# Patient Record
Sex: Female | Born: 1990 | Race: Black or African American | Hispanic: No | Marital: Married | Smoking: Never smoker
Health system: Southern US, Community
[De-identification: ages and names within clinical notes are randomized; demographics above are authoritative.]

## PROBLEM LIST (undated history)

## (undated) ENCOUNTER — Inpatient Hospital Stay (HOSPITAL_COMMUNITY): Payer: Self-pay

## (undated) DIAGNOSIS — K219 Gastro-esophageal reflux disease without esophagitis: Secondary | ICD-10-CM

## (undated) DIAGNOSIS — I10 Essential (primary) hypertension: Secondary | ICD-10-CM

## (undated) DIAGNOSIS — Z789 Other specified health status: Secondary | ICD-10-CM

## (undated) DIAGNOSIS — E611 Iron deficiency: Secondary | ICD-10-CM

## (undated) DIAGNOSIS — M5430 Sciatica, unspecified side: Secondary | ICD-10-CM

## (undated) DIAGNOSIS — D649 Anemia, unspecified: Secondary | ICD-10-CM

## (undated) DIAGNOSIS — Z9289 Personal history of other medical treatment: Secondary | ICD-10-CM

## (undated) HISTORY — DX: Iron deficiency: E61.1

## (undated) HISTORY — PX: WISDOM TOOTH EXTRACTION: SHX21

## (undated) HISTORY — DX: Sciatica, unspecified side: M54.30

## (undated) HISTORY — DX: Anemia, unspecified: D64.9

## (undated) HISTORY — DX: Personal history of other medical treatment: Z92.89

## (undated) HISTORY — DX: Gastro-esophageal reflux disease without esophagitis: K21.9

## (undated) HISTORY — PX: TUBAL LIGATION: SHX77

---
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
## 2009-11-25 DIAGNOSIS — A749 Chlamydial infection, unspecified: Secondary | ICD-10-CM

## 2009-11-25 HISTORY — DX: Chlamydial infection, unspecified: A74.9

## 2014-05-26 ENCOUNTER — Encounter (HOSPITAL_COMMUNITY): Payer: Self-pay | Admitting: Emergency Medicine

## 2014-05-26 ENCOUNTER — Emergency Department (HOSPITAL_COMMUNITY)
Admission: EM | Admit: 2014-05-26 | Discharge: 2014-05-26 | Disposition: A | Discharge status: Home / Self Care | Payer: BC Managed Care – PPO | Attending: Emergency Medicine | Admitting: Emergency Medicine

## 2014-05-26 DIAGNOSIS — K5289 Other specified noninfective gastroenteritis and colitis: Secondary | ICD-10-CM | POA: Insufficient documentation

## 2014-05-26 DIAGNOSIS — Z79899 Other long term (current) drug therapy: Secondary | ICD-10-CM | POA: Insufficient documentation

## 2014-05-26 DIAGNOSIS — K529 Noninfective gastroenteritis and colitis, unspecified: Secondary | ICD-10-CM

## 2014-05-26 DIAGNOSIS — Z3202 Encounter for pregnancy test, result negative: Secondary | ICD-10-CM | POA: Insufficient documentation

## 2014-05-26 LAB — URINALYSIS, ROUTINE W REFLEX MICROSCOPIC
Bilirubin Urine: NEGATIVE
Glucose, UA: NEGATIVE mg/dL
Hgb urine dipstick: NEGATIVE
Ketones, ur: NEGATIVE mg/dL
Leukocytes, UA: NEGATIVE
Nitrite: NEGATIVE
Protein, ur: 30 mg/dL — AB
Specific Gravity, Urine: 1.031 — ABNORMAL HIGH (ref 1.005–1.030)
Urobilinogen, UA: 0.2 mg/dL (ref 0.0–1.0)
pH: 6 (ref 5.0–8.0)

## 2014-05-26 LAB — CBC WITH DIFFERENTIAL/PLATELET
Basophils Absolute: 0 10*3/uL (ref 0.0–0.1)
Basophils Relative: 0 % (ref 0–1)
Eosinophils Absolute: 0 10*3/uL (ref 0.0–0.7)
Eosinophils Relative: 0 % (ref 0–5)
HCT: 37.8 % (ref 36.0–46.0)
Hemoglobin: 12.6 g/dL (ref 12.0–15.0)
Lymphocytes Relative: 11 % — ABNORMAL LOW (ref 12–46)
Lymphs Abs: 1.1 10*3/uL (ref 0.7–4.0)
MCH: 29.7 pg (ref 26.0–34.0)
MCHC: 33.3 g/dL (ref 30.0–36.0)
MCV: 89.2 fL (ref 78.0–100.0)
Monocytes Absolute: 0.6 10*3/uL (ref 0.1–1.0)
Monocytes Relative: 6 % (ref 3–12)
Neutro Abs: 8 10*3/uL — ABNORMAL HIGH (ref 1.7–7.7)
Neutrophils Relative %: 83 % — ABNORMAL HIGH (ref 43–77)
Platelets: 269 10*3/uL (ref 150–400)
RBC: 4.24 MIL/uL (ref 3.87–5.11)
RDW: 12.8 % (ref 11.5–15.5)
WBC: 9.6 10*3/uL (ref 4.0–10.5)

## 2014-05-26 LAB — POC URINE PREG, ED: Preg Test, Ur: NEGATIVE

## 2014-05-26 LAB — COMPREHENSIVE METABOLIC PANEL
ALT: 9 U/L (ref 0–35)
AST: 15 U/L (ref 0–37)
Albumin: 3.5 g/dL (ref 3.5–5.2)
Alkaline Phosphatase: 62 U/L (ref 39–117)
Anion gap: 14 (ref 5–15)
BUN: 11 mg/dL (ref 6–23)
CO2: 24 mEq/L (ref 19–32)
Calcium: 9.1 mg/dL (ref 8.4–10.5)
Chloride: 101 mEq/L (ref 96–112)
Creatinine, Ser: 0.89 mg/dL (ref 0.50–1.10)
GFR calc Af Amer: >90 mL/min (ref >90)
GFR calc non Af Amer: 89 mL/min — ABNORMAL LOW (ref >90)
Glucose, Bld: 96 mg/dL (ref 70–99)
Potassium: 4.2 mEq/L (ref 3.7–5.3)
Sodium: 139 mEq/L (ref 137–147)
Total Bilirubin: 0.3 mg/dL (ref 0.3–1.2)
Total Protein: 7.7 g/dL (ref 6.0–8.3)

## 2014-05-26 LAB — LIPASE, BLOOD: Lipase: 23 U/L (ref 11–59)

## 2014-05-26 LAB — URINE MICROSCOPIC-ADD ON

## 2014-05-26 MED ORDER — ONDANSETRON HCL 4 MG/2ML IJ SOLN
4.0000 mg | Freq: Once | INTRAMUSCULAR | Status: AC
  Administered 2014-05-26: 4 mg via INTRAVENOUS
  Filled 2014-05-26: qty 2

## 2014-05-26 MED ORDER — SODIUM CHLORIDE 0.9 % IV BOLUS (SEPSIS)
1000.0000 mL | Freq: Once | INTRAVENOUS | Status: AC
  Administered 2014-05-26: 1000 mL via INTRAVENOUS

## 2014-05-26 MED ORDER — DICYCLOMINE HCL 20 MG PO TABS: 20.0000 mg | ORAL_TABLET | Freq: Two times a day (BID) | ORAL | Status: DC

## 2014-05-26 MED ORDER — ONDANSETRON HCL 4 MG/2ML IJ SOLN: 4.0000 mg | Freq: Once | INTRAMUSCULAR | Status: DC

## 2014-05-26 MED ORDER — PROMETHAZINE HCL 25 MG PO TABS: 25.0000 mg | ORAL_TABLET | Freq: Four times a day (QID) | ORAL | Status: DC | PRN

## 2014-05-26 MED ORDER — MORPHINE SULFATE 4 MG/ML IJ SOLN
4.0000 mg | Freq: Once | INTRAMUSCULAR | Status: AC
  Administered 2014-05-26: 4 mg via INTRAVENOUS
  Filled 2014-05-26: qty 1

## 2014-05-26 NOTE — ED Provider Notes (Signed)
CSN: [id number]     Arrival date & time 05/26/14  0543 History   First MD Initiated Contact with Patient 05/26/14 0600     Chief Complaint  Patient presents with  . Emesis  . Diarrhea     (Consider location/radiation/quality/duration/timing/severity/associated sxs/prior Treatment) HPI Patient to the ER for evaluation of epigastric pain, nausea, vomiting, and diarrhea starting 1 hour after eating at a local Tavern last night. She had a chicken and philly cheese steak with fries. No one else in her group ate that meal and no one else was sick after there restaurant. She reports first having vomiting and then a few hours after starting to have diarrhea. This morning she reports no longer having diarrhea but she continues to feel nauseous and has been unable to keep down any fluids. She reports that she is healthy at baseline, takes no daily medications, and has not tried anything for her symptoms at home. Denies having abdominal pain at this time. Temp is 98.6, pulse 91, resp 16, BP 145/70, O2 is 100%  History reviewed. No pertinent past medical history. History reviewed. No pertinent past surgical history. No family history on file. History  Substance Use Topics  . Smoking status: Never Smoker   . Smokeless tobacco: Never Used  . Alcohol Use: No   OB History   Grav Para Term Preterm Abortions TAB SAB Ect Mult Living                 Review of Systems   Review of Systems  Gen: no weight loss, fevers, chills, night sweats  Eyes: no discharge or drainage, no occular pain or visual changes  Nose: no epistaxis or rhinorrhea  Mouth: no dental pain, no sore throat  Neck: no neck pain  Lungs:No wheezing, coughing or hemoptysis CV: no chest pain, palpitations, dependent edema or orthopnea  Abd: + abdominal pain, nausea, vomiting, diarrhea GU: no dysuria or gross hematuria  MSK:  No muscle weakness or pain Neuro: no headache, no focal neurologic deficits  Skin: no rash or wounds Psyche:  no complaints    Allergies  Review of patient's allergies indicates no known allergies.  Home Medications   Prior to Admission medications   Medication Sig Start Date End Date Taking? Authorizing Provider  norethindrone-ethinyl estradiol (JUNEL FE,GILDESS FE,LOESTRIN FE) 1-20 MG-MCG tablet Take 1 tablet by mouth daily.   Yes Historical Provider, MD  dicyclomine (BENTYL) 20 MG tablet Take 1 tablet (20 mg total) by mouth 2 (two) times daily. 05/26/14   Lateshia Schmoker Irine SealG Jusiah Aguayo, PA-C  promethazine (PHENERGAN) 25 MG tablet Take 1 tablet (25 mg total) by mouth every 6 (six) hours as needed for nausea or vomiting. 05/26/14   Maryon Kemnitz Irine SealG Jaleena Viviani, PA-C   BP 122/70  Pulse 80  Temp(Src) 98.6 F (37 C) (Oral)  Resp 23  Ht 5\' 4"  (1.626 m)  Wt 205 lb (92.987 kg)  BMI 35.17 kg/m2  SpO2 100%  LMP 04/15/2014 Physical Exam  Nursing note and vitals reviewed. Constitutional: She appears well-developed and well-nourished. No distress.  HENT:  Head: Normocephalic and atraumatic.  Eyes: Pupils are equal, round, and reactive to light.  Neck: Normal range of motion. Neck supple.  Cardiovascular: Normal rate and regular rhythm.   Pulmonary/Chest: Effort normal.  Abdominal: Soft. Bowel sounds are normal. She exhibits no distension, no fluid wave and no ascites. There is no tenderness. There is no rigidity, no guarding, no CVA tenderness and negative Murphy's sign.  Abdomen is soft and NT  Neurological: She is alert.  Skin: Skin is warm and dry.    ED Course  Procedures (including critical care time) Labs Review Labs Reviewed  CBC WITH DIFFERENTIAL - Abnormal; Notable for the following:    Neutrophils Relative % 83 (*)    Neutro Abs 8.0 (*)    Lymphocytes Relative 11 (*)    All other components within normal limits  COMPREHENSIVE METABOLIC PANEL - Abnormal; Notable for the following:    GFR calc non Af Amer 89 (*)    All other components within normal limits  URINALYSIS, ROUTINE W REFLEX MICROSCOPIC -  Abnormal; Notable for the following:    APPearance CLOUDY (*)    Specific Gravity, Urine 1.031 (*)    Protein, ur 30 (*)    All other components within normal limits  URINE MICROSCOPIC-ADD ON - Abnormal; Notable for the following:    Squamous Epithelial / LPF FEW (*)    All other components within normal limits  LIPASE, BLOOD  POC URINE PREG, ED    Imaging Review No results found.   EKG Interpretation None      MDM   Final diagnoses:  Gastroenteritis    Medications  sodium chloride 0.9 % bolus 1,000 mL (1,000 mLs Intravenous New Bag/Given 05/26/14 0629)  ondansetron (ZOFRAN) injection 4 mg (not administered)  ondansetron (ZOFRAN) injection 4 mg (4 mg Intravenous Given 05/26/14 0629)  morphine 4 MG/ML injection 4 mg (4 mg Intravenous Given 05/26/14 0629)   6:21 am Patients symptoms are consistent with gastroenteritis. Will initiate symptomatic control and check labs.  7:27 am Patient feeling better after the Zofran and fluids. She was given a cup of water which she has been able to drink without feeling nauseous or vomiting. She reports that she no longer feels like her stomach is turning.  Referral to PCP or GI.  dicyclomine (BENTYL) 20 MG tablet Take 1 tablet (20 mg total) by mouth 2 (two) times daily. 20 tablet Dorthula Matasiffany G Emmons Toth, PA-C   promethazine (PHENERGAN) 25 MG tablet Take 1 tablet (25 mg total) by mouth every 6 (six) hours as needed for nausea or vomiting. 30 tablet Dorthula Matasiffany G Gracelynn Bircher, PA-C  23 y.o.Nairi E Stage's evaluation in the Emergency Department is complete. It has been determined that no acute conditions requiring further emergency intervention are present at this time. The patient/guardian have been advised of the diagnosis and plan. We have discussed signs and symptoms that warrant return to the ED, such as changes or worsening in symptoms.  Vital signs are stable at discharge. Filed Vitals:   05/26/14 0700  BP: 122/70  Pulse: 80  Temp:   Resp: 23     Patient/guardian has voiced understanding and agreed to follow-up with the PCP or specialist.   Dorthula Matasiffany G Fonda Rochon, PA-C 05/26/14 [phone]

## 2014-05-26 NOTE — ED Notes (Signed)
Pt states that last night around 9p she began vomiting and having diarrhea. vomitx4 diarrheax1. States that it began after eating at [employer].

## 2014-05-26 NOTE — Discharge Instructions (Signed)

## 2014-05-26 NOTE — ED Notes (Signed)
Gave a cup of water to Patient.  Patient stated she felt better. Doing well drinking the water.

## 2014-05-30 ENCOUNTER — Encounter: Payer: Self-pay | Admitting: Internal Medicine

## 2014-05-30 NOTE — ED Provider Notes (Signed)
Medical screening examination/treatment/procedure(s) were performed by non-physician practitioner and as supervising physician I was immediately available for consultation/collaboration.   EKG Interpretation None        Shon Batonourtney F Horton, MD 05/30/14 1601

## 2014-08-03 ENCOUNTER — Ambulatory Visit: Payer: BC Managed Care – PPO | Admitting: Internal Medicine

## 2015-01-24 ENCOUNTER — Emergency Department: Payer: Self-pay | Admitting: Internal Medicine

## 2015-02-10 ENCOUNTER — Encounter (HOSPITAL_BASED_OUTPATIENT_CLINIC_OR_DEPARTMENT_OTHER): Payer: Self-pay

## 2015-02-10 ENCOUNTER — Emergency Department (HOSPITAL_BASED_OUTPATIENT_CLINIC_OR_DEPARTMENT_OTHER)
Admission: EM | Admit: 2015-02-10 | Discharge: 2015-02-10 | Disposition: A | Discharge status: Home / Self Care | Payer: Self-pay | Attending: Emergency Medicine | Admitting: Emergency Medicine

## 2015-02-10 DIAGNOSIS — Z79899 Other long term (current) drug therapy: Secondary | ICD-10-CM | POA: Insufficient documentation

## 2015-02-10 DIAGNOSIS — Z793 Long term (current) use of hormonal contraceptives: Secondary | ICD-10-CM | POA: Insufficient documentation

## 2015-02-10 DIAGNOSIS — N3001 Acute cystitis with hematuria: Secondary | ICD-10-CM | POA: Insufficient documentation

## 2015-02-10 DIAGNOSIS — Z3202 Encounter for pregnancy test, result negative: Secondary | ICD-10-CM | POA: Insufficient documentation

## 2015-02-10 LAB — URINALYSIS, ROUTINE W REFLEX MICROSCOPIC
Bilirubin Urine: NEGATIVE
Glucose, UA: NEGATIVE mg/dL
Ketones, ur: NEGATIVE mg/dL
Nitrite: NEGATIVE
Protein, ur: 30 mg/dL — AB
Specific Gravity, Urine: 1.025 (ref 1.005–1.030)
Urobilinogen, UA: 1 mg/dL (ref 0.0–1.0)
pH: 7 (ref 5.0–8.0)

## 2015-02-10 LAB — URINE MICROSCOPIC-ADD ON

## 2015-02-10 LAB — PREGNANCY, URINE: Preg Test, Ur: NEGATIVE

## 2015-02-10 MED ORDER — SULFAMETHOXAZOLE-TRIMETHOPRIM 800-160 MG PO TABS: 1.0000 | ORAL_TABLET | Freq: Two times a day (BID) | ORAL | Status: DC

## 2015-02-10 NOTE — Discharge Instructions (Signed)

## 2015-02-10 NOTE — ED Notes (Signed)
Pt reports pressure when she urinates. Denies pain. Reports frequency.

## 2015-02-10 NOTE — ED Provider Notes (Signed)
CSN: [id number]     Arrival date & time 02/10/15  1050 History   First MD Initiated Contact with Patient 02/10/15 1109     Chief Complaint  Patient presents with  . Urinary Frequency     (Consider location/radiation/quality/duration/timing/severity/associated sxs/prior Treatment) Patient is a 24 y.o. female presenting with frequency. The history is provided by the patient.  Urinary Frequency This is a new problem. Pertinent negatives include no chest pain, no abdominal pain, no headaches and no shortness of breath.   patient's had some urinary frequency for the last week or 2 and began to have some dysuria around a week ago. No fevers. States she's not had urinary tract infection before but thinks this may be what that is. No vaginal discharge. no fevers. Slight abdominal pain. She denies possibility of pregnancy. States she has had slight blood with wiping after urination.   History reviewed. No pertinent past medical history. History reviewed. No pertinent past surgical history. History reviewed. No pertinent family history. History  Substance Use Topics  . Smoking status: Never Smoker   . Smokeless tobacco: Never Used  . Alcohol Use: No   OB History    No data available     Review of Systems  Constitutional: Negative for fever and activity change.  Respiratory: Negative for chest tightness and shortness of breath.   Cardiovascular: Negative for chest pain and leg swelling.  Gastrointestinal: Negative for nausea and abdominal pain.  Genitourinary: Positive for frequency. Negative for flank pain.  Musculoskeletal: Negative for back pain.  Neurological: Negative for weakness and headaches.  Psychiatric/Behavioral: Negative for behavioral problems.      Allergies  Review of patient's allergies indicates no known allergies.  Home Medications   Prior to Admission medications   Medication Sig Start Date End Date Taking? Authorizing Provider  dicyclomine (BENTYL) 20 MG  tablet Take 1 tablet (20 mg total) by mouth 2 (two) times daily. 05/26/14   Marlon Pel, PA-C  norethindrone-ethinyl estradiol (JUNEL FE,GILDESS FE,LOESTRIN FE) 1-20 MG-MCG tablet Take 1 tablet by mouth daily.    Historical Provider, MD  promethazine (PHENERGAN) 25 MG tablet Take 1 tablet (25 mg total) by mouth every 6 (six) hours as needed for nausea or vomiting. 05/26/14   Tiffany Neva Seat, PA-C  sulfamethoxazole-trimethoprim (BACTRIM DS,SEPTRA DS) 800-160 MG per tablet Take 1 tablet by mouth 2 (two) times daily. 02/10/15   Benjiman Core, MD   BP 130/74 mmHg  Pulse 88  Temp(Src) 98.7 F (37.1 C) (Oral)  Resp 18  Ht  (1.626 m)  Wt 210 lb (95.255 kg)  BMI 36.03 kg/m2  SpO2 100%  LMP 01/09/2015 Physical Exam  Constitutional: She appears well-developed.  HENT:  Head: Normocephalic.  Cardiovascular: Normal rate and regular rhythm.   Pulmonary/Chest: Effort normal.  Abdominal: There is no tenderness.  Genitourinary:  No CVA tenderness.   Neurological: She is alert.    ED Course  Procedures (including critical care time) Labs Review Labs Reviewed  URINALYSIS, ROUTINE W REFLEX MICROSCOPIC - Abnormal; Notable for the following:    APPearance CLOUDY (*)    Hgb urine dipstick MODERATE (*)    Protein, ur 30 (*)    Leukocytes, UA LARGE (*)    All other components within normal limits  URINE MICROSCOPIC-ADD ON - Abnormal; Notable for the following:    Bacteria, UA FEW (*)    All other components within normal limits  PREGNANCY, URINE    Imaging Review No results found.   EKG Interpretation None  MDM   Final diagnoses:  Acute cystitis with hematuria    Patient with likely UTI. Will treat. Benign exam.    Benjiman CoreNathan Akisha Sturgill, MD 02/10/15 1214

## 2015-02-10 NOTE — ED Notes (Signed)
MD at bedside. 

## 2015-02-10 NOTE — ED Notes (Signed)
Pt reports urinary frequency x1 week with lower abdominal pressure.

## 2015-06-29 ENCOUNTER — Emergency Department (HOSPITAL_COMMUNITY)
Admission: EM | Admit: 2015-06-29 | Discharge: 2015-06-29 | Disposition: A | Discharge status: Home / Self Care | Payer: Self-pay | Attending: Emergency Medicine | Admitting: Emergency Medicine

## 2015-06-29 ENCOUNTER — Encounter (HOSPITAL_COMMUNITY): Payer: Self-pay | Admitting: Vascular Surgery

## 2015-06-29 DIAGNOSIS — M25471 Effusion, right ankle: Secondary | ICD-10-CM | POA: Insufficient documentation

## 2015-06-29 DIAGNOSIS — R609 Edema, unspecified: Secondary | ICD-10-CM

## 2015-06-29 DIAGNOSIS — M25475 Effusion, left foot: Secondary | ICD-10-CM | POA: Insufficient documentation

## 2015-06-29 DIAGNOSIS — Z79899 Other long term (current) drug therapy: Secondary | ICD-10-CM | POA: Insufficient documentation

## 2015-06-29 DIAGNOSIS — M25472 Effusion, left ankle: Secondary | ICD-10-CM | POA: Insufficient documentation

## 2015-06-29 DIAGNOSIS — M25474 Effusion, right foot: Secondary | ICD-10-CM | POA: Insufficient documentation

## 2015-06-29 NOTE — ED Provider Notes (Signed)
CSN: [id number]     Arrival date & time 06/29/15  2055 History  This chart was scribed for non-physician practitioner, Sharilyn Sites, PA-C, working with Glynn Octave, MD, by Ronney Lion, ED Scribe. This patient was seen in room TR03C/TR03C and the patient's care was started at 9:15 PM.    Chief Complaint  Patient presents with  . Joint Swelling   The history is provided by the patient. No language interpreter was used.    HPI Comments: Paula [name] is a 24 y.o. female who presents to the Emergency Department complaining of bilateral feet and ankle swelling that began several days ago. She states the swelling doesn't hurt, but her feet and ankles feel stiff when she stands from a sitting position.  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 She states she has had similar episodes of swelling in the past, but they usually resolve after a few days; her symptoms have been more persistent this time around. She states she has only been propping her legs up when she goes to sleep. She notes her grandmother had a history of CHF. She denies difficulty breathing or chest pain.  Patient has no hx of cardiac or renal issues.  No hx of DVT or PE.  No fever, chills, sweats.  VSS.  History reviewed. No pertinent past medical history. History reviewed. No pertinent past surgical history. No family history on file. History  Substance Use Topics  . Smoking status: Never Smoker   . Smokeless tobacco: Never Used  . Alcohol Use: No   OB History    No data available     Review of Systems  Respiratory: Negative for shortness of breath.   Cardiovascular: Positive for leg swelling. Negative for chest pain.  All other systems reviewed and are negative.  Allergies  Review of patient's allergies indicates no known allergies.  Home Medications   Prior to Admission medications   Medication Sig Start Date End Date Taking? Authorizing Provider  dicyclomine (BENTYL) 20 MG tablet Take 1  tablet (20 mg total) by mouth 2 (two) times daily. 05/26/14   Marlon Pel, PA-C  norethindrone-ethinyl estradiol (JUNEL FE,GILDESS FE,LOESTRIN FE) 1-20 MG-MCG tablet Take 1 tablet by mouth daily.    Historical Provider, MD  promethazine (PHENERGAN) 25 MG tablet Take 1 tablet (25 mg total) by mouth every 6 (six) hours as needed for nausea or vomiting. 05/26/14   Tiffany Neva Seat, PA-C  sulfamethoxazole-trimethoprim (BACTRIM DS,SEPTRA DS) 800-160 MG per tablet Take 1 tablet by mouth 2 (two) times daily. 02/10/15   Benjiman Core, MD   BP 151/70 mmHg  Pulse 78  Temp(Src) 98.2 F (36.8 C) (Oral)  Resp 16  Ht  (1.626 m)  Wt 216 lb (97.977 kg)  BMI 37.06 kg/m2  SpO2 99%  LMP 05/31/2015   Physical Exam  Constitutional: She is oriented to person, place, and time. She appears well-developed and well-nourished. No distress.  HENT:  Head: Normocephalic and atraumatic.  Mouth/Throat: Oropharynx is clear and moist.  Eyes: Conjunctivae and EOM are normal. Pupils are equal, round, and reactive to light.  Neck: Normal range of motion. Neck supple.  Cardiovascular: Normal rate, regular rhythm and normal heart sounds.   Pulmonary/Chest: Effort normal and breath sounds normal. No respiratory distress. She has no wheezes.  Musculoskeletal: Normal range of motion. She exhibits no edema.  Edema noted to bilateral ankles; no pitting; no bony deformities or signs of trauma No calf asymmetry, tenderness, or  palpable cords; no overlying skin changes or warmth to touch Both legs are NVI, legs well perfused   Neurological: She is alert and oriented to person, place, and time.  Skin: Skin is warm and dry. She is not diaphoretic.  Psychiatric: She has a normal mood and affect.  Nursing note and vitals reviewed.   ED Course  Procedures (including critical care time)  DIAGNOSTIC STUDIES: Oxygen Saturation is 99% on RA, normal by my interpretation.    COORDINATION OF CARE: 9:19 PM - Doubt blood clot or  heart failure. Suspect dependent edema. Discussed treatment plan with pt at bedside which includes periodically elevating her feet up at home to reduce swelling, and use of compression socks. Strict return precautions given, and pt instructed to return if she develops chest pain or SOB. Pt verbalized understanding and agreed to plan.     MDM   Final diagnoses:  Dependent edema   24 year old female here with bilateral ankle swelling for the past 3 days. She works at a desk daily. She states history of the same, but usually resolves the next day. She denies any chest pain, shortness of breath, calf pain, or ankle pain. No noted injuries or trauma. Edema is generalized throughout bilateral ankles, no pitting edema noted. Patient does not appear fluid overloaded.  No cardiac or renal history.  No clinical signs concerning for DVT. Both legs are neurovascularly intact and appear well-perfused. I suspect this is dependent edema.  Encouraged patient to elevate her legs throughout the day and in the evenings, also recommended compression stockings.  FU with PCP.  Discussed plan with patient, he/she acknowledged understanding and agreed with plan of care.  Return precautions given for new or worsening symptoms.  I personally performed the services described in this documentation, which was scribed in my presence. The recorded information has been reviewed and is accurate.  Garlon Hatchet, PA-C 06/29/15 2159  Glynn Octave, MD 06/29/15 [phone]

## 2015-06-29 NOTE — Discharge Instructions (Signed)
Recommend to elevate feet above level of heart. May wear compression stockings to help with swelling as well.

## 2015-06-29 NOTE — ED Notes (Signed)
Pt reports to the ED for eval of bilateral feet and ankle swelling x several days. 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 Denies any injury to her feet or legs. Swelling noted bilaterally. CMS intact. Denies any SOB or CP. No pain in her feet, legs, or ankle. She reports she has had this happen before and it will usually resolve after a few days. Denies any hx of cardiac or renal problems. Pt A&Ox4, resp e/u, and skin warm and dry.

## 2015-10-12 ENCOUNTER — Inpatient Hospital Stay (HOSPITAL_COMMUNITY)
Admission: AD | Admit: 2015-10-12 | Discharge: 2015-10-12 | Disposition: A | Discharge status: Home / Self Care | Payer: Medicaid Other | Source: Ambulatory Visit | Attending: Family Medicine | Admitting: Family Medicine

## 2015-10-12 ENCOUNTER — Inpatient Hospital Stay (HOSPITAL_COMMUNITY): Payer: Medicaid Other

## 2015-10-12 ENCOUNTER — Encounter (HOSPITAL_COMMUNITY): Payer: Self-pay | Admitting: *Deleted

## 2015-10-12 DIAGNOSIS — Z3491 Encounter for supervision of normal pregnancy, unspecified, first trimester: Secondary | ICD-10-CM

## 2015-10-12 DIAGNOSIS — R102 Pelvic and perineal pain: Secondary | ICD-10-CM | POA: Insufficient documentation

## 2015-10-12 DIAGNOSIS — Z3A13 13 weeks gestation of pregnancy: Secondary | ICD-10-CM | POA: Diagnosis not present

## 2015-10-12 DIAGNOSIS — O26891 Other specified pregnancy related conditions, first trimester: Secondary | ICD-10-CM | POA: Insufficient documentation

## 2015-10-12 LAB — HCG, QUANTITATIVE, PREGNANCY: hCG, Beta Chain, Quant, S: 2079 m[IU]/mL — ABNORMAL HIGH (ref <5)

## 2015-10-12 MED ORDER — PRENATAL VITAMINS 28-0.8 MG PO TABS: 1.0000 | ORAL_TABLET | Freq: Every day | ORAL | Status: DC

## 2015-10-12 NOTE — MAU Note (Signed)
Pt had more questions. Talked with L.Leftwich,CNM U/S ordered.

## 2015-10-12 NOTE — Discharge Instructions (Signed)
First Trimester of Pregnancy The first trimester of pregnancy is from week 1 until the end of week 12 (months 1 through 3). A week after a sperm fertilizes an egg, the egg will implant on the wall of the uterus. This embryo will begin to develop into a baby. Genes from you and your partner are forming the baby. The female genes determine whether the baby is a boy or a girl. At 6-8 weeks, the eyes and face are formed, and the heartbeat can be seen on ultrasound. At the end of 12 weeks, all the baby's organs are formed.  Now that you are pregnant, you will want to do everything you can to have a healthy baby. Two of the most important things are to get good prenatal care and to follow your health care provider's instructions. Prenatal care is all the medical care you receive before the baby's birth. This care will help prevent, find, and treat any problems during the pregnancy and childbirth. BODY CHANGES Your body goes through many changes during pregnancy. The changes vary from woman to woman.   You may gain or lose a couple of pounds at first.  You may feel sick to your stomach (nauseous) and throw up (vomit). If the vomiting is uncontrollable, call your health care provider.  You may tire easily.  You may develop headaches that can be relieved by medicines approved by your health care provider.  You may urinate more often. Painful urination may mean you have a bladder infection.  You may develop heartburn as a result of your pregnancy.  You may develop constipation because certain hormones are causing the muscles that push waste through your intestines to slow down.  You may develop hemorrhoids or swollen, bulging veins (varicose veins).  Your breasts may begin to grow larger and become tender. Your nipples may stick out more, and the tissue that surrounds them (areola) may become darker.  Your gums may bleed and may be sensitive to brushing and flossing.  Dark spots or blotches (chloasma,  mask of pregnancy) may develop on your face. This will likely fade after the baby is born.  Your menstrual periods will stop.  You may have a loss of appetite.  You may develop cravings for certain kinds of food.  You may have changes in your emotions from day to day, such as being excited to be pregnant or being concerned that something may go wrong with the pregnancy and baby.  You may have more vivid and strange dreams.  You may have changes in your hair. These can include thickening of your hair, rapid growth, and changes in texture. Some women also have hair loss during or after pregnancy, or hair that feels dry or thin. Your hair will most likely return to normal after your baby is born. WHAT TO EXPECT AT YOUR PRENATAL VISITS During a routine prenatal visit:  You will be weighed to make sure you and the baby are growing normally.  Your blood pressure will be taken.  Your abdomen will be measured to track your baby's growth.  The fetal heartbeat will be listened to starting around week 10 or 12 of your pregnancy.  Test results from any previous visits will be discussed. Your health care provider may ask you:  How you are feeling.  If you are feeling the baby move.  If you have had any abnormal symptoms, such as leaking fluid, bleeding, severe headaches, or abdominal cramping.  If you are using any tobacco products,   including cigarettes, chewing tobacco, and electronic cigarettes.  If you have any questions. Other tests that may be performed during your first trimester include:  Blood tests to find your blood type and to check for the presence of any previous infections. They will also be used to check for low iron levels (anemia) and Rh antibodies. Later in the pregnancy, blood tests for diabetes will be done along with other tests if problems develop.  Urine tests to check for infections, diabetes, or protein in the urine.  An ultrasound to confirm the proper growth  and development of the baby.  An amniocentesis to check for possible genetic problems.  Fetal screens for spina bifida and Down syndrome.  You may need other tests to make sure you and the baby are doing well.  HIV (human immunodeficiency virus) testing. Routine prenatal testing includes screening for HIV, unless you choose not to have this test. HOME CARE INSTRUCTIONS  Medicines  Follow your health care provider's instructions regarding medicine use. Specific medicines may be either safe or unsafe to take during pregnancy.  Take your prenatal vitamins as directed.  If you develop constipation, try taking a stool softener if your health care provider approves. Diet  Eat regular, well-balanced meals. Choose a variety of foods, such as meat or vegetable-based protein, fish, milk and low-fat dairy products, vegetables, fruits, and whole grain breads and cereals. Your health care provider will help you determine the amount of weight gain that is right for you.  Avoid raw meat and uncooked cheese. These carry germs that can cause birth defects in the baby.  Eating four or five small meals rather than three large meals a day may help relieve nausea and vomiting. If you start to feel nauseous, eating a few soda crackers can be helpful. Drinking liquids between meals instead of during meals also seems to help nausea and vomiting.  If you develop constipation, eat more high-fiber foods, such as fresh vegetables or fruit and whole grains. Drink enough fluids to keep your urine clear or pale yellow. Activity and Exercise  Exercise only as directed by your health care provider. Exercising will help you:  Control your weight.  Stay in shape.  Be prepared for labor and delivery.  Experiencing pain or cramping in the lower abdomen or low back is a good sign that you should stop exercising. Check with your health care provider before continuing normal exercises.  Try to avoid standing for long  periods of time. Move your legs often if you must stand in one place for a long time.  Avoid heavy lifting.  Wear low-heeled shoes, and practice good posture.  You may continue to have sex unless your health care provider directs you otherwise. Relief of Pain or Discomfort  Wear a good support bra for breast tenderness.   Take warm sitz baths to soothe any pain or discomfort caused by hemorrhoids. Use hemorrhoid cream if your health care provider approves.   Rest with your legs elevated if you have leg cramps or low back pain.  If you develop varicose veins in your legs, wear support hose. Elevate your feet for 15 minutes, 3-4 times a day. Limit salt in your diet. Prenatal Care  Schedule your prenatal visits by the twelfth week of pregnancy. They are usually scheduled monthly at first, then more often in the last 2 months before delivery.  Write down your questions. Take them to your prenatal visits.  Keep all your prenatal visits as directed by your   health care provider. Safety  Wear your seat belt at all times when driving.  Make a list of emergency phone numbers, including numbers for family, friends, the hospital, and police and fire departments. General Tips  Ask your health care provider for a referral to a local prenatal education class. Begin classes no later than at the beginning of month 6 of your pregnancy.  Ask for help if you have counseling or nutritional needs during pregnancy. Your health care provider can offer advice or refer you to specialists for help with various needs.  Do not use hot tubs, steam rooms, or saunas.  Do not douche or use tampons or scented sanitary pads.  Do not cross your legs for long periods of time.  Avoid cat litter boxes and soil used by cats. These carry germs that can cause birth defects in the baby and possibly loss of the fetus by miscarriage or stillbirth.  Avoid all smoking, herbs, alcohol, and medicines not prescribed by  your health care provider. Chemicals in these affect the formation and growth of the baby.  Do not use any tobacco products, including cigarettes, chewing tobacco, and electronic cigarettes. If you need help quitting, ask your health care provider. You may receive counseling support and other resources to help you quit.  Schedule a dentist appointment. At home, brush your teeth with a soft toothbrush and be gentle when you floss. SEEK MEDICAL CARE IF:   You have dizziness.  You have mild pelvic cramps, pelvic pressure, or nagging pain in the abdominal area.  You have persistent nausea, vomiting, or diarrhea.  You have a bad smelling vaginal discharge.  You have pain with urination.  You notice increased swelling in your face, hands, legs, or ankles. SEEK IMMEDIATE MEDICAL CARE IF:   You have a fever.  You are leaking fluid from your vagina.  You have spotting or bleeding from your vagina.  You have severe abdominal cramping or pain.  You have rapid weight gain or loss.  You vomit blood or material that looks like coffee grounds.  You are exposed to German measles and have never had them.  You are exposed to fifth disease or chickenpox.  You develop a severe headache.  You have shortness of breath.  You have any kind of trauma, such as from a fall or a car accident.   This information is not intended to replace advice given to you by your health care provider. Make sure you discuss any questions you have with your health care provider.   Document Released: 11/05/2001 Document Revised: 12/02/2014 Document Reviewed: 09/21/2013 Elsevier Interactive Patient Education 2016 [employer]  

## 2015-10-12 NOTE — MAU Provider Note (Signed)
Chief Complaint: Follow-up   None     SUBJECTIVE HPI: Paula [name] is a 24 y.o. G1P0 at [redacted]w[redacted]d by LMP who presents to maternity admissions reporting she needs a follow up quant hcg.  She presented to an emergency room in [place] 3 days ago with abdominal cramping in pregnancy, described as intermittent, mild, lower abdominal pain x 1-2 days.  She initially denied pain in MAU but after talking with her friend in the waiting room, reported she did still have mild intermittent cramping. She has not tried anything for pain. Quant hcg in [place] was 660, and she has printed documentation of her visit with lab results. She denies vaginal bleeding, vaginal itching/burning, urinary symptoms, h/a, dizziness, n/v, or fever/chills.     Abdominal Pain This is a new problem. The current episode started in the past 7 days. The onset quality is gradual. The problem occurs intermittently. The most recent episode lasted 5 days. The problem has been waxing and waning. The pain is located in the LLQ and RLQ. The pain is mild. The quality of the pain is cramping. The abdominal pain does not radiate. Pertinent negatives include no constipation, diarrhea, dysuria, fever, frequency, headaches, nausea or vomiting. Nothing aggravates the pain. The pain is relieved by nothing. She has tried nothing for the symptoms.    No past medical history on file. No past surgical history on file. Social History   Social History  . Marital Status: Single    Spouse Name: N/A  . Number of Children: N/A  . Years of Education: N/A   Occupational History  . Not on file.   Social History Main Topics  . Smoking status: Never Smoker   . Smokeless tobacco: Never Used  . Alcohol Use: No  . Drug Use: No  . Sexual Activity: Not on file   Other Topics Concern  . Not on file   Social History Narrative   No current facility-administered medications on file prior to encounter.   Current Outpatient Prescriptions on  File Prior to Encounter  Medication Sig Dispense Refill  . dicyclomine (BENTYL) 20 MG tablet Take 1 tablet (20 mg total) by mouth 2 (two) times daily. 20 tablet 0  . promethazine (PHENERGAN) 25 MG tablet Take 1 tablet (25 mg total) by mouth every 6 (six) hours as needed for nausea or vomiting. 30 tablet 0   No Known Allergies  ROS:  Review of Systems  Constitutional: Negative for fever, chills and fatigue.  HENT: Negative for sinus pressure.   Eyes: Negative for photophobia.  Respiratory: Negative for shortness of breath.   Cardiovascular: Negative for chest pain.  Gastrointestinal: Positive for abdominal pain. Negative for nausea, vomiting, diarrhea and constipation.  Genitourinary: Negative for dysuria, frequency, flank pain, vaginal bleeding, vaginal discharge, difficulty urinating, vaginal pain and pelvic pain.  Musculoskeletal: Negative for neck pain.  Neurological: Negative for dizziness, weakness and headaches.  Psychiatric/Behavioral: Negative.     I have reviewed patient's Past Medical Hx, Surgical Hx, Family Hx, Social Hx, medications and allergies.   Physical Exam   Patient Vitals for the past 24 hrs:  BP Temp Temp src Pulse Resp Height Weight  10/12/15 1040 135/60 mmHg 98.6 F (37 C) Oral 91 18  (1.626 m) 221 lb 6.4 oz (100.426 kg)   Constitutional: Well-developed, well-nourished female in no acute distress.  Cardiovascular: normal rate Respiratory: normal effort GI: Abd soft, non-tender. Pos BS x 4 MS: Extremities nontender, no edema, normal ROM Neurologic: Alert and oriented  x 4.  GU: Neg CVAT.  PELVIC EXAM: Deferred per pt  LAB RESULTS Results for orders placed or performed during the hospital encounter of 10/12/15 (from the past 24 hour(s))  hCG, quantitative, pregnancy     Status: Abnormal   Collection Time: 10/12/15 12:25 PM  Result Value Ref Range   hCG, Beta Chain, Quant, S 2079 (H) <5 mIU/mL       IMAGING [place] Ob Comp Less 14  Wks  10/12/2015  CLINICAL DATA:  Pelvic pain and cramping for 2 weeks. Gestational age by LMP of 13 weeks 0 days. EXAM: OBSTETRIC <14 WK US AND TRANSVAGINAL OB US TECHNIQUE: Both transabdominal and transvaginal ultrasound examinations were performed for complete evaluation of the gestation as well as the maternal uterus, adnexal regions, and pelvic cul-de-sac. Transvaginal technique was performed to assess early pregnancy. COMPARISON:  None. FINDINGS: Intrauterine gestational sac: Visualized/normal in shape. Located in lower portion of the endometrial cavity. Yolk sac:  Visualized Embryo:  None visualized MSD: 6  mm   5 w   2  d Maternal uterus/adnexae: A 1 cm fibroid is seen in the left posterior uterine corpus. Both ovaries are normal in appearance. No adnexal mass or free fluid identified. IMPRESSION: Single 5 week intrauterine gestational sac seen in lower portion of endometrial cavity. Recommend followup of quantitative HCG levels, and consider followup ultrasound to assess viability in 10 days. 1 cm intramural fibroid.  No adnexal mass or free fluid identified. Electronically Signed   By: Myles RosenthalJohn  Stahl M.D.   On: 10/12/2015 13:05   [place] Ob Transvaginal  10/12/2015  CLINICAL DATA:  Pelvic pain and cramping for 2 weeks. Gestational age by LMP of 13 weeks 0 days. EXAM: OBSTETRIC <14 WK US AND TRANSVAGINAL OB US TECHNIQUE: Both transabdominal and transvaginal ultrasound examinations were performed for complete evaluation of the gestation as well as the maternal uterus, adnexal regions, and pelvic cul-de-sac. Transvaginal technique was performed to assess early pregnancy. COMPARISON:  None. FINDINGS: Intrauterine gestational sac: Visualized/normal in shape. Located in lower portion of the endometrial cavity. Yolk sac:  Visualized Embryo:  None visualized MSD: 6  mm   5 w   2  d Maternal uterus/adnexae: A 1 cm fibroid is seen in the left posterior uterine corpus. Both ovaries are normal in appearance. No adnexal  mass or free fluid identified. IMPRESSION: Single 5 week intrauterine gestational sac seen in lower portion of endometrial cavity. Recommend followup of quantitative HCG levels, and consider followup ultrasound to assess viability in 10 days. 1 cm intramural fibroid.  No adnexal mass or free fluid identified. Electronically Signed   By: Myles RosenthalJohn  Stahl M.D.   On: 10/12/2015 13:05    MAU Management/MDM: Ordered labs and imaging and reviewed results.  With visible yolk sac, IUP confirmed by today's ultrasound.  Pt to f/u with early prenatal care.  Pt stable at time of discharge.  ASSESSMENT 1. Normal IUP (intrauterine pregnancy) on prenatal ultrasound, first trimester   2. Pelvic pain affecting pregnancy in first trimester, antepartum     PLAN Discharge home with first trimester pregnancy precautions F/U with early prenatal care, pt given list of providers and pregnancy verification letter Return to MAU as needed for emergencies    Medication List    STOP taking these medications        norethindrone-ethinyl estradiol 1-20 MG-MCG tablet  Commonly known as:  JUNEL FE,GILDESS FE,LOESTRIN FE     sulfamethoxazole-trimethoprim 800-160 MG tablet  Commonly known as:  BACTRIM DS,SEPTRA  DS      TAKE these medications        dicyclomine 20 MG tablet  Commonly known as:  BENTYL  Take 1 tablet (20 mg total) by mouth 2 (two) times daily.     Prenatal Vitamins 28-0.8 MG Tabs  Take 1 tablet by mouth daily.     promethazine 25 MG tablet  Commonly known as:  PHENERGAN  Take 1 tablet (25 mg total) by mouth every 6 (six) hours as needed for nausea or vomiting.       Follow-up Information    Please follow up.   Why:  Start prenatal care as soon as possible, Return to MAU as needed for emergencies      Sharen Counter Certified Nurse-Midwife 10/12/2015  5:10 PM

## 2015-10-12 NOTE — MAU Note (Signed)
Pt stated she was in int she  Emergency [place] in [place] for cramping Told her hormone level was 661. Told to follow up when when she got back to town. Pt report she is not having andy pain or bleeding at this time.

## 2015-10-13 LAB — CULTURE, OB URINE

## 2015-10-19 ENCOUNTER — Inpatient Hospital Stay (HOSPITAL_COMMUNITY): Payer: Medicaid Other

## 2015-10-19 ENCOUNTER — Encounter (HOSPITAL_COMMUNITY): Payer: Self-pay | Admitting: *Deleted

## 2015-10-19 ENCOUNTER — Inpatient Hospital Stay (HOSPITAL_COMMUNITY)
Admission: AD | Admit: 2015-10-19 | Discharge: 2015-10-19 | Disposition: A | Discharge status: Home / Self Care | Payer: Medicaid Other | Source: Ambulatory Visit | Attending: Obstetrics & Gynecology | Admitting: Obstetrics & Gynecology

## 2015-10-19 DIAGNOSIS — O209 Hemorrhage in early pregnancy, unspecified: Secondary | ICD-10-CM | POA: Diagnosis not present

## 2015-10-19 DIAGNOSIS — Z8249 Family history of ischemic heart disease and other diseases of the circulatory system: Secondary | ICD-10-CM | POA: Insufficient documentation

## 2015-10-19 DIAGNOSIS — Z3A01 Less than 8 weeks gestation of pregnancy: Secondary | ICD-10-CM | POA: Insufficient documentation

## 2015-10-19 DIAGNOSIS — O4691 Antepartum hemorrhage, unspecified, first trimester: Secondary | ICD-10-CM

## 2015-10-19 HISTORY — DX: Other specified health status: Z78.9

## 2015-10-19 LAB — URINALYSIS, ROUTINE W REFLEX MICROSCOPIC
Bilirubin Urine: NEGATIVE
Glucose, UA: NEGATIVE mg/dL
Hgb urine dipstick: NEGATIVE
Ketones, ur: NEGATIVE mg/dL
Leukocytes, UA: NEGATIVE
Nitrite: NEGATIVE
Protein, ur: NEGATIVE mg/dL
Specific Gravity, Urine: 1.025 (ref 1.005–1.030)
pH: 6.5 (ref 5.0–8.0)

## 2015-10-19 LAB — CBC
HCT: 33.8 % — ABNORMAL LOW (ref 36.0–46.0)
Hemoglobin: 11.1 g/dL — ABNORMAL LOW (ref 12.0–15.0)
MCH: 29.2 pg (ref 26.0–34.0)
MCHC: 32.8 g/dL (ref 30.0–36.0)
MCV: 88.9 fL (ref 78.0–100.0)
Platelets: 223 10*3/uL (ref 150–400)
RBC: 3.8 MIL/uL — ABNORMAL LOW (ref 3.87–5.11)
RDW: 13.6 % (ref 11.5–15.5)
WBC: 6.2 10*3/uL (ref 4.0–10.5)

## 2015-10-19 LAB — ABO/RH: ABO/RH(D): O POS

## 2015-10-19 LAB — WET PREP, GENITAL
Sperm: NONE SEEN
Trich, Wet Prep: NONE SEEN
Yeast Wet Prep HPF POC: NONE SEEN

## 2015-10-19 NOTE — MAU Provider Note (Signed)
History     CSN: [id number]  Arrival date and time: 10/19/15 0865   First Provider Initiated Contact with Patient 10/19/15 [phone]      Chief Complaint  Patient presents with  . Vaginal Bleeding   HPI Paula [name] 24 y.o. [redacted]w[redacted]d   Comes to MAU today with bleeding noted when wiping for 2 days.  Had cramping last night but no cramping today.  States that the bleeding was pink at first and is getting darker red today.  Has not yet established with a doctor.  Was in MAU on 10-12-15.  Notes, labs, and ultrasound results reviewed.  Had only cramping last week and then the bleeding started 2 days ago.  Ultrasound saw a gestational sac and yolk sac last week.  Client has not decided whether to stay in [place] or move to [place] with the father of the baby.  Plans to go to [place] next week.  Discussed the importance of establishing prenatal care and keeping her appointment.  While BP is not considered elevated, it is somewhat high for this time in pregnancy and she has a strong family history of hypertension.  OB History    Gravida Para Term Preterm AB TAB SAB Ectopic Multiple Living   1               Past Medical History  Diagnosis Date  . Medical history non-contributory     Past Surgical History  Procedure Laterality Date  . Wisdom tooth extraction      History reviewed. No pertinent family history.  Social History  Substance Use Topics  . Smoking status: Never Smoker   . Smokeless tobacco: Never Used  . Alcohol Use: No    Allergies: No Known Allergies  Prescriptions prior to admission  Medication Sig Dispense Refill Last Dose  . OVER THE COUNTER MEDICATION Take 1 tablet by mouth daily. Patient takes over the counter Prenatal Vitamins   10/18/2015 at Unknown time  . dicyclomine (BENTYL) 20 MG tablet Take 1 tablet (20 mg total) by mouth 2 (two) times daily. (Patient not taking: Reported on 10/19/2015) 20 tablet 0 Not Taking at Unknown time  . Prenatal Vit-Fe  Fumarate-FA (PRENATAL VITAMINS) 28-0.8 MG TABS Take 1 tablet by mouth daily. (Patient not taking: Reported on 10/19/2015) 30 tablet 5 Not Taking at Unknown time  . promethazine (PHENERGAN) 25 MG tablet Take 1 tablet (25 mg total) by mouth every 6 (six) hours as needed for nausea or vomiting. (Patient not taking: Reported on 10/19/2015) 30 tablet 0 Not Taking at Unknown time    Review of Systems  Constitutional: Negative for fever.  Gastrointestinal: Positive for abdominal pain. Negative for nausea, vomiting, diarrhea and constipation.  Genitourinary:       No vaginal discharge. Vaginal bleeding. No dysuria.    Physical Exam   Blood pressure 135/75, pulse 91, temperature 99.1 F (37.3 C), temperature source Oral, resp. rate 18, last menstrual period 07/13/2015.  Physical Exam  Nursing note and vitals reviewed. Constitutional: She is oriented to person, place, and time. She appears well-developed and well-nourished.  HENT:  Head: Normocephalic.  Eyes: EOM are normal.  Neck: Neck supple.  Respiratory: Effort normal.  GI: Soft. There is no tenderness.  Genitourinary:  Speculum exam: Vulva - no blood seen Vagina - Small amount of clear yellow/pink discharge Cervix - No contact bleeding, no active bleeding seen Bimanual exam: Cervix closed and thick Uterus non tender, difficult to size due to habitus Adnexa non tender GC/Chlam,  wet prep done Chaperone present for exam.  Musculoskeletal: Normal range of motion.  Neurological: She is alert and oriented to person, place, and time.  Skin: Skin is warm and dry.  Psychiatric: She has a normal mood and affect.    MAU Course  Procedures  MDM CLINICAL DATA: Vaginal bleeding  EXAM: TRANSVAGINAL OB ULTRASOUND  TECHNIQUE: Transvaginal ultrasound was performed for complete evaluation of the gestation as well as the maternal uterus, adnexal regions, and pelvic cul-de-sac.  COMPARISON: 10/12/2015  FINDINGS: Intrauterine  gestational sac: Visualized/normal in shape.  Yolk sac: Present  Embryo: Present  Cardiac Activity: Present  Heart Rate: 112 bpm  CRL: 5 mm 6 w 1 d US EDC: 06/12/2016  Maternal uterus/adnexae: Within normal limits  IMPRESSION: Single live intrauterine gestation at 6 weeks 1 day. This shows adequate progression when compared with the recent examination.   Results for orders placed or performed during the hospital encounter of 10/19/15 (from the past 24 hour(s))  Urinalysis, Routine w reflex microscopic (not at [facility])     Status: None   Collection Time: 10/19/15  8:40 AM  Result Value Ref Range   Color, Urine YELLOW YELLOW   APPearance CLEAR CLEAR   Specific Gravity, Urine 1.025 1.005 - 1.030   pH 6.5 5.0 - 8.0   Glucose, UA NEGATIVE NEGATIVE mg/dL   Hgb urine dipstick NEGATIVE NEGATIVE   Bilirubin Urine NEGATIVE NEGATIVE   Ketones, ur NEGATIVE NEGATIVE mg/dL   Protein, ur NEGATIVE NEGATIVE mg/dL   Nitrite NEGATIVE NEGATIVE   Leukocytes, UA NEGATIVE NEGATIVE  Wet prep, genital     Status: Abnormal   Collection Time: 10/19/15  9:30 AM  Result Value Ref Range   Yeast Wet Prep HPF POC NONE SEEN NONE SEEN   Trich, Wet Prep NONE SEEN NONE SEEN   Clue Cells Wet Prep HPF POC PRESENT (A) NONE SEEN   WBC, Wet Prep HPF POC MANY (A) NONE SEEN   Sperm NONE SEEN   ABO/Rh     Status: None   Collection Time: 10/19/15  9:37 AM  Result Value Ref Range   ABO/RH(D) O POS   CBC     Status: Abnormal   Collection Time: 10/19/15  9:37 AM  Result Value Ref Range   WBC 6.2 4.0 - 10.5 K/uL   RBC 3.80 (L) 3.87 - 5.11 MIL/uL   Hemoglobin 11.1 (L) 12.0 - 15.0 g/dL   HCT 16.133.8 (L) 09.636.0 - 04.546.0 %   MCV 88.9 78.0 - 100.0 fL   MCH 29.2 26.0 - 34.0 pg   MCHC 32.8 30.0 - 36.0 g/dL   RDW 40.913.6 81.111.5 - 91.415.5 %   Platelets 223 150 - 400 K/uL     Assessment and Plan  Bleeding in early pregnancy, first trimester  Plan Begin prenatal care as soon as  possible Verification of pregnancy given. Drink at least 8 8-oz glasses of water every day. Take Tylenol 325 mg 2 tablets by mouth every 4 hours if needed for pain. Return with any worsening symptoms. GC/Chlam pending.  Euline Kimbler 10/19/2015, 9:30 AM

## 2015-10-19 NOTE — Discharge Instructions (Signed)
Drink at least 8 8-oz glasses of water every day. Take Tylenol 325 mg 2 tablets by mouth every 4 hours if needed for pain. Return with any worsening symptoms.

## 2015-10-19 NOTE — MAU Note (Addendum)
Pt C/O pink discharge with wiping that started last night, she has noticed red dots in the discharge this a.m.  Denies pain.  Pt was having LLQ pain yesterday, none today.

## 2015-10-20 LAB — HIV ANTIBODY (ROUTINE TESTING W REFLEX): HIV Screen 4th Generation wRfx: NONREACTIVE

## 2015-10-20 LAB — GC/CHLAMYDIA PROBE AMP (~~LOC~~) NOT AT ARMC
Chlamydia: NEGATIVE
Neisseria Gonorrhea: NEGATIVE

## 2015-10-21 LAB — RPR: RPR Ser Ql: NONREACTIVE

## 2015-10-31 LAB — OB RESULTS CONSOLE GC/CHLAMYDIA
Chlamydia: NEGATIVE
Gonorrhea: NEGATIVE

## 2015-10-31 LAB — OB RESULTS CONSOLE RUBELLA ANTIBODY, IGM: Rubella: IMMUNE

## 2015-10-31 LAB — OB RESULTS CONSOLE RPR: RPR: NONREACTIVE

## 2015-10-31 LAB — OB RESULTS CONSOLE HEPATITIS B SURFACE ANTIGEN: Hepatitis B Surface Ag: NEGATIVE

## 2015-10-31 LAB — OB RESULTS CONSOLE HIV ANTIBODY (ROUTINE TESTING): HIV: NONREACTIVE

## 2016-01-18 ENCOUNTER — Inpatient Hospital Stay (HOSPITAL_COMMUNITY)
Admission: AD | Admit: 2016-01-18 | Discharge: 2016-01-18 | Disposition: A | Discharge status: Home / Self Care | Payer: Medicaid Other | Source: Ambulatory Visit | Attending: Obstetrics & Gynecology | Admitting: Obstetrics & Gynecology

## 2016-01-18 ENCOUNTER — Encounter (HOSPITAL_COMMUNITY): Payer: Self-pay | Admitting: *Deleted

## 2016-01-18 DIAGNOSIS — Z3A19 19 weeks gestation of pregnancy: Secondary | ICD-10-CM | POA: Diagnosis not present

## 2016-01-18 DIAGNOSIS — R51 Headache: Secondary | ICD-10-CM | POA: Diagnosis present

## 2016-01-18 DIAGNOSIS — O1202 Gestational edema, second trimester: Secondary | ICD-10-CM | POA: Diagnosis not present

## 2016-01-18 LAB — URINALYSIS, ROUTINE W REFLEX MICROSCOPIC
Bilirubin Urine: NEGATIVE
Glucose, UA: NEGATIVE mg/dL
Hgb urine dipstick: NEGATIVE
Ketones, ur: NEGATIVE mg/dL
Leukocytes, UA: NEGATIVE
Nitrite: NEGATIVE
Protein, ur: NEGATIVE mg/dL
Specific Gravity, Urine: 1.025 (ref 1.005–1.030)
pH: 6 (ref 5.0–8.0)

## 2016-01-18 NOTE — MAU Provider Note (Signed)
History    Paula [name] is a 25y.o. G1P0 at 19.2wks who presents, after phone call, for swelling and HA.  Patient reports swelling started about 2-3 weeks ago and has been ongoing.  Patient states swelling not always relieved with elevation.  Patient denies pain, visual disturbances, SOB, and issues with bp.  Patient does report being told she had "borderline bp back in November" and review of the chart shows bp 135/60 in MAU.  Patient reports current headache of 3/10.  Patient states headaches usually relieved with napping and no OTC medications taken.   There are no active problems to display for this patient.   Chief Complaint  Patient presents with  . Headache   HPI  OB History    Gravida Para Term Preterm AB TAB SAB Ectopic Multiple Living   1               Past Medical History  Diagnosis Date  . Medical history non-contributory     Past Surgical History  Procedure Laterality Date  . Wisdom tooth extraction      History reviewed. No pertinent family history.  Social History  Substance Use Topics  . Smoking status: Never Smoker   . Smokeless tobacco: Never Used  . Alcohol Use: No    Allergies: No Known Allergies  Prescriptions prior to admission  Medication Sig Dispense Refill Last Dose  . dicyclomine (BENTYL) 20 MG tablet Take 1 tablet (20 mg total) by mouth 2 (two) times daily. (Patient not taking: Reported on 10/19/2015) 20 tablet 0 Not Taking at Unknown time  . OVER THE COUNTER MEDICATION Take 1 tablet by mouth daily. Patient takes over the counter Prenatal Vitamins   10/18/2015 at Unknown time  . Prenatal Vit-Fe Fumarate-FA (PRENATAL VITAMINS) 28-0.8 MG TABS Take 1 tablet by mouth daily. (Patient not taking: Reported on 10/19/2015) 30 tablet 5 Not Taking at Unknown time  . promethazine (PHENERGAN) 25 MG tablet Take 1 tablet (25 mg total) by mouth every 6 (six) hours as needed for nausea or vomiting. (Patient not taking: Reported on 10/19/2015) 30 tablet  0 Not Taking at Unknown time    ROS  See HPI Above Physical Exam   Blood pressure 121/64, pulse 85, temperature 98.1 F (36.7 C), temperature source Oral, resp. rate 20, height 5' 3.5" (1.613 m), weight 105.802 kg (233 lb 4 oz), last menstrual period 07/13/2015.  Results for orders placed or performed during the hospital encounter of 01/18/16 (from the past 24 hour(s))  Urinalysis, Routine w reflex microscopic (not at [facility])     Status: None   Collection Time: 01/18/16  8:00 PM  Result Value Ref Range   Color, Urine YELLOW YELLOW   APPearance CLEAR CLEAR   Specific Gravity, Urine 1.025 1.005 - 1.030   pH 6.0 5.0 - 8.0   Glucose, UA NEGATIVE NEGATIVE mg/dL   Hgb urine dipstick NEGATIVE NEGATIVE   Bilirubin Urine NEGATIVE NEGATIVE   Ketones, ur NEGATIVE NEGATIVE mg/dL   Protein, ur NEGATIVE NEGATIVE mg/dL   Nitrite NEGATIVE NEGATIVE   Leukocytes, UA NEGATIVE NEGATIVE    Physical Exam  Constitutional: She is oriented to person, place, and time. She appears well-developed and well-nourished.  HENT:  Head: Normocephalic and atraumatic.  Eyes: Conjunctivae are normal.  Neck: Normal range of motion.  Cardiovascular: Normal rate.   Respiratory: Effort normal.  GI: Soft.  Musculoskeletal: She exhibits edema (.+1 Pitting).       Right ankle: She exhibits swelling. She exhibits normal  range of motion. No tenderness.       Left ankle: She exhibits swelling. She exhibits normal range of motion. No tenderness.  Neurological: She is alert and oriented to person, place, and time.  Skin: Skin is warm and dry.     FHR: 145 by doppler  ED Course  Assessment: IUP at 19.2wks Edema Headache  Plan: -Educated on edema in pregnancy and comfort measures *Increased hydration *Decreased sodium intake *Increased elevation and ambulation *Compression stockings -Educated on headaches in pregnancy *Always attempt non-pharmacologic methods for relief *Tylenol as appropriate -Encouraged to  call if any questions or concerns arise prior to next scheduled office visit.  -Keep appt as scheduled: March 1 -Discharged to home in stable condition  Cherre Robins CNM, MSN 01/18/2016 8:34 PM

## 2016-01-18 NOTE — MAU Note (Signed)
PT SAYS  SHE  HAS SWOLLEN  ANKLES  X2-3 WEEKS  -  AFTER    ELEVATING   THEY ARE STILL SWOLLEN .  CALLED OFFICE  TONIGHT - SAID WOULD  CALL  HER  TOMORROW-   BUT  SHE  CAME   WANTING  TO BE  SAFE.   SAYS ALSO HAS H/A-  STARTED  2-3  WEEKS AGO.    NOW IN TRIAGE - HAS  SLIGHT  H/A- NO MEDS  FOR H/A.     NEXT APPOINTMENT  AT [facility]  IS 3-1.

## 2016-01-18 NOTE — Discharge Instructions (Signed)
Second Trimester of Pregnancy The second trimester is from week 13 through week 28, months 4 through 6. The second trimester is often a time when you feel your best. Your body has also adjusted to being pregnant, and you begin to feel better physically. Usually, morning sickness has lessened or quit completely, you may have more energy, and you may have an increase in appetite. The second trimester is also a time when the fetus is growing rapidly. At the end of the sixth month, the fetus is about 9 inches long and weighs about 1 pounds. You will likely begin to feel the baby move (quickening) between 18 and 20 weeks of the pregnancy. BODY CHANGES Your body goes through many changes during pregnancy. The changes vary from woman to woman.   Your weight will continue to increase. You will notice your lower abdomen bulging out.  You may begin to get stretch marks on your hips, abdomen, and breasts.  You may develop headaches that can be relieved by medicines approved by your health care provider.  You may urinate more often because the fetus is pressing on your bladder.  You may develop or continue to have heartburn as a result of your pregnancy.  You may develop constipation because certain hormones are causing the muscles that push waste through your intestines to slow down.  You may develop hemorrhoids or swollen, bulging veins (varicose veins).  You may have back pain because of the weight gain and pregnancy hormones relaxing your joints between the bones in your pelvis and as a result of a shift in weight and the muscles that support your balance.  Your breasts will continue to grow and be tender.  Your gums may bleed and may be sensitive to brushing and flossing.  Dark spots or blotches (chloasma, mask of pregnancy) may develop on your face. This will likely fade after the baby is born.  A dark line from your belly button to the pubic area (linea nigra) may appear. This will likely fade  after the baby is born.  You may have changes in your hair. These can include thickening of your hair, rapid growth, and changes in texture. Some women also have hair loss during or after pregnancy, or hair that feels dry or thin. Your hair will most likely return to normal after your baby is born. WHAT TO EXPECT AT YOUR PRENATAL VISITS During a routine prenatal visit:  You will be weighed to make sure you and the fetus are growing normally.  Your blood pressure will be taken.  Your abdomen will be measured to track your baby's growth.  The fetal heartbeat will be listened to.  Any test results from the previous visit will be discussed. Your health care provider may ask you:  How you are feeling.  If you are feeling the baby move.  If you have had any abnormal symptoms, such as leaking fluid, bleeding, severe headaches, or abdominal cramping.  If you are using any tobacco products, including cigarettes, chewing tobacco, and electronic cigarettes.  If you have any questions. Other tests that may be performed during your second trimester include:  Blood tests that check for:  Low iron levels (anemia).  Gestational diabetes (between 24 and 28 weeks).  Rh antibodies.  Urine tests to check for infections, diabetes, or protein in the urine.  An ultrasound to confirm the proper growth and development of the baby.  An amniocentesis to check for possible genetic problems.  Fetal screens for spina bifida  and Down syndrome.  HIV (human immunodeficiency virus) testing. Routine prenatal testing includes screening for HIV, unless you choose not to have this test. HOME CARE INSTRUCTIONS   Avoid all smoking, herbs, alcohol, and unprescribed drugs. These chemicals affect the formation and growth of the baby.  Do not use any tobacco products, including cigarettes, chewing tobacco, and electronic cigarettes. If you need help quitting, ask your health care provider. You may receive  counseling support and other resources to help you quit.  Follow your health care provider's instructions regarding medicine use. There are medicines that are either safe or unsafe to take during pregnancy.  Exercise only as directed by your health care provider. Experiencing uterine cramps is a good sign to stop exercising.  Continue to eat regular, healthy meals.  Wear a good support bra for breast tenderness.  Do not use hot tubs, steam rooms, or saunas.  Wear your seat belt at all times when driving.  Avoid raw meat, uncooked cheese, cat litter boxes, and soil used by cats. These carry germs that can cause birth defects in the baby.  Take your prenatal vitamins.  Take 1500-2000 mg of calcium daily starting at the 20th week of pregnancy until you deliver your baby.  Try taking a stool softener (if your health care provider approves) if you develop constipation. Eat more high-fiber foods, such as fresh vegetables or fruit and whole grains. Drink plenty of fluids to keep your urine clear or pale yellow.  Take warm sitz baths to soothe any pain or discomfort caused by hemorrhoids. Use hemorrhoid cream if your health care provider approves.  If you develop varicose veins, wear support hose. Elevate your feet for 15 minutes, 3-4 times a day. Limit salt in your diet.  Avoid heavy lifting, wear low heel shoes, and practice good posture.  Rest with your legs elevated if you have leg cramps or low back pain.  Visit your dentist if you have not gone yet during your pregnancy. Use a soft toothbrush to brush your teeth and be gentle when you floss.  A sexual relationship may be continued unless your health care provider directs you otherwise.  Continue to go to all your prenatal visits as directed by your health care provider. SEEK MEDICAL CARE IF:   You have dizziness.  You have mild pelvic cramps, pelvic pressure, or nagging pain in the abdominal area.  You have persistent nausea,  vomiting, or diarrhea.  You have a bad smelling vaginal discharge.  You have pain with urination. SEEK IMMEDIATE MEDICAL CARE IF:   You have a fever.  You are leaking fluid from your vagina.  You have spotting or bleeding from your vagina.  You have severe abdominal cramping or pain.  You have rapid weight gain or loss.  You have shortness of breath with chest pain.  You notice sudden or extreme swelling of your face, hands, ankles, feet, or legs.  You have not felt your baby move in over an hour.  You have severe headaches that do not go away with medicine.  You have vision changes.   This information is not intended to replace advice given to you by your health care provider. Make sure you discuss any questions you have with your health care provider.   Document Released: 11/05/2001 Document Revised: 12/02/2014 Document Reviewed: 01/12/2013 Elsevier Interactive Patient Education 2016 [employer] Edema Edema is an abnormal buildup of fluids. It is more common in your legs and thighs. Painless swelling of the feet  and ankles is more likely as a person ages. It also is common in looser skin, like around your eyes. HOME CARE   Keep the affected body part above the level of the heart while lying down.  Do not sit still or stand for a long time.  Do not put anything right under your knees when you lie down.  Do not wear tight clothes on your upper legs.  Exercise your legs to help the puffiness (swelling) go down.  Wear elastic bandages or support stockings as told by your doctor.  A low-salt diet may help lessen the puffiness.  Only take medicine as told by your doctor. GET HELP IF:  Treatment is not working.  You have heart, liver, or kidney disease and notice that your skin looks puffy or shiny.  You have puffiness in your legs that does not get better when you raise your legs.  You have sudden weight gain for no reason. GET HELP RIGHT AWAY IF:   You  have shortness of breath or chest pain.  You cannot breathe when you lie down.  You have pain, redness, or warmth in the areas that are puffy.  You have heart, liver, or kidney disease and get edema all of a sudden.  You have a fever and your symptoms get worse all of a sudden. MAKE SURE YOU:   Understand these instructions.  Will watch your condition.  Will get help right away if you are not doing well or get worse.   This information is not intended to replace advice given to you by your health care provider. Make sure you discuss any questions you have with your health care provider.   Document Released: 04/29/2008 Document Revised: 11/16/2013 Document Reviewed: 09/03/2013 Elsevier Interactive Patient Education [employer]

## 2016-02-07 ENCOUNTER — Inpatient Hospital Stay (HOSPITAL_COMMUNITY)
Admission: AD | Admit: 2016-02-07 | Discharge: 2016-02-07 | Disposition: A | Discharge status: Home / Self Care | Payer: Medicaid Other | Source: Ambulatory Visit | Attending: Obstetrics and Gynecology | Admitting: Obstetrics and Gynecology

## 2016-02-07 DIAGNOSIS — O368120 Decreased fetal movements, second trimester, not applicable or unspecified: Secondary | ICD-10-CM | POA: Insufficient documentation

## 2016-02-07 DIAGNOSIS — O368190 Decreased fetal movements, unspecified trimester, not applicable or unspecified: Secondary | ICD-10-CM

## 2016-02-07 DIAGNOSIS — Z3A22 22 weeks gestation of pregnancy: Secondary | ICD-10-CM | POA: Insufficient documentation

## 2016-02-07 NOTE — MAU Note (Signed)
Decreased fetal movement the past 2 days.   Past wk had been feeling every couple hours, so she started getting worried when she hadn't felt it.  Since arriving, she has felt movement twice

## 2016-02-07 NOTE — MAU Provider Note (Signed)
Paula [name] is a 25 y.o. G1P0 at 22.1 weeks unsure of what movements she should feel at this state of pregnancy.  Denies vb or lof w/no ctx   History     There are no active problems to display for this patient.   Chief Complaint  Patient presents with  . Decreased Fetal Movement   HPI  OB History    Gravida Para Term Preterm AB TAB SAB Ectopic Multiple Living   1               Past Medical History  Diagnosis Date  . Medical history non-contributory     Past Surgical History  Procedure Laterality Date  . Wisdom tooth extraction      No family history on file.  Social History  Substance Use Topics  . Smoking status: Never Smoker   . Smokeless tobacco: Never Used  . Alcohol Use: No    Allergies: No Known Allergies  Prescriptions prior to admission  Medication Sig Dispense Refill Last Dose  . Prenatal Vit-Fe Fumarate-FA (PRENATAL MULTIVITAMIN) TABS tablet Take 1 tablet by mouth daily at 12 noon.   01/17/2016 at Unknown time    ROS See HPI above, all other systems are negative  Physical Exam   Blood pressure 131/60, pulse 86, temperature 98.3 F (36.8 C), temperature source Oral, resp. rate 18, weight 236 lb 3.2 oz (107.14 kg), last menstrual period 07/13/2015.  Physical Exam Ext:  WNL ABD: Soft, non tender to palpation, no rebound or guarding WNU:[id number]:deferred   ED Course  Assessment: IUP at  22.1weeks Membranes: intact FHR:154 CTX: none  Plan: -Discussed need to follow up in office in 2 weeks -Bleeding and PTL Precautions -Encouraged to call if any questions or concerns arise prior to next scheduled office visit.  -Discharged to home in stable condition    Brantleigh Mifflin, CNM, MSN 02/07/2016. 1:35 PM

## 2016-02-07 NOTE — MAU Note (Signed)
Urine sent to lab 

## 2016-02-07 NOTE — MAU Note (Signed)
Has felt more movement, pt reassured

## 2016-04-04 ENCOUNTER — Other Ambulatory Visit (HOSPITAL_COMMUNITY): Payer: Self-pay | Admitting: Advanced Practice Midwife

## 2016-04-04 DIAGNOSIS — O283 Abnormal ultrasonic finding on antenatal screening of mother: Secondary | ICD-10-CM

## 2016-04-04 DIAGNOSIS — Z3689 Encounter for other specified antenatal screening: Secondary | ICD-10-CM

## 2016-04-04 DIAGNOSIS — Z3A32 32 weeks gestation of pregnancy: Secondary | ICD-10-CM

## 2016-04-11 ENCOUNTER — Encounter (HOSPITAL_COMMUNITY): Payer: Self-pay | Admitting: Advanced Practice Midwife

## 2016-04-16 ENCOUNTER — Encounter (HOSPITAL_COMMUNITY): Payer: Self-pay

## 2016-04-17 ENCOUNTER — Ambulatory Visit (HOSPITAL_COMMUNITY)
Admission: RE | Admit: 2016-04-17 | Discharge: 2016-04-17 | Disposition: A | Discharge status: Home / Self Care | Payer: Medicaid Other | Source: Ambulatory Visit | Attending: Advanced Practice Midwife | Admitting: Advanced Practice Midwife

## 2016-04-17 ENCOUNTER — Encounter (HOSPITAL_COMMUNITY): Payer: Self-pay

## 2016-04-17 ENCOUNTER — Other Ambulatory Visit (HOSPITAL_COMMUNITY): Payer: Self-pay | Admitting: Advanced Practice Midwife

## 2016-04-17 DIAGNOSIS — O289 Unspecified abnormal findings on antenatal screening of mother: Secondary | ICD-10-CM | POA: Diagnosis not present

## 2016-04-17 DIAGNOSIS — Z3689 Encounter for other specified antenatal screening: Secondary | ICD-10-CM

## 2016-04-17 DIAGNOSIS — O283 Abnormal ultrasonic finding on antenatal screening of mother: Secondary | ICD-10-CM

## 2016-04-17 DIAGNOSIS — Z3A32 32 weeks gestation of pregnancy: Secondary | ICD-10-CM

## 2016-04-17 DIAGNOSIS — O99213 Obesity complicating pregnancy, third trimester: Secondary | ICD-10-CM | POA: Diagnosis not present

## 2016-04-17 DIAGNOSIS — Z36 Encounter for antenatal screening of mother: Secondary | ICD-10-CM | POA: Diagnosis not present

## 2016-04-17 NOTE — Progress Notes (Signed)
Genetic Counseling  Visit Summary Note  Appointment Date: 04/17/2016 Referred By: Mattie Marlinevane-Johnson,Stephanie  Date of Birth: 02/20/1991  Pregnancy history: G1P0 Estimated Date of Delivery: 06/12/16 Estimated Gestational Age: 4643w0d  [name] was seen for genetic counseling because of abnormal ultrasound findings. She was accompanied to the visit by her mother.   In summary:  Reviewed ultrasound findings and the association with fetal aneuploidy  Patient previously declined screening   Offered additional screening  NIPS - declined, but may consider later  Follow up ultrasound - scheduled  Reviewed normal screening for hemoglobinopathies  Reviewed family history concerns - none reported  [name] [facility] for ultrasound and consultation.  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  The ultrasound report will be sent under separate cover.  We discussed that the second trimester genetic sonogram is targeted at identifying features associated with aneuploidy.  It has evolved as a screening tool used to provide an individualized risk assessment for Down syndrome and other trisomies.  The ability of sonography to aid in the detection of aneuploidies relies on identification of both major structural anomalies and "soft markers."  The patient was counseled that the latter term refers to findings that are often normal variants and do not cause any significant medical problems.  Nonetheless, these markers have a known association with aneuploidy.    We discussed that SUA occurs in approximately 1 in every 200 pregnancies and is defined as the presence of one umbilical artery and one umbilical vein, instead of the typical three vessel cord containing two arteries and one vein.  We discussed that the finding of an isolated SUA (no other markers or anomalies visualized) is not thought to increase the risk for  fetal aneuploidy. However, the literature is conflicting regarding an association between this finding and congenital heart defects.  In addition, we discussed that SUA is known to be associated with an increased risk for fetal growth restriction.   We discussed that the fetal head size measures approximately 2 standard deviations below the mean. Microcephaly (small head) describes the head circumference measuring smaller than expected for age (less than the 5%tile). Prenatally, this describes the finding of the fetal head circumference measuring greater than 3 standard deviations below the expected measurement for the gestational age ([place]). Some sources further classify a measurement of 2-3 standard deviations (SD) below the mean GA as mild microcephaly. We discussed the correlation of head size to brain size. Often microcephaly may be seen secondary to a small brain, her baby's brain appears structurally normal. When small fetal head size is identified, long-term prognosis depends on both the severity of the microcephaly and the underlying cause.  [name] was counseled that isolated, mild microcephaly is most commonly a familial trait, meaning that one of the parents also has a small head.  However, we discussed the possibility of a underlying condition being responsible for the ultrasound findings.     Microcephaly can be seen as an isolated finding or more commonly may be associated with additional anomalies. Isolated forms of microcephaly have been observed to occur in families in both autosomal recessive and autosomal dominant patterns of inheritance. Microcephaly may occur secondary to differences in brain development (brain malformations or disruptions) or may be one feature in an underlying chromosome, genetic, or sporadic syndrome. Microcephaly may occur secondary to maternal exposures including intrauterine infections, alcohol, radiation, drugs, or hypoxia. Multifactorial causes (a combination of  environmental and genetic factors) may also be responsible for microcephaly in some cases. However, sometimes the underlying cause cannot be identified. All of these possibilities were discussed with the patient.   We reviewed chromosomes and the common features and variable prognosis of Down syndrome.  We also reviewed other aneuploidies including trisomies 13 and 74; however, we discussed that these conditions are less likely given that the remainder of the fetal anatomy was within normal limits by ultrasound.  We reviewed available screening and diagnostic options including noninvasive prenatal screening (NIPS)/cell free DNA (cfDNA) screening, and follow up ultrasound.  She was counseled regarding the benefits and limitations of each option. [name], declined NIPS for now, but she may consider it at a later.  She has had a normal fetal echocardiogram and will return for follow up ultrasound in 3-4 weeks.    [name] was provided with written information regarding sickle cell anemia (SCA) including the carrier frequency and incidence in the African-American population, the availability of carrier screening and prenatal diagnosis if indicated.  In addition, we discussed that hemoglobinopathies are routinely screened for as part of the [place] newborn screening panel.  She was counseled that hemoglobin electrophoresis was previously performed and reported to be normal.   Both family histories were reviewed and found to be noncontributory for birth defects, mental retardation, and known genetic conditions. Without further information regarding the provided family history, an accurate genetic risk cannot be calculated. Further genetic counseling is warranted if more information is obtained.  [name] denied exposure to environmental toxins or chemical agents. She denied the use of alcohol, tobacco or street drugs. She denied significant viral illnesses during the course of her pregnancy. Her medical and  surgical histories were noncontributory.   I counseled [name] regarding the above risks and available options.  The approximate face-to-face time with the genetic counselor was 40 minutes.  Mady Gemma, MS,  Certified Genetic Counselor

## 2016-04-18 ENCOUNTER — Other Ambulatory Visit (HOSPITAL_COMMUNITY): Payer: Self-pay

## 2016-05-02 ENCOUNTER — Other Ambulatory Visit (HOSPITAL_COMMUNITY): Payer: Self-pay | Admitting: Advanced Practice Midwife

## 2016-05-02 DIAGNOSIS — O368130 Decreased fetal movements, third trimester, not applicable or unspecified: Secondary | ICD-10-CM

## 2016-05-08 ENCOUNTER — Other Ambulatory Visit (HOSPITAL_COMMUNITY): Payer: Self-pay | Admitting: Advanced Practice Midwife

## 2016-05-08 ENCOUNTER — Ambulatory Visit (HOSPITAL_COMMUNITY)
Admission: RE | Admit: 2016-05-08 | Discharge: 2016-05-08 | Disposition: A | Discharge status: Home / Self Care | Payer: Medicaid Other | Source: Ambulatory Visit | Attending: Obstetrics and Gynecology | Admitting: Obstetrics and Gynecology

## 2016-05-08 ENCOUNTER — Encounter (HOSPITAL_COMMUNITY): Payer: Self-pay

## 2016-05-08 DIAGNOSIS — O99213 Obesity complicating pregnancy, third trimester: Secondary | ICD-10-CM | POA: Diagnosis present

## 2016-05-08 DIAGNOSIS — Z3A35 35 weeks gestation of pregnancy: Secondary | ICD-10-CM | POA: Insufficient documentation

## 2016-05-08 DIAGNOSIS — O368130 Decreased fetal movements, third trimester, not applicable or unspecified: Secondary | ICD-10-CM

## 2016-05-09 ENCOUNTER — Encounter (HOSPITAL_COMMUNITY): Payer: Self-pay

## 2016-05-09 ENCOUNTER — Other Ambulatory Visit (HOSPITAL_COMMUNITY): Payer: Self-pay

## 2016-05-20 ENCOUNTER — Inpatient Hospital Stay (HOSPITAL_COMMUNITY): Payer: Medicaid Other

## 2016-05-20 ENCOUNTER — Inpatient Hospital Stay (HOSPITAL_COMMUNITY)
Admission: AD | Admit: 2016-05-20 | Discharge: 2016-05-20 | Disposition: A | Discharge status: Home / Self Care | Payer: Medicaid Other | Source: Ambulatory Visit | Attending: Obstetrics and Gynecology | Admitting: Obstetrics and Gynecology

## 2016-05-20 ENCOUNTER — Encounter (HOSPITAL_COMMUNITY): Payer: Self-pay | Admitting: *Deleted

## 2016-05-20 DIAGNOSIS — O4703 False labor before 37 completed weeks of gestation, third trimester: Secondary | ICD-10-CM | POA: Diagnosis not present

## 2016-05-20 DIAGNOSIS — O133 Gestational [pregnancy-induced] hypertension without significant proteinuria, third trimester: Secondary | ICD-10-CM | POA: Diagnosis not present

## 2016-05-20 DIAGNOSIS — Z3A36 36 weeks gestation of pregnancy: Secondary | ICD-10-CM | POA: Diagnosis not present

## 2016-05-20 DIAGNOSIS — R109 Unspecified abdominal pain: Secondary | ICD-10-CM | POA: Diagnosis present

## 2016-05-20 DIAGNOSIS — IMO0002 Reserved for concepts with insufficient information to code with codable children: Secondary | ICD-10-CM

## 2016-05-20 DIAGNOSIS — O479 False labor, unspecified: Secondary | ICD-10-CM

## 2016-05-20 DIAGNOSIS — M7989 Other specified soft tissue disorders: Secondary | ICD-10-CM | POA: Diagnosis present

## 2016-05-20 LAB — CBC
HCT: 29.7 % — ABNORMAL LOW (ref 36.0–46.0)
Hemoglobin: 9.9 g/dL — ABNORMAL LOW (ref 12.0–15.0)
MCH: 30.3 pg (ref 26.0–34.0)
MCHC: 33.3 g/dL (ref 30.0–36.0)
MCV: 90.8 fL (ref 78.0–100.0)
Platelets: 213 10*3/uL (ref 150–400)
RBC: 3.27 MIL/uL — ABNORMAL LOW (ref 3.87–5.11)
RDW: 14.4 % (ref 11.5–15.5)
WBC: 7.8 10*3/uL (ref 4.0–10.5)

## 2016-05-20 LAB — LACTATE DEHYDROGENASE: LDH: 119 U/L (ref 98–192)

## 2016-05-20 LAB — PROTEIN / CREATININE RATIO, URINE
Creatinine, Urine: 176 mg/dL
Protein Creatinine Ratio: 0.26 mg/mg{Cre} — ABNORMAL HIGH (ref 0.00–0.15)
Total Protein, Urine: 45 mg/dL

## 2016-05-20 LAB — URINALYSIS, ROUTINE W REFLEX MICROSCOPIC
Bilirubin Urine: NEGATIVE
Glucose, UA: NEGATIVE mg/dL
Hgb urine dipstick: NEGATIVE
Ketones, ur: NEGATIVE mg/dL
Leukocytes, UA: NEGATIVE
Nitrite: NEGATIVE
Protein, ur: NEGATIVE mg/dL
Specific Gravity, Urine: >1.03 — ABNORMAL HIGH (ref 1.005–1.030)
pH: 6 (ref 5.0–8.0)

## 2016-05-20 LAB — COMPREHENSIVE METABOLIC PANEL
ALT: 17 U/L (ref 14–54)
AST: 18 U/L (ref 15–41)
Albumin: 2.8 g/dL — ABNORMAL LOW (ref 3.5–5.0)
Alkaline Phosphatase: 115 U/L (ref 38–126)
Anion gap: 5 (ref 5–15)
BUN: 9 mg/dL (ref 6–20)
CO2: 27 mmol/L (ref 22–32)
Calcium: 9.2 mg/dL (ref 8.9–10.3)
Chloride: 106 mmol/L (ref 101–111)
Creatinine, Ser: 0.75 mg/dL (ref 0.44–1.00)
GFR calc Af Amer: >60 mL/min (ref >60)
GFR calc non Af Amer: >60 mL/min (ref >60)
Glucose, Bld: 75 mg/dL (ref 65–99)
Potassium: 4.1 mmol/L (ref 3.5–5.1)
Sodium: 138 mmol/L (ref 135–145)
Total Bilirubin: 0.5 mg/dL (ref 0.3–1.2)
Total Protein: 6.8 g/dL (ref 6.5–8.1)

## 2016-05-20 LAB — URIC ACID: Uric Acid, Serum: 6 mg/dL (ref 2.3–6.6)

## 2016-05-20 MED ORDER — ACETAMINOPHEN 325 MG PO TABS
650.0000 mg | ORAL_TABLET | Freq: Once | ORAL | Status: AC
  Administered 2016-05-20: 650 mg via ORAL
  Filled 2016-05-20: qty 2

## 2016-05-20 NOTE — MAU Note (Signed)
Pt C/O edema in feet & ankles for the past week, HA started today, lower back pain, pelvic pressure - since Saturday.  Denies bleeding or LOF.

## 2016-05-20 NOTE — Discharge Instructions (Signed)
Braxton Hicks Contractions °Contractions of the uterus can occur throughout pregnancy. Contractions are not always a sign that you are in labor.  °WHAT ARE BRAXTON HICKS CONTRACTIONS?  °Contractions that occur before labor are called Braxton Hicks contractions, or false labor. Toward the end of pregnancy (32-34 weeks), these contractions can develop more often and may become more forceful. This is not true labor because these contractions do not result in opening (dilatation) and thinning of the cervix. They are sometimes difficult to tell apart from true labor because these contractions can be forceful and people have different pain tolerances. You should not feel embarrassed if you go to the hospital with false labor. Sometimes, the only way to tell if you are in true labor is for your health care provider to look for changes in the cervix. °If there are no prenatal problems or other health problems associated with the pregnancy, it is completely safe to be sent home with false labor and await the onset of true labor. °HOW CAN YOU TELL THE DIFFERENCE BETWEEN TRUE AND FALSE LABOR? °False Labor °· The contractions of false labor are usually shorter and not as hard as those of true labor.   °· The contractions are usually irregular.   °· The contractions are often felt in the front of the lower abdomen and in the groin.   °· The contractions may go away when you walk around or change positions while lying down.   °· The contractions get weaker and are shorter lasting as time goes on.   °· The contractions do not usually become progressively stronger, regular, and closer together as with true labor.   °True Labor °1. Contractions in true labor last 30-70 seconds, become very regular, usually become more intense, and increase in frequency.   °2. The contractions do not go away with walking.   °3. The discomfort is usually felt in the top of the uterus and spreads to the lower abdomen and low back.   °4. True labor can  be determined by your health care provider with an exam. This will show that the cervix is dilating and getting thinner.   °WHAT TO REMEMBER °· Keep up with your usual exercises and follow other instructions given by your health care provider.   °· Take medicines as directed by your health care provider.   °· Keep your regular prenatal appointments.   °· Eat and drink lightly if you think you are going into labor.   °· If Braxton Hicks contractions are making you uncomfortable:   °· Change your position from lying down or resting to walking, or from walking to resting.   °· Sit and rest in a tub of warm water.   °· Drink 2-3 glasses of water. Dehydration may cause these contractions.   °· Do slow and deep breathing several times an hour.   °WHEN SHOULD I SEEK IMMEDIATE MEDICAL CARE? °Seek immediate medical care if: °· Your contractions become stronger, more regular, and closer together.   °· You have fluid leaking or gushing from your vagina.   °· You have a fever.   °· You pass blood-tinged mucus.   °· You have vaginal bleeding.   °· You have continuous abdominal pain.   °· You have low back pain that you never had before.   °· You feel your baby's head pushing down and causing pelvic pressure.   °· Your baby is not moving as much as it used to.   °  °This information is not intended to replace advice given to you by your health care provider. Make sure you discuss any questions you have with your health care   provider. °  °Document Released: 11/11/2005 Document Revised: 11/16/2013 Document Reviewed: 08/23/2013 °Elsevier Interactive Patient Education ©2016 [employer] ° °Fetal Movement Counts °Patient Name: __________________________________________________ Patient Due Date: ____________________ °Performing a fetal movement count is highly recommended in high-risk pregnancies, but it is good for every pregnant woman to do. Your health care provider may ask you to start counting fetal movements at 28 weeks of the  pregnancy. Fetal movements often increase: °· After eating a full meal. °· After physical activity. °· After eating or drinking something sweet or cold. °· At rest. °Pay attention to when you feel the baby is most active. This will help you notice a pattern of your baby's sleep and wake cycles and what factors contribute to an increase in fetal movement. It is important to perform a fetal movement count at the same time each day when your baby is normally most active.  °HOW TO COUNT FETAL MOVEMENTS °5. Find a quiet and comfortable area to sit or lie down on your left side. Lying on your left side provides the best blood and oxygen circulation to your baby. °6. Write down the day and time on a sheet of paper or in a journal. °7. Start counting kicks, flutters, swishes, rolls, or jabs in a 2-hour period. You should feel at least 10 movements within 2 hours. °8. If you do not feel 10 movements in 2 hours, wait 2-3 hours and count again. Look for a change in the pattern or not enough counts in 2 hours. °SEEK MEDICAL CARE IF: °· You feel less than 10 counts in 2 hours, tried twice. °· There is no movement in over an hour. °· The pattern is changing or taking longer each day to reach 10 counts in 2 hours. °· You feel the baby is not moving as he or she usually does. °Date: ____________ Movements: ____________ Start time: ____________ Finish time: ____________  °Date: ____________ Movements: ____________ Start time: ____________ Finish time: ____________ °Date: ____________ Movements: ____________ Start time: ____________ Finish time: ____________ °Date: ____________ Movements: ____________ Start time: ____________ Finish time: ____________ °Date: ____________ Movements: ____________ Start time: ____________ Finish time: ____________ °Date: ____________ Movements: ____________ Start time: ____________ Finish time: ____________ °Date: ____________ Movements: ____________ Start time: ____________ Finish time:  ____________ °Date: ____________ Movements: ____________ Start time: ____________ Finish time: ____________  °Date: ____________ Movements: ____________ Start time: ____________ Finish time: ____________ °Date: ____________ Movements: ____________ Start time: ____________ Finish time: ____________ °Date: ____________ Movements: ____________ Start time: ____________ Finish time: ____________ °Date: ____________ Movements: ____________ Start time: ____________ Finish time: ____________ °Date: ____________ Movements: ____________ Start time: ____________ Finish time: ____________ °Date: ____________ Movements: ____________ Start time: ____________ Finish time: ____________ °Date: ____________ Movements: ____________ Start time: ____________ Finish time: ____________  °Date: ____________ Movements: ____________ Start time: ____________ Finish time: ____________ °Date: ____________ Movements: ____________ Start time: ____________ Finish time: ____________ °Date: ____________ Movements: ____________ Start time: ____________ Finish time: ____________ °Date: ____________ Movements: ____________ Start time: ____________ Finish time: ____________ °Date: ____________ Movements: ____________ Start time: ____________ Finish time: ____________ °Date: ____________ Movements: ____________ Start time: ____________ Finish time: ____________ °Date: ____________ Movements: ____________ Start time: ____________ Finish time: ____________  °Date: ____________ Movements: ____________ Start time: ____________ Finish time: ____________ °Date: ____________ Movements: ____________ Start time: ____________ Finish time: ____________ °Date: ____________ Movements: ____________ Start time: ____________ Finish time: ____________ °Date: ____________ Movements: ____________ Start time: ____________ Finish time: ____________ °Date: ____________ Movements: ____________ Start time: ____________ Finish time: ____________ °Date: ____________ Movements:  ____________ Start time: ____________ Finish   time: ____________ °Date: ____________ Movements: ____________ Start time: ____________ Finish time: ____________  °Date: ____________ Movements: ____________ Start time: ____________ Finish time: ____________ °Date: ____________ Movements: ____________ Start time: ____________ Finish time: ____________ °Date: ____________ Movements: ____________ Start time: ____________ Finish time: ____________ °Date: ____________ Movements: ____________ Start time: ____________ Finish time: ____________ °Date: ____________ Movements: ____________ Start time: ____________ Finish time: ____________ °Date: ____________ Movements: ____________ Start time: ____________ Finish time: ____________ °Date: ____________ Movements: ____________ Start time: ____________ Finish time: ____________  °Date: ____________ Movements: ____________ Start time: ____________ Finish time: ____________ °Date: ____________ Movements: ____________ Start time: ____________ Finish time: ____________ °Date: ____________ Movements: ____________ Start time: ____________ Finish time: ____________ °Date: ____________ Movements: ____________ Start time: ____________ Finish time: ____________ °Date: ____________ Movements: ____________ Start time: ____________ Finish time: ____________ °Date: ____________ Movements: ____________ Start time: ____________ Finish time: ____________ °Date: ____________ Movements: ____________ Start time: ____________ Finish time: ____________  °Date: ____________ Movements: ____________ Start time: ____________ Finish time: ____________ °Date: ____________ Movements: ____________ Start time: ____________ Finish time: ____________ °Date: ____________ Movements: ____________ Start time: ____________ Finish time: ____________ °Date: ____________ Movements: ____________ Start time: ____________ Finish time: ____________ °Date: ____________ Movements: ____________ Start time: ____________ Finish  time: ____________ °Date: ____________ Movements: ____________ Start time: ____________ Finish time: ____________ °Date: ____________ Movements: ____________ Start time: ____________ Finish time: ____________  °Date: ____________ Movements: ____________ Start time: ____________ Finish time: ____________ °Date: ____________ Movements: ____________ Start time: ____________ Finish time: ____________ °Date: ____________ Movements: ____________ Start time: ____________ Finish time: ____________ °Date: ____________ Movements: ____________ Start time: ____________ Finish time: ____________ °Date: ____________ Movements: ____________ Start time: ____________ Finish time: ____________ °Date: ____________ Movements: ____________ Start time: ____________ Finish time: ____________ °  °This information is not intended to replace advice given to you by your health care provider. Make sure you discuss any questions you have with your health care provider. °  °Document Released: 12/11/2006 Document Revised: 12/02/2014 Document Reviewed: 09/07/2012 °Elsevier Interactive Patient Education ©2016 [employer] Inc. ° °

## 2016-05-20 NOTE — MAU Provider Note (Signed)
History     CSN: [id number]  Arrival date and time: 05/20/16 1703   First Provider Initiated Contact with Patient 05/20/16 1821      Chief Complaint  Patient presents with  . Leg Swelling  . Headache  . Abdominal Pain   HPI Paula [name] is a [age] y.o. G1P0 at 4464w5d who presents with lower extremity swelling, headache, & abdominal pain.  Reports headache since this morning. Rates pain 6/10. Has not treated. Has noticed lower extremity for the last several weeks; wears compression hose occasionally. Denies vision changes, epigastric pain, CP, or SOB.   Some lower abdominal pain & tightening. Has occurred 3 times today. Denies LOF or vaginal bleeding. Positive fetal movement.  Denies hx of hypertension.   OB History    Gravida Para Term Preterm AB TAB SAB Ectopic Multiple Living   1               Past Medical History  Diagnosis Date  . Medical history non-contributory     Past Surgical History  Procedure Laterality Date  . Wisdom tooth extraction      History reviewed. No pertinent family history.  Social History  Substance Use Topics  . Smoking status: Never Smoker   . Smokeless tobacco: Never Used  . Alcohol Use: No    Allergies: No Known Allergies  Prescriptions prior to admission  Medication Sig Dispense Refill Last Dose  . IRON PO Take 1 tablet by mouth daily.    Past Week at Unknown time  . Prenatal Vit-Fe Fumarate-FA (PRENATAL MULTIVITAMIN) TABS tablet Take 1 tablet by mouth daily at 12 noon.   05/20/2016 at Unknown time    Review of Systems  Constitutional: Negative.   Eyes: Negative for blurred vision and photophobia.  Respiratory: Negative.   Cardiovascular: Positive for leg swelling. Negative for chest pain.  Gastrointestinal: Positive for abdominal pain. Negative for nausea, vomiting, diarrhea and constipation.  Genitourinary: Negative.   Neurological: Positive for headaches.   Physical Exam   Blood pressure 137/74, pulse 91,  temperature 97.8 F (36.6 C), temperature source Oral, resp. rate 18, last menstrual period 07/13/2015.  Patient Vitals for the past 24 hrs:  BP Temp Temp src Pulse Resp  05/20/16 1832 131/77 mmHg - - 92 -  05/20/16 1817 128/67 mmHg - - 92 -  05/20/16 1802 134/67 mmHg - - 96 -  05/20/16 1754 144/70 mmHg - - 94 -  05/20/16 1729 137/74 mmHg 97.8 F (36.6 C) Oral 91 18    Physical Exam  Nursing note and vitals reviewed. Constitutional: She is oriented to person, place, and time. She appears well-developed and well-nourished. No distress.  HENT:  Head: Normocephalic and atraumatic.  Eyes: Conjunctivae are normal. Right eye exhibits no discharge. Left eye exhibits no discharge. No scleral icterus.  Neck: Normal range of motion.  Cardiovascular: Normal rate, regular rhythm and normal heart sounds.   No murmur heard. Respiratory: Effort normal and breath sounds normal. No respiratory distress. She has no wheezes.  GI: Soft.  Musculoskeletal: She exhibits edema (BLE, 2+).  Neurological: She is alert and oriented to person, place, and time. She has normal reflexes.  No clonus  Skin: Skin is warm and dry. She is not diaphoretic.  Psychiatric: She has a normal mood and affect. Her behavior is normal. Judgment and thought content normal.    MAU Course  Procedures Results for orders placed or performed during the hospital encounter of 05/20/16 (from the past 24 hour(s))  Urinalysis, Routine w reflex microscopic (not [facility])     Status: Abnormal   Collection Time: 05/20/16  5:35 PM  Result Value Ref Range   Color, Urine YELLOW YELLOW   APPearance CLEAR CLEAR   Specific Gravity, Urine >1.030 (H) 1.005 - 1.030   pH 6.0 5.0 - 8.0   Glucose, UA NEGATIVE NEGATIVE mg/dL   Hgb urine dipstick NEGATIVE NEGATIVE   Bilirubin Urine NEGATIVE NEGATIVE   Ketones, ur NEGATIVE NEGATIVE mg/dL   Protein, ur NEGATIVE NEGATIVE mg/dL   Nitrite NEGATIVE NEGATIVE   Leukocytes, UA NEGATIVE NEGATIVE   Protein / creatinine ratio, urine     Status: Abnormal   Collection Time: 05/20/16  5:35 PM  Result Value Ref Range   Creatinine, Urine 176.00 mg/dL   Total Protein, Urine 45 mg/dL   Protein Creatinine Ratio 0.26 (H) 0.00 - 0.15 mg/mg[Cre]  CBC     Status: Abnormal   Collection Time: 05/20/16  6:44 PM  Result Value Ref Range   WBC 7.8 4.0 - 10.5 K/uL   RBC 3.27 (L) 3.87 - 5.11 MIL/uL   Hemoglobin 9.9 (L) 12.0 - 15.0 g/dL   HCT 16.129.7 (L) 09.636.0 - 04.546.0 %   MCV 90.8 78.0 - 100.0 fL   MCH 30.3 26.0 - 34.0 pg   MCHC 33.3 30.0 - 36.0 g/dL   RDW 40.914.4 81.111.5 - 91.415.5 %   Platelets 213 150 - 400 K/uL  Comprehensive metabolic panel     Status: Abnormal   Collection Time: 05/20/16  6:44 PM  Result Value Ref Range   Sodium 138 135 - 145 mmol/L   Potassium 4.1 3.5 - 5.1 mmol/L   Chloride 106 101 - 111 mmol/L   CO2 27 22 - 32 mmol/L   Glucose, Bld 75 65 - 99 mg/dL   BUN 9 6 - 20 mg/dL   Creatinine, Ser 7.820.75 0.44 - 1.00 mg/dL   Calcium 9.2 8.9 - 95.610.3 mg/dL   Total Protein 6.8 6.5 - 8.1 g/dL   Albumin 2.8 (L) 3.5 - 5.0 g/dL   AST 18 15 - 41 U/L   ALT 17 14 - 54 U/L   Alkaline Phosphatase 115 38 - 126 U/L   Total Bilirubin 0.5 0.3 - 1.2 mg/dL   GFR calc non Af Amer >60 >60 mL/min   GFR calc Af Amer >60 >60 mL/min   Anion gap 5 5 - 15  Lactate dehydrogenase     Status: None   Collection Time: 05/20/16  6:44 PM  Result Value Ref Range   LDH 119 98 - 192 U/L  Uric acid     Status: None   Collection Time: 05/20/16  6:44 PM  Result Value Ref Range   Uric Acid, Serum 6.0 2.3 - 6.6 mg/dL    MDM Reactive tracing No severe range BPs Tylenol 650 mg PO Fetal deceleration -- lasted just under 2 minutes -- 2 small variables after -- pt sent for BPP Normal HDP labs BPP 8/8, normal AFI S/w Dr. Richardson Doppole -- Ok to discharge home, keep f/u on Wednesday Assessment and Plan  A: 1. Braxton Hick's contraction   2. Fetal heart deceleration   3. [redacted] weeks gestation of pregnancy   4. Transient hypertension  of pregnancy in third trimester     P: Discharge home Discussed reasons to return to MAU Keep f/u with OB Increase water intake & wear compression hose daily   Judeth Hornrin Margia Wiesen 05/20/2016, 6:18 PM

## 2016-05-28 ENCOUNTER — Encounter (HOSPITAL_COMMUNITY): Payer: Self-pay | Admitting: *Deleted

## 2016-05-28 ENCOUNTER — Encounter (HOSPITAL_COMMUNITY)
Admission: AD | Disposition: A | Discharge status: Home / Self Care | Payer: Self-pay | Source: Ambulatory Visit | Attending: Obstetrics and Gynecology

## 2016-05-28 ENCOUNTER — Inpatient Hospital Stay (HOSPITAL_COMMUNITY)
Admission: AD | Admit: 2016-05-28 | Discharge: 2016-05-31 | DRG: 765 | Disposition: A | Discharge status: Home / Self Care | Payer: Medicaid Other | Source: Ambulatory Visit | Attending: Obstetrics and Gynecology | Admitting: Obstetrics and Gynecology

## 2016-05-28 ENCOUNTER — Inpatient Hospital Stay (HOSPITAL_COMMUNITY): Payer: Medicaid Other | Admitting: Anesthesiology

## 2016-05-28 DIAGNOSIS — O99214 Obesity complicating childbirth: Secondary | ICD-10-CM | POA: Diagnosis present

## 2016-05-28 DIAGNOSIS — Z98891 History of uterine scar from previous surgery: Secondary | ICD-10-CM | POA: Diagnosis present

## 2016-05-28 DIAGNOSIS — Z3A37 37 weeks gestation of pregnancy: Secondary | ICD-10-CM

## 2016-05-28 DIAGNOSIS — O4593 Premature separation of placenta, unspecified, third trimester: Secondary | ICD-10-CM | POA: Diagnosis present

## 2016-05-28 DIAGNOSIS — O9962 Diseases of the digestive system complicating childbirth: Secondary | ICD-10-CM | POA: Diagnosis not present

## 2016-05-28 DIAGNOSIS — K219 Gastro-esophageal reflux disease without esophagitis: Secondary | ICD-10-CM | POA: Diagnosis present

## 2016-05-28 DIAGNOSIS — O9081 Anemia of the puerperium: Secondary | ICD-10-CM | POA: Diagnosis not present

## 2016-05-28 DIAGNOSIS — O358XX0 Maternal care for other (suspected) fetal abnormality and damage, not applicable or unspecified: Secondary | ICD-10-CM | POA: Diagnosis not present

## 2016-05-28 DIAGNOSIS — Z6841 Body Mass Index (BMI) 40.0 and over, adult: Secondary | ICD-10-CM

## 2016-05-28 DIAGNOSIS — D62 Acute posthemorrhagic anemia: Secondary | ICD-10-CM | POA: Diagnosis not present

## 2016-05-28 DIAGNOSIS — IMO0001 Reserved for inherently not codable concepts without codable children: Secondary | ICD-10-CM

## 2016-05-28 LAB — TYPE AND SCREEN
ABO/RH(D): O POS
Antibody Screen: NEGATIVE

## 2016-05-28 LAB — URINALYSIS, ROUTINE W REFLEX MICROSCOPIC
Bilirubin Urine: NEGATIVE
Glucose, UA: NEGATIVE mg/dL
Ketones, ur: NEGATIVE mg/dL
Leukocytes, UA: NEGATIVE
Nitrite: NEGATIVE
Protein, ur: 30 mg/dL — AB
Specific Gravity, Urine: 1.015 (ref 1.005–1.030)
pH: 6 (ref 5.0–8.0)

## 2016-05-28 LAB — CBC
HCT: 30.7 % — ABNORMAL LOW (ref 36.0–46.0)
Hemoglobin: 10.2 g/dL — ABNORMAL LOW (ref 12.0–15.0)
MCH: 29.8 pg (ref 26.0–34.0)
MCHC: 33.2 g/dL (ref 30.0–36.0)
MCV: 89.8 fL (ref 78.0–100.0)
Platelets: 221 10*3/uL (ref 150–400)
RBC: 3.42 MIL/uL — ABNORMAL LOW (ref 3.87–5.11)
RDW: 14.5 % (ref 11.5–15.5)
WBC: 8.7 10*3/uL (ref 4.0–10.5)

## 2016-05-28 LAB — COMPREHENSIVE METABOLIC PANEL
ALT: 19 U/L (ref 14–54)
AST: 19 U/L (ref 15–41)
Albumin: 2.6 g/dL — ABNORMAL LOW (ref 3.5–5.0)
Alkaline Phosphatase: 135 U/L — ABNORMAL HIGH (ref 38–126)
Anion gap: 7 (ref 5–15)
BUN: 6 mg/dL (ref 6–20)
CO2: 22 mmol/L (ref 22–32)
Calcium: 8.8 mg/dL — ABNORMAL LOW (ref 8.9–10.3)
Chloride: 106 mmol/L (ref 101–111)
Creatinine, Ser: 0.57 mg/dL (ref 0.44–1.00)
GFR calc Af Amer: >60 mL/min (ref >60)
GFR calc non Af Amer: >60 mL/min (ref >60)
Glucose, Bld: 71 mg/dL (ref 65–99)
Potassium: 3.8 mmol/L (ref 3.5–5.1)
Sodium: 135 mmol/L (ref 135–145)
Total Bilirubin: 0.6 mg/dL (ref 0.3–1.2)
Total Protein: 6.3 g/dL — ABNORMAL LOW (ref 6.5–8.1)

## 2016-05-28 LAB — URINE MICROSCOPIC-ADD ON

## 2016-05-28 SURGERY — Surgical Case
Anesthesia: Epidural

## 2016-05-28 MED ORDER — TERBUTALINE SULFATE 1 MG/ML IJ SOLN
0.2500 mg | Freq: Once | INTRAMUSCULAR | Status: AC
  Administered 2016-05-28: 0.25 mg via SUBCUTANEOUS

## 2016-05-28 MED ORDER — ONDANSETRON HCL 4 MG/2ML IJ SOLN: 4.0000 mg | Freq: Four times a day (QID) | INTRAMUSCULAR | Status: DC | PRN

## 2016-05-28 MED ORDER — LACTATED RINGERS IV SOLN: 500.0000 mL | Freq: Once | INTRAVENOUS | Status: DC

## 2016-05-28 MED ORDER — OXYTOCIN 10 UNIT/ML IJ SOLN
40.0000 [IU] | INTRAVENOUS | Status: DC | PRN
  Administered 2016-05-28: 40 [IU] via INTRAVENOUS

## 2016-05-28 MED ORDER — SOD CITRATE-CITRIC ACID 500-334 MG/5ML PO SOLN
30.0000 mL | ORAL | Status: DC | PRN
  Administered 2016-05-28: 30 mL via ORAL
  Filled 2016-05-28: qty 15

## 2016-05-28 MED ORDER — OXYCODONE-ACETAMINOPHEN 5-325 MG PO TABS: 2.0000 | ORAL_TABLET | ORAL | Status: DC | PRN

## 2016-05-28 MED ORDER — OXYTOCIN BOLUS FROM INFUSION: 500.0000 mL | INTRAVENOUS | Status: DC

## 2016-05-28 MED ORDER — LACTATED RINGERS IV SOLN
INTRAVENOUS | Status: DC
  Administered 2016-05-28 (×2): via INTRAVENOUS

## 2016-05-28 MED ORDER — PHENYLEPHRINE 40 MCG/ML (10ML) SYRINGE FOR IV PUSH (FOR BLOOD PRESSURE SUPPORT): 80.0000 ug | PREFILLED_SYRINGE | INTRAVENOUS | Status: DC | PRN

## 2016-05-28 MED ORDER — BUPIVACAINE HCL (PF) 0.25 % IJ SOLN
INTRAMUSCULAR | Status: AC
  Filled 2016-05-28: qty 20

## 2016-05-28 MED ORDER — OXYTOCIN 10 UNIT/ML IJ SOLN
INTRAMUSCULAR | Status: AC
  Filled 2016-05-28: qty 4

## 2016-05-28 MED ORDER — EPHEDRINE 5 MG/ML INJ: 10.0000 mg | INTRAVENOUS | Status: DC | PRN

## 2016-05-28 MED ORDER — MORPHINE SULFATE (PF) 0.5 MG/ML IJ SOLN
INTRAMUSCULAR | Status: DC | PRN
  Administered 2016-05-28: 4 mg via EPIDURAL

## 2016-05-28 MED ORDER — LIDOCAINE HCL (PF) 1 % IJ SOLN: 30.0000 mL | INTRAMUSCULAR | Status: DC | PRN

## 2016-05-28 MED ORDER — SCOPOLAMINE 1 MG/3DAYS TD PT72
MEDICATED_PATCH | TRANSDERMAL | Status: AC
  Filled 2016-05-28: qty 1

## 2016-05-28 MED ORDER — LACTATED RINGERS IV SOLN
INTRAVENOUS | Status: DC | PRN
  Administered 2016-05-28: 22:00:00 via INTRAVENOUS

## 2016-05-28 MED ORDER — LACTATED RINGERS IV SOLN
INTRAVENOUS | Status: DC
  Administered 2016-05-28: 17:00:00 via INTRAUTERINE

## 2016-05-28 MED ORDER — LIDOCAINE-EPINEPHRINE (PF) 2 %-1:200000 IJ SOLN
INTRAMUSCULAR | Status: DC | PRN
  Administered 2016-05-28 (×2): 5 mL via EPIDURAL

## 2016-05-28 MED ORDER — LACTATED RINGERS IV SOLN
500.0000 mL | INTRAVENOUS | Status: DC | PRN
  Administered 2016-05-28 (×2): 500 mL via INTRAVENOUS

## 2016-05-28 MED ORDER — BUPIVACAINE HCL (PF) 0.25 % IJ SOLN
INTRAMUSCULAR | Status: DC | PRN
  Administered 2016-05-28: 20 mL

## 2016-05-28 MED ORDER — MORPHINE SULFATE (PF) 0.5 MG/ML IJ SOLN
INTRAMUSCULAR | Status: AC
  Filled 2016-05-28: qty 10

## 2016-05-28 MED ORDER — OXYTOCIN 40 UNITS IN LACTATED RINGERS INFUSION - SIMPLE MED: 2.5000 [IU]/h | INTRAVENOUS | Status: DC

## 2016-05-28 MED ORDER — ONDANSETRON HCL 4 MG/2ML IJ SOLN
INTRAMUSCULAR | Status: DC | PRN
  Administered 2016-05-28: 4 mg via INTRAVENOUS

## 2016-05-28 MED ORDER — ACETAMINOPHEN 325 MG PO TABS: 650.0000 mg | ORAL_TABLET | ORAL | Status: DC | PRN

## 2016-05-28 MED ORDER — PHENYLEPHRINE 40 MCG/ML (10ML) SYRINGE FOR IV PUSH (FOR BLOOD PRESSURE SUPPORT)
80.0000 ug | PREFILLED_SYRINGE | INTRAVENOUS | Status: DC | PRN
  Filled 2016-05-28: qty 10

## 2016-05-28 MED ORDER — SODIUM BICARBONATE 8.4 % IV SOLN
INTRAVENOUS | Status: AC
  Filled 2016-05-28: qty 50

## 2016-05-28 MED ORDER — FENTANYL 2.5 MCG/ML BUPIVACAINE 1/10 % EPIDURAL INFUSION (WH - ANES)
14.0000 mL/h | INTRAMUSCULAR | Status: DC | PRN
  Administered 2016-05-28: 14 mL/h via EPIDURAL
  Filled 2016-05-28: qty 125

## 2016-05-28 MED ORDER — LACTATED RINGERS IV SOLN: INTRAVENOUS | Status: DC

## 2016-05-28 MED ORDER — LIDOCAINE HCL (PF) 1 % IJ SOLN
INTRAMUSCULAR | Status: DC | PRN
  Administered 2016-05-28 (×2): 4 mL via EPIDURAL

## 2016-05-28 MED ORDER — SCOPOLAMINE 1 MG/3DAYS TD PT72
MEDICATED_PATCH | TRANSDERMAL | Status: DC | PRN
  Administered 2016-05-28: 1 via TRANSDERMAL

## 2016-05-28 MED ORDER — OXYCODONE-ACETAMINOPHEN 5-325 MG PO TABS: 1.0000 | ORAL_TABLET | ORAL | Status: DC | PRN

## 2016-05-28 MED ORDER — TERBUTALINE SULFATE 1 MG/ML IJ SOLN
INTRAMUSCULAR | Status: AC
  Filled 2016-05-28: qty 1

## 2016-05-28 MED ORDER — FENTANYL CITRATE (PF) 100 MCG/2ML IJ SOLN: 50.0000 ug | INTRAMUSCULAR | Status: DC | PRN

## 2016-05-28 MED ORDER — DEXTROSE 5 % IV SOLN
3.0000 g | Freq: Once | INTRAVENOUS | Status: AC
  Administered 2016-05-28: 3 g via INTRAVENOUS
  Filled 2016-05-28: qty 3000

## 2016-05-28 MED ORDER — DIPHENHYDRAMINE HCL 50 MG/ML IJ SOLN: 12.5000 mg | INTRAMUSCULAR | Status: DC | PRN

## 2016-05-28 MED ORDER — LIDOCAINE-EPINEPHRINE (PF) 2 %-1:200000 IJ SOLN
INTRAMUSCULAR | Status: AC
  Filled 2016-05-28: qty 20

## 2016-05-28 MED ORDER — LACTATED RINGERS IV SOLN
INTRAVENOUS | Status: DC | PRN
  Administered 2016-05-28 (×3): via INTRAVENOUS

## 2016-05-28 MED ORDER — ONDANSETRON HCL 4 MG/2ML IJ SOLN
INTRAMUSCULAR | Status: AC
  Filled 2016-05-28: qty 2

## 2016-05-28 SURGICAL SUPPLY — 34 items
BENZOIN TINCTURE PRP APPL 2/3 (GAUZE/BANDAGES/DRESSINGS) ×2 IMPLANT
BOOTIES KNEE HIGH SLOAN (MISCELLANEOUS) ×4 IMPLANT
CLAMP CORD UMBIL (MISCELLANEOUS) IMPLANT
CLOTH BEACON ORANGE TIMEOUT ST (SAFETY) ×2 IMPLANT
DRAIN JACKSON PRT FLT 10 (DRAIN) IMPLANT
DRSG OPSITE POSTOP 4X10 (GAUZE/BANDAGES/DRESSINGS) ×2 IMPLANT
DURAPREP 26ML APPLICATOR (WOUND CARE) ×2 IMPLANT
ELECT REM PT RETURN 9FT ADLT (ELECTROSURGICAL) ×2
ELECTRODE REM PT RTRN 9FT ADLT (ELECTROSURGICAL) ×1 IMPLANT
EVACUATOR SILICONE 100CC (DRAIN) IMPLANT
EXTRACTOR VACUUM M CUP 4 TUBE (SUCTIONS) IMPLANT
GLOVE BIOGEL PI IND STRL 7.0 (GLOVE) ×2 IMPLANT
GLOVE BIOGEL PI INDICATOR 7.0 (GLOVE) ×2
GLOVE ECLIPSE 6.5 STRL STRAW (GLOVE) ×2 IMPLANT
GOWN STRL REUS W/TWL LRG LVL3 (GOWN DISPOSABLE) ×4 IMPLANT
KIT ABG SYR 3ML LUER SLIP (SYRINGE) ×2 IMPLANT
NEEDLE HYPO 22GX1.5 SAFETY (NEEDLE) ×2 IMPLANT
NEEDLE HYPO 25X5/8 SAFETYGLIDE (NEEDLE) ×2 IMPLANT
NS IRRIG 1000ML POUR BTL (IV SOLUTION) ×4 IMPLANT
PACK C SECTION WH (CUSTOM PROCEDURE TRAY) ×2 IMPLANT
PAD OB MATERNITY 4.3X12.25 (PERSONAL CARE ITEMS) ×2 IMPLANT
PENCIL SMOKE EVAC W/HOLSTER (ELECTROSURGICAL) ×2 IMPLANT
RTRCTR C-SECT PINK 25CM LRG (MISCELLANEOUS) ×2 IMPLANT
STRIP CLOSURE SKIN 1/2X4 (GAUZE/BANDAGES/DRESSINGS) ×2 IMPLANT
SUT MNCRL AB 3-0 PS2 27 (SUTURE) ×2 IMPLANT
SUT SILK 2 0 FSL 18 (SUTURE) IMPLANT
SUT VIC AB 0 CTX 36 (SUTURE) ×2
SUT VIC AB 0 CTX36XBRD ANBCTRL (SUTURE) ×2 IMPLANT
SUT VIC AB 1 CT1 36 (SUTURE) ×4 IMPLANT
SUT VIC AB 2-0 CT1 27 (SUTURE)
SUT VIC AB 2-0 CT1 TAPERPNT 27 (SUTURE) IMPLANT
SYR 20CC LL (SYRINGE) ×2 IMPLANT
TOWEL OR 17X24 6PK STRL BLUE (TOWEL DISPOSABLE) ×2 IMPLANT
TRAY FOLEY CATH SILVER 14FR (SET/KITS/TRAYS/PACK) ×2 IMPLANT

## 2016-05-28 NOTE — Anesthesia Postprocedure Evaluation (Signed)
Anesthesia Post Note  Patient: Paula [name]  Procedure(s) Performed: Procedure(s) (LRB): CESAREAN SECTION (N/A)  Patient location during evaluation: PACU Anesthesia Type: Epidural Level of consciousness: awake and alert and oriented Pain management: pain level controlled Vital Signs Assessment: post-procedure vital signs reviewed and stable Respiratory status: spontaneous breathing, nonlabored ventilation and respiratory function stable Cardiovascular status: blood pressure returned to baseline and stable Postop Assessment: no signs of nausea or vomiting, patient able to bend at knees, epidural receding, no backache and no headache Anesthetic complications: no     Last Vitals:  Filed Vitals:   05/28/16 2300 05/28/16 2315  BP: 128/109 144/58  Pulse: 117 113  Temp: 37.3 C   Resp:  21    Last Pain:  Filed Vitals:   05/28/16 2334  PainSc: 0-No pain   Pain Goal: Patients Stated Pain Goal: 6 (05/28/16 1304)               Sherie Dobrowolski A.

## 2016-05-28 NOTE — Progress Notes (Signed)
Patient ID: Paula [name], female   DOB: 04/20/1991, 25 y.o.   MRN: [id number]   Called to room by RN.  Dr. Estanislado Pandyivard in Cesarean Section and would like IUPC placed.  Pt consented for procedure.  IUPC placed without complication.  Cervical exam still 4 cm.   [name],[name], MD

## 2016-05-28 NOTE — Progress Notes (Signed)
Dr. Estanislado Pandyivard notified of pt in MAU.  Notified that pt is a G1P0 at 3754w6d.  Notified that pt came in with complaints of bleeding and contractions that started at 1000 this morning.  Notified that bleeding has collected on her underwear and there were a few clots in the toilet.  Notified that pt had a streak on her underwear that was just worn from the house to the hospital.  Provider states to check the pt's cervix and report back.

## 2016-05-28 NOTE — Progress Notes (Signed)
Assuming care of Paula [name], 25 yo G1P0 @ 37.6 wks admitted in latent labor. Family at bedside.  Subjective: Comfortable w/ epidural. Feeling intermittent vaginal pressure. Continues to leak blood tinged fluid.   Objective: BP 126/74 mmHg  Pulse 76  Temp(Src) 98.9 F (37.2 C) (Oral)  Resp 18  Ht 5\' 4"  (1.626 m)  Wt 115.214 kg (254 lb)  BMI 43.58 kg/m2  SpO2 99%  LMP 07/13/2015 Today's Vitals   05/28/16 1730 05/28/16 1731 05/28/16 1801 05/28/16 1859  BP:  124/71 112/75 126/74  Pulse:  80 92 76  Temp:      TempSrc:      Resp:    18  Height:      Weight:      SpO2: 99%     PainSc:  0-No pain     BPs 125-150/71-92 between 12:14-16:40. CMP and CBC normal. Pt asymptomatic. Results for orders placed or performed during the hospital encounter of 05/28/16 (from the past 24 hour(s))  Urinalysis, Routine w reflex microscopic (not at [facility])     Status: Abnormal   Collection Time: 05/28/16 12:05 PM  Result Value Ref Range   Color, Urine YELLOW YELLOW   APPearance CLEAR CLEAR   Specific Gravity, Urine 1.015 1.005 - 1.030   pH 6.0 5.0 - 8.0   Glucose, UA NEGATIVE NEGATIVE mg/dL   Hgb urine dipstick LARGE (A) NEGATIVE   Bilirubin Urine NEGATIVE NEGATIVE   Ketones, ur NEGATIVE NEGATIVE mg/dL   Protein, ur 30 (A) NEGATIVE mg/dL   Nitrite NEGATIVE NEGATIVE   Leukocytes, UA NEGATIVE NEGATIVE  Urine microscopic-add on     Status: Abnormal   Collection Time: 05/28/16 12:05 PM  Result Value Ref Range   Squamous Epithelial / LPF 0-5 (A) NONE SEEN   WBC, UA 0-5 0 - 5 WBC/hpf   RBC / HPF 6-30 0 - 5 RBC/hpf   Bacteria, UA FEW (A) NONE SEEN  Comprehensive metabolic panel     Status: Abnormal   Collection Time: 05/28/16  1:45 PM  Result Value Ref Range   Sodium 135 135 - 145 mmol/L   Potassium 3.8 3.5 - 5.1 mmol/L   Chloride 106 101 - 111 mmol/L   CO2 22 22 - 32 mmol/L   Glucose, Bld 71 65 - 99 mg/dL   BUN 6 6 - 20 mg/dL   Creatinine, Ser 9.600.57 0.44 - 1.00 mg/dL   Calcium 8.8 (L)  8.9 - 10.3 mg/dL   Total Protein 6.3 (L) 6.5 - 8.1 g/dL   Albumin 2.6 (L) 3.5 - 5.0 g/dL   AST 19 15 - 41 U/L   ALT 19 14 - 54 U/L   Alkaline Phosphatase 135 (H) 38 - 126 U/L   Total Bilirubin 0.6 0.3 - 1.2 mg/dL   GFR calc non Af Amer >60 >60 mL/min   GFR calc Af Amer >60 >60 mL/min   Anion gap 7 5 - 15  CBC     Status: Abnormal   Collection Time: 05/28/16  1:45 PM  Result Value Ref Range   WBC 8.7 4.0 - 10.5 K/uL   RBC 3.42 (L) 3.87 - 5.11 MIL/uL   Hemoglobin 10.2 (L) 12.0 - 15.0 g/dL   HCT 45.430.7 (L) 09.836.0 - 11.946.0 %   MCV 89.8 78.0 - 100.0 fL   MCH 29.8 26.0 - 34.0 pg   MCHC 33.2 30.0 - 36.0 g/dL   RDW 14.714.5 82.911.5 - 56.215.5 %   Platelets 221 150 - 400 K/uL  Type and screen [facility]     Status: None   Collection Time: 05/28/16  1:45 PM  Result Value Ref Range   ABO/RH(D) O POS    Antibody Screen NEG    Sample Expiration 05/31/2016     FHT: BL 138 w/ minimal variability, no accels, early decels and occ mild variables (not w/ every ctx) UC:   irregular, every 2-3 minutes, MVUs 285 SVE:   Dilation: 5 Effacement (%): 90 Station: -1 Exam by:: Dr. Estanislado Pandyivard@ 1856 +bloody show Was given 1 dose of Terbutaline 0.25 mg at 1901 due to tachysystole - MVUs were above 300   Assessment:  IUP @ 37.6 wks Latent labor SROM x 5+ hrs; no s/s of infection Cat 2 FHRT (intermittent mild variables) GBS neg Morbid obesity 2VC Fetus w/ microcephaly  Plan: Continue intrauterine resuscitative measures prn CTO closely Continue amnioinfusion Consult prn   Sherre ScarletWILLIAMS, Delecia Vastine CNM 05/28/2016, 7:48 PM

## 2016-05-28 NOTE — Transfer of Care (Signed)
Immediate Anesthesia Transfer of Care Note  Patient: Paula [name]  Procedure(s) Performed: Procedure(s): CESAREAN SECTION (N/A)  Patient Location: PACU  Anesthesia Type:Epidural  Level of Consciousness: awake  Airway & Oxygen Therapy: Patient Spontanous Breathing  Post-op Assessment: Report given to RN and Post -op Vital signs reviewed and stable  Post vital signs: stable  Last Vitals:  Filed Vitals:   05/28/16 2115 05/28/16 2136  BP:    Pulse: 104   Temp:  37.8 C  Resp:      Last Pain:  Filed Vitals:   05/28/16 2137  PainSc: 0-No pain      Patients Stated Pain Goal: 6 (05/28/16 1304)  Complications: No apparent anesthesia complications

## 2016-05-28 NOTE — Progress Notes (Signed)
Dr. Estanislado Pandyivard in surgery. Notified by phone of decelerations and nursing interventions. Dr. Estanislado Pandyivard requesting nurse contact Dr. Penne LashLeggett to place an IUPC and initiate amnioinfusion. Will come to bedside as soon as surgery complete.

## 2016-05-28 NOTE — Progress Notes (Signed)
Paula [name] is a 25 y.o. G1P0 at 6037w6dadmitted for active labor, unexplained bleeding  Subjective:  Comfortable with  Epidural  IUPC replaced easily and scalp lead placed as well Contractions every 1 minutes, lasting 60 seconds: MVU at 285    Objective: BP 126/74 mmHg  Pulse 76  Temp(Src) 98.9 F (37.2 C) (Oral)  Resp 18  Ht 5\' 4"  (1.626 m)  Wt 254 lb (115.214 kg)  BMI 43.58 kg/m2  SpO2 99%  LMP 07/13/2015      FHT:  Now with early decelerations   SVE:   Dilation: 5 Effacement (%): 90 Station: -1 Exam by:: Dr. Estanislado Pandyivard  Labs: Lab Results  Component Value Date   WBC 8.7 05/28/2016   HGB 10.2* 05/28/2016   HCT 30.7* 05/28/2016   MCV 89.8 05/28/2016   PLT 221 05/28/2016    Assessment / Plan: spontaneous labor progressing well but now with tachysytole : suspect marginal abruptio Terbutaline s/q given Reviewed findings with patient and mom. Will continue to monitor closely.   Cesarean section reviewed with pt with R&B including but not limited to:  bleeding, infection, injury to other organs. Low transverse approach planned which will allow vaginal delivery with future pregnancies. Should a vertical incision or inverted T be needed, patient is aware that repeat cesarean sections would be recommended in the future. Expected hospital stay and recovery also discussed.   Paula [name] A 05/28/2016, 7:41 PM

## 2016-05-28 NOTE — Anesthesia Preprocedure Evaluation (Addendum)
Anesthesia Evaluation  Patient identified by MRN, date of birth, ID band Patient awake    Reviewed: Allergy & Precautions, Patient's Chart, lab work & pertinent test results  Airway Mallampati: III  TM Distance: >3 FB Neck ROM: Full    Dental  (+) Teeth Intact   Pulmonary neg pulmonary ROS,    Pulmonary exam normal breath sounds clear to auscultation       Cardiovascular negative cardio ROS Normal cardiovascular exam Rhythm:Regular Rate:Normal     Neuro/Psych negative neurological ROS  negative psych ROS   GI/Hepatic Neg liver ROS, GERD  ,  Endo/Other  Morbid obesity  Renal/GU negative Renal ROS  negative genitourinary   Musculoskeletal negative musculoskeletal ROS (+)   Abdominal (+) + obese,   Peds  Hematology  (+) anemia ,   Anesthesia Other Findings   Reproductive/Obstetrics (+) Pregnancy                             Anesthesia Physical Anesthesia Plan  ASA: III and emergent  Anesthesia Plan: Epidural   Post-op Pain Management:    Induction:   Airway Management Planned: Natural Airway  Additional Equipment:   Intra-op Plan:   Post-operative Plan:   Informed Consent: I have reviewed the patients History and Physical, chart, labs and discussed the procedure including the risks, benefits and alternatives for the proposed anesthesia with the patient or authorized representative who has indicated his/her understanding and acceptance.     Plan Discussed with: Anesthesiologist, CRNA and Surgeon  Anesthesia Plan Comments: (Patient for C/Section for fetal intolerance to labor. Will use epidural for C/Section. M. Avree Szczygiel,MD)       Anesthesia Quick Evaluation

## 2016-05-28 NOTE — Anesthesia Procedure Notes (Signed)
Epidural Patient location during procedure: OB Start time: 05/28/2016 4:29 PM  Staffing Anesthesiologist: Mal AmabileFOSTER, Delfina Schreurs Performed by: anesthesiologist   Preanesthetic Checklist Completed: patient identified, site marked, surgical consent, pre-op evaluation, timeout performed, IV checked, risks and benefits discussed and monitors and equipment checked  Epidural Patient position: sitting Prep: site prepped and draped and DuraPrep Patient monitoring: continuous pulse ox and blood pressure Approach: midline Location: L3-L4 Injection technique: LOR air  Needle:  Needle type: Tuohy  Needle gauge: 17 G Needle length: 9 cm and 9 Needle insertion depth: 7 cm Catheter type: closed end flexible Catheter size: 19 Gauge Catheter at skin depth: 12 cm Test dose: negative and Other  Assessment Events: blood not aspirated, injection not painful, no injection resistance, negative IV test and no paresthesia  Additional Notes Patient identified. Risks and benefits discussed including failed block, incomplete  Pain control, post dural puncture headache, nerve damage, paralysis, blood pressure Changes, nausea, vomiting, reactions to medications-both toxic and allergic and post Partum back pain. All questions were answered. Patient expressed understanding and wished to proceed. Sterile technique was used throughout procedure. Epidural site was Dressed with sterile barrier dressing. No paresthesias, signs of intravascular injection Or signs of intrathecal spread were encountered.  Patient was more comfortable after the epidural was dosed. Please see RN's note for documentation of vital signs and FHR which are stable.

## 2016-05-28 NOTE — Progress Notes (Addendum)
  Subjective:   Objective: BP 131/51 mmHg  Pulse 97  Temp(Src) 98.9 F (37.2 C) (Oral)  Resp 18  Ht 5\' 4"  (1.626 m)  Wt 115.214 kg (254 lb)  BMI 43.58 kg/m2  SpO2 99%  LMP 07/13/2015 Today's Vitals   05/28/16 1859 05/28/16 1900 05/28/16 1930 05/28/16 2000  BP: 126/74  124/51 131/51  Pulse: 76  95 97  Temp:      TempSrc:      Resp: 18     Height:      Weight:      SpO2:  100% 100% 99%  PainSc:        FHT: BL 150 w/ moderate variability, +scalp stim, repetitive lates, +earlys, +variables  UC:   irregular, every 1-2 minutes SVE:   Dilation: 5 Effacement (%): 90 Station: -1 Exam by:: K.Malanie Koloski, CNM@ 20:31   Assessment:  Tachysystole Cat 2 FHRT Latent labor   Plan: IVF bolus 02 Reposition Terb 0.25 mg x 1 now (given at 21:03) Increase amnioinfusion to 150 ml/hr - originally at 100 ml/hr Dr. Estanislado Pandyivard consulted for strip review   Paula [name], Paula [name] CNM 05/28/2016, 8:56 PM  ADDENDUM: C-section called by Dr. Estanislado Pandyivard due to fetal intolerance to labor - remote from delivery  [facility] Preop orders placed, to include Ancef 3 g  Team notified Dr. Estanislado Pandyivard enroute Prep for OR  Paula ScarletKimberly Uziel [name], CNM 05/28/16, 9:32 PM

## 2016-05-28 NOTE — H&P (Signed)
  Paula [name] is a 25 y.o. female, G1P0 presenting at 37+6 weeks  for contractions and vaginal bleeding noted at at 10:30 this am. Denies LOF. Reports good fetal movement Currently contractions every 3 minutes,  4/10 intensity  Pregnancy followed at [facility] since 7+6  weeks and remarkable for:  1. Fetal microcephaly diagnosed at 28 weeks with normal brain anatomy.Patient declined genetic testing.Seen by MFM 2. Low/normal amniotic fluid starting at 28 weeks 3. 2 vessel cord with normal fetal echocardiogram 4. Maternal obesity with BMI 41 5. Abnormal pap with ASCUS / + HPV: normal colpo  OB History    Gravida Para Term Preterm AB TAB SAB Ectopic Multiple Living   1              Past Medical History  Diagnosis Date  . Medical history non-contributory    Past Surgical History  Procedure Laterality Date  . Wisdom tooth extraction      Family History:  Non-contributory  Social History:    reports that she has never smoked. She has never used smokeless tobacco. She reports that she does not drink alcohol or use illicit drugs.   Prenatal labs: ABO, Rh: --/--/O POS (07/04 1345) Antibody: NEG (07/04 1345)negative Rubella: immune RPR: Non Reactive (11/24 0933)  HBsAg:   non reactive HIV: Non Reactive (11/24 0933)  GBS:   negative Tdap: received   Prenatal Transfer Tool  Maternal Diabetes: No Genetic Screening: Declined Maternal Ultrasounds/Referrals: Abnormal:  Findings:   Other:microcephaly, 2 vessel cord Fetal Ultrasounds or other Referrals:  Referred to Materal Fetal Medicine  Maternal Substance Abuse:  No Significant Maternal Medications:  None Significant Maternal Lab Results: None   Dilation: 3.5 Effacement (%): 90 Station: -2 Exam by:: Dr. Estanislado Pandyivard Blood pressure 125/79, pulse 92, temperature 98.6 F (37 C), temperature source Oral, resp. rate 19, height 5\' 4"  (1.626 m), weight 254 lb (115.214 kg), last menstrual period 07/13/2015.  General Appearance:  Alert, appropriate appearance for age. No acute distress HEENT Exam: Grossly normal Chest/Respiratory Exam: Normal chest wall and respirations. Clear to auscultation  Cardiovascular Exam: Regular rate and rhythm. S1, S2, no murmur Gastrointestinal Exam: soft, non-tender, Uterus gravid with size compatible with GA, Vertex presentation by Leopold's maneuvers Psychiatric Exam: Alert and oriented, appropriate affect  ++++++++++++++++++++++++++++++++++++++++++++++++++++++++++++++++  Vaginal exam: 4/90/-2 SROM probable with fetal hair felt  Fetal tracings: Category 1  ++++++++++++++++++++++++++++++++++++++++++++++++++++++++++++++++   Assessment/Plan:  Term pregnancy in early labor with reassuring fetal status SVD expected   Silverio LaySandra Idy Rawling MD 05/28/2016, 4:03 PM

## 2016-05-28 NOTE — Progress Notes (Signed)
Dr. Estanislado Pandyivard notified that the pt is 3, 90, -2 and vertex.

## 2016-05-28 NOTE — Progress Notes (Signed)
S: called in room for decelerations, 2nd episode. IUPC placed by Dr Penne LashLeggett around 17:00 and amnioinfusion started which normalized tracing.      This episode resolved with change of position       Good return from amnioinfusion  O: VE: 4/90/-2   A: fetal tracing improved  P: patient updated on fetal tracings. Understands that persistent or prolonged anomalies may warrant discussion on cesarean section

## 2016-05-28 NOTE — Op Note (Signed)
Preoperative diagnosis: Intrauterine pregnancy at 37 weeks and 6 days. Fetal intolerance to labor  Post operative diagnosis: Same, partial placenta abruptio  Anesthesia: Epidural  Anesthesiologist: Dr. Malen GauzeFoster  Procedure: Primary low transverse cesarean section  Surgeon: Dr. Dois DavenportSandra Delane Wessinger  Assistant: Merlinda FrederickKimberley Williams CNM  Estimated blood loss: 600 cc  Procedure:  After being informed of the planned procedure and possible complications including bleeding, infection, injury to other organs, informed consent is obtained. The patient is taken to OR #9 and pre-existing epidural anesthesia was optimized  without complication. She is placed in the dorsal decubitus position with the pelvis tilted to the left. She is then prepped and draped in [name] sterile fashion. [name] Foley catheter is already in her bladder.  After assessing adequate level of anesthesia, we infiltrate the suprapubic area with 20 cc of Marcaine 0.25 and perform [name] Pfannenstiel incision which is brought down sharply to the fascia. The fascia is entered in [name] low transverse fashion. Linea alba is dissected. Peritoneum is entered in [name] midline fashion. An Alexis retractor is easily positioned.   The myometrium is then entered in [name] low transverse fashion, 2 cm above the vesico-uterine junction ; first with knife and then extended bluntly.Blood clots are evacuated. Amniotic fluid is clear. We assist the birth of [name] female  infant in vertex presentation. Mouth and nose are suctioned. The baby is delivered. The cord is clamped and sectioned. The baby is given to the neonatologist present in the room.  10 cc of blood is drawn from the umbilical vein and an arterial blood gas is obtained. The placenta is allowed to deliver spontaneously. It is complete and the cord has 3 vessels. We note [name] 20% area of placental abruption. Uterine revision is negative.  We proceed with closure of the myometrium in 2 layers: First with [name] running locked suture of 0  Vicryl, then with [name] Lembert suture of 0 Vicryl imbricating the first one. Hemostasis is completed with cauterization on peritoneal edges.  Both paracolic gutters are cleaned. Both tubes and ovaries are assessed and normal. The pelvis is profusely irrigated with warm saline to confirm [name] satisfactory hemostasis.  Retractors and sponges are removed. Under fascia hemostasis is completed with cauterization. The fascia is then closed with 2 running sutures of 0 Vicryl meeting midline. The wound is irrigated with warm saline and hemostasis is completed with cauterization. The skin is closed with [name] subcuticular suture of 3-0 Monocryl and Steri-Strips.  Instrument and sponge count is complete x2. Estimated blood loss is 600 cc.  The procedure is well tolerated by the patient who is taken to recovery room in [name] well and stable condition.  female baby named [name] was born at 22:08 and received an Apgar of 6  at 1 minute and 9 at 5 minutes. Cord pH is 7.13   Specimen: Placenta sent to pathology   [name],Paula Toren A MD 7/4/201710:43 PM

## 2016-05-28 NOTE — Anesthesia Pain Management Evaluation Note (Signed)
  CRNA Pain Management Visit Note  Patient: Paula [name], 25 y.o., female  "Hello I am a member of the anesthesia team at [facility]. We have an anesthesia team available at all times to provide care throughout the hospital, including epidural management and anesthesia for C-section. I don't know your plan for the delivery whether it a natural birth, water birth, IV sedation, nitrous supplementation, doula or epidural, but we want to meet your pain goals."   1.Was your pain managed to your expectations on prior hospitalizations?   No prior hospitalizations  2.What is your expectation for pain management during this hospitalization?     Labor support without medications  3.How can we help you reach that goal? Unsure what she wants.  Record the patient's initial score and the patient's pain goal.   Pain: 4  Pain Goal: 6 [facility] wants you to be able to say your pain was always managed very well.  Cephus ShellingBURGER,Sarabelle Genson 05/28/2016

## 2016-05-28 NOTE — MAU Note (Signed)
Pt states she is having vaginal bleeding that started around 1000 this morning and she is having contractions.

## 2016-05-29 ENCOUNTER — Encounter (HOSPITAL_COMMUNITY): Payer: Self-pay | Admitting: *Deleted

## 2016-05-29 DIAGNOSIS — Z98891 History of uterine scar from previous surgery: Secondary | ICD-10-CM | POA: Diagnosis present

## 2016-05-29 LAB — CBC
HCT: 26.2 % — ABNORMAL LOW (ref 36.0–46.0)
Hemoglobin: 8.8 g/dL — ABNORMAL LOW (ref 12.0–15.0)
MCH: 30.6 pg (ref 26.0–34.0)
MCHC: 33.6 g/dL (ref 30.0–36.0)
MCV: 91 fL (ref 78.0–100.0)
Platelets: 181 10*3/uL (ref 150–400)
RBC: 2.88 MIL/uL — ABNORMAL LOW (ref 3.87–5.11)
RDW: 14.7 % (ref 11.5–15.5)
WBC: 14.3 10*3/uL — ABNORMAL HIGH (ref 4.0–10.5)

## 2016-05-29 MED ORDER — NALOXONE HCL 2 MG/2ML IJ SOSY
1.0000 ug/kg/h | PREFILLED_SYRINGE | INTRAMUSCULAR | Status: DC | PRN
  Filled 2016-05-29: qty 2

## 2016-05-29 MED ORDER — TETANUS-DIPHTH-ACELL PERTUSSIS 5-2.5-18.5 LF-MCG/0.5 IM SUSP: 0.5000 mL | Freq: Once | INTRAMUSCULAR | Status: DC

## 2016-05-29 MED ORDER — NALBUPHINE HCL 10 MG/ML IJ SOLN: 5.0000 mg | Freq: Once | INTRAMUSCULAR | Status: DC | PRN

## 2016-05-29 MED ORDER — SIMETHICONE 80 MG PO CHEW: 80.0000 mg | CHEWABLE_TABLET | ORAL | Status: DC | PRN

## 2016-05-29 MED ORDER — MENTHOL 3 MG MT LOZG: 1.0000 | LOZENGE | OROMUCOSAL | Status: DC | PRN

## 2016-05-29 MED ORDER — KETOROLAC TROMETHAMINE 30 MG/ML IJ SOLN: 30.0000 mg | Freq: Four times a day (QID) | INTRAMUSCULAR | Status: AC | PRN

## 2016-05-29 MED ORDER — IBUPROFEN 600 MG PO TABS
600.0000 mg | ORAL_TABLET | Freq: Four times a day (QID) | ORAL | Status: DC | PRN
  Administered 2016-05-29 – 2016-05-30 (×2): 600 mg via ORAL

## 2016-05-29 MED ORDER — KETOROLAC TROMETHAMINE 30 MG/ML IJ SOLN
INTRAMUSCULAR | Status: AC
  Administered 2016-05-29: 30 mg
  Filled 2016-05-29: qty 1

## 2016-05-29 MED ORDER — NALBUPHINE HCL 10 MG/ML IJ SOLN: 5.0000 mg | INTRAMUSCULAR | Status: DC | PRN

## 2016-05-29 MED ORDER — SIMETHICONE 80 MG PO CHEW
80.0000 mg | CHEWABLE_TABLET | Freq: Three times a day (TID) | ORAL | Status: DC
  Administered 2016-05-29 – 2016-05-31 (×6): 80 mg via ORAL
  Filled 2016-05-29 (×6): qty 1

## 2016-05-29 MED ORDER — DIBUCAINE 1 % RE OINT: 1.0000 "application " | TOPICAL_OINTMENT | RECTAL | Status: DC | PRN

## 2016-05-29 MED ORDER — ZOLPIDEM TARTRATE 5 MG PO TABS: 5.0000 mg | ORAL_TABLET | Freq: Every evening | ORAL | Status: DC | PRN

## 2016-05-29 MED ORDER — FERROUS SULFATE 325 (65 FE) MG PO TABS
325.0000 mg | ORAL_TABLET | Freq: Two times a day (BID) | ORAL | Status: DC
  Administered 2016-05-29 – 2016-05-31 (×5): 325 mg via ORAL
  Filled 2016-05-29 (×5): qty 1

## 2016-05-29 MED ORDER — WITCH HAZEL-GLYCERIN EX PADS: 1.0000 "application " | MEDICATED_PAD | CUTANEOUS | Status: DC | PRN

## 2016-05-29 MED ORDER — ACETAMINOPHEN 500 MG PO TABS
1000.0000 mg | ORAL_TABLET | Freq: Four times a day (QID) | ORAL | Status: AC
  Administered 2016-05-29 (×3): 1000 mg via ORAL
  Filled 2016-05-29 (×3): qty 2

## 2016-05-29 MED ORDER — SODIUM CHLORIDE 0.9% FLUSH: 3.0000 mL | INTRAVENOUS | Status: DC | PRN

## 2016-05-29 MED ORDER — FENTANYL CITRATE (PF) 100 MCG/2ML IJ SOLN: 25.0000 ug | INTRAMUSCULAR | Status: DC | PRN

## 2016-05-29 MED ORDER — DIPHENHYDRAMINE HCL 50 MG/ML IJ SOLN: 12.5000 mg | INTRAMUSCULAR | Status: DC | PRN

## 2016-05-29 MED ORDER — DIPHENHYDRAMINE HCL 25 MG PO CAPS: 25.0000 mg | ORAL_CAPSULE | Freq: Four times a day (QID) | ORAL | Status: DC | PRN

## 2016-05-29 MED ORDER — ONDANSETRON HCL 4 MG/2ML IJ SOLN
4.0000 mg | Freq: Three times a day (TID) | INTRAMUSCULAR | Status: DC | PRN
  Administered 2016-05-29: 4 mg via INTRAVENOUS
  Filled 2016-05-29: qty 2

## 2016-05-29 MED ORDER — PRENATAL MULTIVITAMIN CH
1.0000 | ORAL_TABLET | Freq: Every day | ORAL | Status: DC
  Administered 2016-05-29 – 2016-05-31 (×3): 1 via ORAL
  Filled 2016-05-29 (×3): qty 1

## 2016-05-29 MED ORDER — DIPHENHYDRAMINE HCL 25 MG PO CAPS
25.0000 mg | ORAL_CAPSULE | ORAL | Status: DC | PRN
  Administered 2016-05-29 (×2): 25 mg via ORAL
  Filled 2016-05-29 (×4): qty 1

## 2016-05-29 MED ORDER — MEPERIDINE HCL 25 MG/ML IJ SOLN: 6.2500 mg | INTRAMUSCULAR | Status: DC | PRN

## 2016-05-29 MED ORDER — OXYTOCIN 40 UNITS IN LACTATED RINGERS INFUSION - SIMPLE MED: 2.5000 [IU]/h | INTRAVENOUS | Status: AC

## 2016-05-29 MED ORDER — ACETAMINOPHEN 325 MG PO TABS: 650.0000 mg | ORAL_TABLET | ORAL | Status: DC | PRN

## 2016-05-29 MED ORDER — OXYCODONE-ACETAMINOPHEN 5-325 MG PO TABS
2.0000 | ORAL_TABLET | ORAL | Status: DC | PRN
  Administered 2016-05-30 – 2016-05-31 (×3): 2 via ORAL
  Filled 2016-05-29 (×4): qty 2

## 2016-05-29 MED ORDER — NALOXONE HCL 0.4 MG/ML IJ SOLN: 0.4000 mg | INTRAMUSCULAR | Status: DC | PRN

## 2016-05-29 MED ORDER — LACTATED RINGERS IV SOLN
INTRAVENOUS | Status: DC
  Administered 2016-05-29 (×2): via INTRAVENOUS

## 2016-05-29 MED ORDER — OXYCODONE-ACETAMINOPHEN 5-325 MG PO TABS
1.0000 | ORAL_TABLET | ORAL | Status: DC | PRN
  Administered 2016-05-30 – 2016-05-31 (×2): 1 via ORAL
  Filled 2016-05-29 (×2): qty 1

## 2016-05-29 MED ORDER — SIMETHICONE 80 MG PO CHEW
80.0000 mg | CHEWABLE_TABLET | ORAL | Status: DC
  Administered 2016-05-29 – 2016-05-30 (×2): 80 mg via ORAL
  Filled 2016-05-29 (×2): qty 1

## 2016-05-29 MED ORDER — METHYLERGONOVINE MALEATE 0.2 MG PO TABS: 0.2000 mg | ORAL_TABLET | ORAL | Status: DC | PRN

## 2016-05-29 MED ORDER — IBUPROFEN 600 MG PO TABS
600.0000 mg | ORAL_TABLET | Freq: Four times a day (QID) | ORAL | Status: DC
  Administered 2016-05-29 – 2016-05-31 (×7): 600 mg via ORAL
  Filled 2016-05-29 (×9): qty 1

## 2016-05-29 MED ORDER — SENNOSIDES-DOCUSATE SODIUM 8.6-50 MG PO TABS
2.0000 | ORAL_TABLET | ORAL | Status: DC
  Administered 2016-05-29 – 2016-05-30 (×2): 2 via ORAL
  Filled 2016-05-29 (×2): qty 2

## 2016-05-29 MED ORDER — MEASLES, MUMPS & RUBELLA VAC ~~LOC~~ INJ
0.5000 mL | INJECTION | Freq: Once | SUBCUTANEOUS | Status: DC
  Filled 2016-05-29: qty 0.5

## 2016-05-29 MED ORDER — METHYLERGONOVINE MALEATE 0.2 MG/ML IJ SOLN: 0.2000 mg | INTRAMUSCULAR | Status: DC | PRN

## 2016-05-29 MED ORDER — COCONUT OIL OIL: 1.0000 "application " | TOPICAL_OIL | Status: DC | PRN

## 2016-05-29 NOTE — Lactation Note (Signed)
This note was copied from a baby's chart. Lactation Consultation Note  Initial visit made.  Breastfeeding consultation services and Providing Breastmilk For Your NICU Baby booklet given and reviewed with mom.  Baby was transferred to NICU this AM due to low blood sugars.  This is mom's first baby.  Instructed on pumping and hand expression.  A few small drops of colostrum obtained with hand expression.  Initiated pumping and reviewed pumping schedule with mom.  Questions answered.  [facility]  Patient Name: Paula [name] EhlersCharese Magouirk ZOXWR'UToday's Date: 05/29/2016 Reason for consult: Initial assessment;NICU baby;Infant < 6lbs   Maternal Data    Feeding    LATCH Score/Interventions                      Lactation Tools Discussed/Used WIC Program: Yes Pump Review: Setup, frequency, and cleaning;Milk Storage Initiated by:: LC Date initiated:: 05/29/16   Consult Status Consult Status: Follow-up Date: 05/30/16 Follow-up type: In-patient    Huston FoleyMOULDEN, Ayman Brull S 05/29/2016, 1:25 PM

## 2016-05-29 NOTE — Progress Notes (Signed)
Subjective: Postpartum Day 1: Cesarean Delivery Patient reports some light headedness with standing.  Objective: Vital signs in last 24 hours: Temp:  [98.4 F (36.9 C)-101.8 F (38.8 C)] 98.4 F (36.9 C) (07/05 0608) Pulse Rate:  [76-154] 97 (07/05 0608) Resp:  [13-33] 18 (07/05 0608) BP: (103-157)/(51-109) 134/65 mmHg (07/05 0608) SpO2:  [96 %-100 %] 100 % (07/05 0608) Weight:  [254 lb (115.214 kg)] 254 lb (115.214 kg) (07/04 1304) CBG fasting 93  Physical Exam:  General: alert and no distress Lochia: appropriate Uterine Fundus: firm Incision: dressing c/d/i DVT Evaluation: No evidence of DVT seen on physical exam.   Recent Labs  05/28/16 1345 05/29/16 0503  HGB 10.2* 8.8*  HCT 30.7* 26.2*    Assessment/Plan: Status post Cesarean section. Doing well postoperatively.  Continue current care. FeSO4 D/c CBGs HTN controlled on procardia XL 30mg  daily  [name] 05/29/2016, 12:28 PM

## 2016-05-29 NOTE — Addendum Note (Signed)
Addendum  created 05/29/16 0955 by Junious SilkMelinda Koralyn Prestage, CRNA   Modules edited: Charges VN, Clinical Notes   Clinical Notes:  File: [id number]

## 2016-05-29 NOTE — Anesthesia Postprocedure Evaluation (Signed)
Anesthesia Post Note  Patient: Paula [name]  Procedure(s) Performed: Procedure(s) (LRB): CESAREAN SECTION (N/A)  Patient location during evaluation: Mother Baby Anesthesia Type: Epidural Level of consciousness: awake and alert Pain management: pain level controlled Vital Signs Assessment: post-procedure vital signs reviewed and stable Respiratory status: spontaneous breathing, nonlabored ventilation and respiratory function stable Cardiovascular status: stable Postop Assessment: no headache, no backache and epidural receding Anesthetic complications: no     Last Vitals:  Filed Vitals:   05/29/16 0130 05/29/16 0608  BP: 147/51 134/65  Pulse: 100 97  Temp: 37.1 C 36.9 C  Resp: 18 18    Last Pain:  Filed Vitals:   05/29/16 0909  PainSc: 0-No pain   Pain Goal: Patients Stated Pain Goal: 3 (05/29/16 0909)               Junious SilkGILBERT,[name]

## 2016-05-29 NOTE — Progress Notes (Signed)
Subjective: Postpartum Day 1: Cesarean Delivery Patient is sleeping comfortably in the bed.    Objective: Vital signs in last 24 hours: Temp:  [98.4 F (36.9 C)-101.8 F (38.8 C)] 98.4 F (36.9 C) (07/05 0608) Pulse Rate:  [76-154] 97 (07/05 0608) Resp:  [13-33] 18 (07/05 0608) BP: (103-157)/(51-109) 134/65 mmHg (07/05 0608) SpO2:  [96 %-100 %] 100 % (07/05 16100608) Weight:  [254 lb (115.214 kg)] 254 lb (115.214 kg) (07/04 1304)  Physical Exam:  General: sleeping Lochia: appropriate per report Uterine Fundus: firm per report Incision: dressing intact DVT Evaluation: No evidence of DVT seen on physical exam.   Recent Labs  05/28/16 1345 05/29/16 0503  HGB 10.2* 8.8*  HCT 30.7* 26.2*    Assessment/Plan: Status post Cesarean section. Stable   Continue current care. BPs were elevated with nl labs - will cont to observe D/C foley per protocol  Jayvier Burgher Y 05/29/2016, 12:36 PM

## 2016-05-30 LAB — RPR: RPR Ser Ql: NONREACTIVE

## 2016-05-30 NOTE — Progress Notes (Signed)
Visited with patient's mother and father and grandmother and other support people during [employer].  MOB shared what her journey has been like since Charleigh's birth and her sadness at leaving her tomorrow when she is discharged.  FOB stated that he just wants his baby home, but both parents are trying to accept that this is the best place for them.  Grandmother asked questions about how to support her children and how to connect with other families.  Family was very receptive to suggestions offered by NICU alums and staff.   MOB appreciated the support offered by chaplain, family support Orthoptistnetwork coordinator, other NICU parents, and NICU alums.    Please page as further needs arise.   05/30/16 1200  Clinical Encounter Type  Visited With Patient and family together  Visit Type Initial;Spiritual support

## 2016-05-30 NOTE — Progress Notes (Signed)
[place] PinkCharese Elizabeth Sussman [id number]  Subjective: Postpartum Day 2: Primary LTC/S due to fetal intolerance to labor, abruption Patient up ad lib, reports no syncope or dizziness. Feeding:  Pumping Contraceptive plan:  Undecided  Baby in NICU due to hypoglycemia--on supplemental tube feedings.  W/u of microcephaly continuing.  Objective: Temp:  [98 F (36.7 C)] 98 F (36.7 C) (07/06 2052) Pulse Rate:  [82-86] 82 (07/06 2052) Resp:  [18] 18 (07/06 2052) BP: (119-139)/(67-74) 139/74 mmHg (07/06 2052) SpO2:  [100 %] 100 % (07/06 0620)  CBC Latest Ref Rng 05/29/2016 05/28/2016 05/20/2016  WBC 4.0 - 10.5 K/uL 14.3(H) 8.7 7.8  Hemoglobin 12.0 - 15.0 g/dL 1.4(N8.8(L) 10.2(L) 9.9(L)  Hematocrit 36.0 - 46.0 % 26.2(L) 30.7(L) 29.7(L)  Platelets 150 - 400 K/uL 181 221 213     Physical Exam:  General: alert Lochia: appropriate Uterine Fundus: firm Abdomen:  + bowel sounds Incision: Honeycomb dressing dislodged on one edge, replaced, CDI. DVT Evaluation: No evidence of DVT seen on physical exam. Negative Homan's sign.   Assessment/Plan: Status post cesarean delivery, day 2--fetal intolerance to labor, abruption Anemia due to blood loss--hemodynamically stable Baby in NICU due to hypoglycemia Stable Continue current care. Plan for discharge tomorrow    Nigel BridgemanLATHAM, Jebidiah Baggerly MSN, CNM 05/30/2016, 10p

## 2016-05-30 NOTE — Progress Notes (Signed)
LCSW is acknowledging NICU admission and attempted to meet with MOB at the bedside. MOB is currently at the NICU luncheon and grandmother at the bedside feeding baby. LCSW introduced self to grandmother and explained services and role while in hospital.  Will attempt to meet with MOB at a later time to introduce self and services. Currently no concerns voiced, LCSW will follow for support.  Giuliano Preece LCSW, MSW Clinical Social Work: System Wide Float Coverage for [place] NICU Clinical social worker [phone]        

## 2016-05-30 NOTE — Clinical Documentation Improvement (Signed)
OB/GYN  Would you please clarify medical condition related to clinical findings listed below?   Acute Blood Loss Anemia, including the suspected or known cause or associated condition(s)  Acute on chronic blood loss anemia, including the suspected or known cause or associated condition(s)  Chronic blood loss anemia, including the suspected or known cause or associated condition(s)  Precipitous drop in Hematocrit, including the suspected or known cause or associated condition(s)  Other  Clinically Undetermined  Document any associated diagnoses/conditions.   Supporting Information: Had a 20% area of placental abruption.  Clots found.  Blood loss 600 cc.  Hemoglobin drop from 10.2 to 8.8.  Pt feeling lightheaded with standing.  Please exercise your independent, professional judgment when responding. A specific answer is not anticipated or expected.   Thank Modesta MessingYou,  Bianco Cange L [facility] Management [place] [phone]

## 2016-05-30 NOTE — Progress Notes (Signed)
Attempted to see patient 3 times today. Patient never in room. Also went to NICU, patient not there either. Will try to see later.

## 2016-05-30 NOTE — Lactation Note (Addendum)
This note was copied from a baby's chart. Lactation Consultation Note  Patient Name: Paula [name] Reason for consult: Follow-up assessment;NICU baby NICU baby 6341 hours old. Mom reports that she is trying to pump every 3 hours, but it is hard. Discussed thinking about the baby while pumping, and having pictures of the baby on her phone to look at while pumping. Enc pumping every 2-3 hours for 15 minutes followed by hand expression. Mom given NICU booklet with review and discussed taking even a drop of colostrum to NICU. Discussed transporting EBM back to hospital and mom aware of pumping rooms in NICU. Enc mom to offer lots of STS and nuzzling/latching at the breast. Enc mom to [facility] with [facility]atching as needed, and enc calling to set up an appointment time well ahead.   Mom states that her nipples are flat and she will need assistance with latching. Mom states that someone had mentioned possibly using NS. Discussed the need for an LC to see a latch and be fitted for NS. Mom given a hand pump with instructions, and discussed using prior to latching baby. Also discussed how pumping with DEBP can evert nipples as well.   Maternal Data    Feeding Feeding Type: Bottle Fed - Formula Nipple Type: Regular  LATCH Score/Interventions                      Lactation Tools Discussed/Used     Consult Status Consult Status: Follow-up Date: 05/31/16 Follow-up type: In-patient    Geralynn OchsWILLIARD, Kyana Aicher [name], 4:02 PM

## 2016-05-31 MED ORDER — OXYCODONE-ACETAMINOPHEN 5-325 MG PO TABS: 1.0000 | ORAL_TABLET | ORAL | Status: DC | PRN

## 2016-05-31 MED ORDER — IBUPROFEN 600 MG PO TABS: 600.0000 mg | ORAL_TABLET | Freq: Four times a day (QID) | ORAL | Status: DC | PRN

## 2016-05-31 NOTE — Lactation Note (Signed)
This note was copied from a baby's chart. Lactation Consultation Note  Patient Name: Paula [name] Reason for consult: Follow-up assessment;NICU baby;Infant < 6lbs    Follow up with mom of 59 hour old NICU infant. Infant is feeding per mom. Mom reports she has not pumped since yesterday. She say she is aware how to hand express and is not doing so at this time. Enc mom to pump every 2-3 hours for 15 minutes on Initiate setting to stimulate a milk supply. Mom has a manual pump to go home with and [facility] and asked her to call me if pump rental needed before d/c.   Mom reports she has put infant to breast once, she reports she feels her nipples are too big for the baby and was told she may need a nipple shield. Asked her to have baby's RN call for feeding assessment later today as mom was unsure of a good time to schedule it. Mom is concerned with BF infant with NG tube in place, told her it is ok to BF while tube is in place, mom voiced understanding.   Mom was pumping when I left the room. She reports she is feeling a little fuller today. Follow up later today and prn.   Maternal Data    Feeding Feeding Type: Formula Length of feed: 30 min  LATCH Score/Interventions                      Lactation Tools Discussed/Used WIC Program: Yes Pump Review: Setup, frequency, and cleaning   Consult Status Consult Status: Follow-up Date: 06/01/16 Follow-up type: In-patient    Silas FloodSharon S Trany Chernick [name], 9:08 AM

## 2016-05-31 NOTE — Discharge Summary (Signed)
  Cesarean Section Delivery Discharge Summary  Paula [name]  DOB:    05/31/1991 MRN:    [id number] CSN:    [id number]  Date of admission:                  05/28/2016  Date of discharge:                   05/31/2016   Procedures this admission:  Primary Cesarean section  Date of Delivery:  05/28/2016  Newborn Data:  Live born female  Birth Weight: 5 lb 1.1 oz (2300 g) APGAR: 6, 9  Home with mother.  History of Present Illness:  Paula [name] is a 25 y.o. female, G1P1001, who presents at 5599w6d weeks gestation. [facility]   Her pregnancy has been complicated by:  Patient Active Problem List   Diagnosis Date Noted  . Cesarean delivery delivered--primary, NRFHR, abruption 05/29/2016  . Active labor at term 05/28/2016   Hospital Course--Unscheduled Cesarean:  Admitted in labor, with possible SROM.  Due to fetal intolerance of labor she was consented for cesarean, with Dr. Estanislado Pandyivard performing a LTCS under epidural anesthesia with weight and Apgars as listed above. Infant was in good condition and remained at the patient's bedside.  The patient was taken to recovery in good condition.  Patient planned to breast feed.  On post-op day 1, patient was doing well, tolerating a regular diet.  Throughout her stay, her physical exam was WNL, her incision was CDI, and her vital signs remained stable.  By post-op day 3 , she was up ad lib, tolerating a regular diet, with good pain control with po med.  She was deemed to have received the full benefit of her hospital stay, and was discharged home in stable condition.  Contraceptive choice was unsure.   Feeding:  breast  Contraception:  no method  Hemoglobin Results:  CBC Latest Ref Rng 05/29/2016 05/28/2016 05/20/2016  WBC 4.0 - 10.5 K/uL 14.3(H) 8.7 7.8  Hemoglobin 12.0 - 15.0 g/dL 7.8(G8.8(L) 10.2(L) 9.9(L)  Hematocrit 36.0  - 46.0 % 26.2(L) 30.7(L) 29.7(L)  Platelets 150 - 400 K/uL 181 221 213   Prenatal labs: ABO, Rh: --/--/O POS (07/04 1345) Antibody: NEG (07/04 1345)negative Rubella: immune RPR: Non Reactive (11/24 0933)  HBsAg:   non reactive HIV: Non Reactive (11/24 0933)  GBS:   negative Tdap: received   Discharge Physical Exam:   General: alert, cooperative and no distress Lochia: appropriate Uterine Fundus: firm Abdomen:  + bowel sounds Incision: no significant drainage DVT Evaluation: No evidence of DVT seen on physical exam.  Intrapartum Procedures: cesarean: low cervical, transverse Postpartum Procedures: none Complications-Operative and Postpartum: none  Discharge Diagnoses: Term Pregnancy-delivered  Discharge Information:  Activity:           Pelvic rest Diet:                routine Medications: Ibuprofen, Colace, Iron and Percocet Condition:      stable Instructions:  Discharge to: home     [facility]UL[facility],Yaris Ferrell [facility] CNM 05/31/2016 7:57 AM

## 2016-06-01 ENCOUNTER — Ambulatory Visit: Payer: Self-pay

## 2016-06-01 NOTE — Lactation Note (Signed)
This note was copied from a baby's chart. Lactation Consultation Note  Telephone call from NICU RN on behalf of MOB inquiring [facility].  After a brief discussion through the RN MOB decided to wait until Monday.  Patient Name: Paula Barbarann EhlersCharese [name] UJWJX'BToday's Date: 06/01/2016     Maternal Data    Feeding    LATCH Score/Interventions                      Lactation Tools Discussed/Used     Consult Status      [name], [name] 06/01/2016, 4:06 PM

## 2016-06-03 ENCOUNTER — Inpatient Hospital Stay (HOSPITAL_COMMUNITY)
Admission: AD | Admit: 2016-06-03 | Discharge: 2016-06-07 | DRG: 193 | Disposition: A | Discharge status: Home / Self Care | Payer: Medicaid Other | Source: Ambulatory Visit | Attending: Obstetrics and Gynecology | Admitting: Obstetrics and Gynecology

## 2016-06-03 ENCOUNTER — Observation Stay (HOSPITAL_BASED_OUTPATIENT_CLINIC_OR_DEPARTMENT_OTHER): Payer: Medicaid Other

## 2016-06-03 ENCOUNTER — Encounter (HOSPITAL_COMMUNITY): Payer: Self-pay | Admitting: *Deleted

## 2016-06-03 ENCOUNTER — Inpatient Hospital Stay (HOSPITAL_COMMUNITY): Payer: Medicaid Other

## 2016-06-03 DIAGNOSIS — J9621 Acute and chronic respiratory failure with hypoxia: Secondary | ICD-10-CM | POA: Diagnosis not present

## 2016-06-03 DIAGNOSIS — J90 Pleural effusion, not elsewhere classified: Secondary | ICD-10-CM | POA: Diagnosis present

## 2016-06-03 DIAGNOSIS — O909 Complication of the puerperium, unspecified: Secondary | ICD-10-CM | POA: Diagnosis present

## 2016-06-03 DIAGNOSIS — R0602 Shortness of breath: Secondary | ICD-10-CM | POA: Diagnosis not present

## 2016-06-03 DIAGNOSIS — Z79899 Other long term (current) drug therapy: Secondary | ICD-10-CM

## 2016-06-03 DIAGNOSIS — I509 Heart failure, unspecified: Secondary | ICD-10-CM

## 2016-06-03 DIAGNOSIS — J9600 Acute respiratory failure, unspecified whether with hypoxia or hypercapnia: Secondary | ICD-10-CM | POA: Insufficient documentation

## 2016-06-03 DIAGNOSIS — Z6841 Body Mass Index (BMI) 40.0 and over, adult: Secondary | ICD-10-CM

## 2016-06-03 DIAGNOSIS — O9089 Other complications of the puerperium, not elsewhere classified: Secondary | ICD-10-CM | POA: Diagnosis not present

## 2016-06-03 DIAGNOSIS — E876 Hypokalemia: Secondary | ICD-10-CM | POA: Diagnosis present

## 2016-06-03 DIAGNOSIS — J189 Pneumonia, unspecified organism: Principal | ICD-10-CM | POA: Diagnosis present

## 2016-06-03 DIAGNOSIS — J9601 Acute respiratory failure with hypoxia: Secondary | ICD-10-CM | POA: Diagnosis present

## 2016-06-03 DIAGNOSIS — I10 Essential (primary) hypertension: Secondary | ICD-10-CM | POA: Diagnosis present

## 2016-06-03 DIAGNOSIS — O34211 Maternal care for low transverse scar from previous cesarean delivery: Secondary | ICD-10-CM | POA: Diagnosis present

## 2016-06-03 LAB — URINALYSIS, ROUTINE W REFLEX MICROSCOPIC
Bilirubin Urine: NEGATIVE
Glucose, UA: NEGATIVE mg/dL
Ketones, ur: NEGATIVE mg/dL
Leukocytes, UA: NEGATIVE
Nitrite: NEGATIVE
Protein, ur: 100 mg/dL — AB
Specific Gravity, Urine: 1.025 (ref 1.005–1.030)
pH: 6 (ref 5.0–8.0)

## 2016-06-03 LAB — COMPREHENSIVE METABOLIC PANEL
ALT: 43 U/L (ref 14–54)
ALT: 55 U/L — ABNORMAL HIGH (ref 14–54)
AST: 34 U/L (ref 15–41)
AST: 43 U/L — ABNORMAL HIGH (ref 15–41)
Albumin: 2.5 g/dL — ABNORMAL LOW (ref 3.5–5.0)
Albumin: 2.6 g/dL — ABNORMAL LOW (ref 3.5–5.0)
Alkaline Phosphatase: 95 U/L (ref 38–126)
Alkaline Phosphatase: 99 U/L (ref 38–126)
Anion gap: 7 (ref 5–15)
Anion gap: 7 (ref 5–15)
BUN: 13 mg/dL (ref 6–20)
BUN: 10 mg/dL (ref 6–20)
CO2: 24 mmol/L (ref 22–32)
CO2: 27 mmol/L (ref 22–32)
Calcium: 8.4 mg/dL — ABNORMAL LOW (ref 8.9–10.3)
Calcium: 8.7 mg/dL — ABNORMAL LOW (ref 8.9–10.3)
Chloride: 107 mmol/L (ref 101–111)
Chloride: 104 mmol/L (ref 101–111)
Creatinine, Ser: 0.81 mg/dL (ref 0.44–1.00)
Creatinine, Ser: 0.81 mg/dL (ref 0.44–1.00)
GFR calc Af Amer: >60 mL/min (ref >60)
GFR calc Af Amer: >60 mL/min (ref >60)
GFR calc non Af Amer: >60 mL/min (ref >60)
GFR calc non Af Amer: >60 mL/min (ref >60)
Glucose, Bld: 88 mg/dL (ref 65–99)
Glucose, Bld: 90 mg/dL (ref 65–99)
Potassium: 3.3 mmol/L — ABNORMAL LOW (ref 3.5–5.1)
Potassium: 3.6 mmol/L (ref 3.5–5.1)
Sodium: 138 mmol/L (ref 135–145)
Sodium: 138 mmol/L (ref 135–145)
Total Bilirubin: 0.3 mg/dL (ref 0.3–1.2)
Total Bilirubin: 0.3 mg/dL (ref 0.3–1.2)
Total Protein: 6.3 g/dL — ABNORMAL LOW (ref 6.5–8.1)
Total Protein: 6.1 g/dL — ABNORMAL LOW (ref 6.5–8.1)

## 2016-06-03 LAB — URINE MICROSCOPIC-ADD ON

## 2016-06-03 LAB — CBC
HCT: 24.7 % — ABNORMAL LOW (ref 36.0–46.0)
HCT: 27.6 % — ABNORMAL LOW (ref 36.0–46.0)
Hemoglobin: 8.2 g/dL — ABNORMAL LOW (ref 12.0–15.0)
Hemoglobin: 9 g/dL — ABNORMAL LOW (ref 12.0–15.0)
MCH: 30 pg (ref 26.0–34.0)
MCH: 29.5 pg (ref 26.0–34.0)
MCHC: 33.2 g/dL (ref 30.0–36.0)
MCHC: 32.6 g/dL (ref 30.0–36.0)
MCV: 90.5 fL (ref 78.0–100.0)
MCV: 90.5 fL (ref 78.0–100.0)
Platelets: 234 10*3/uL (ref 150–400)
Platelets: 269 10*3/uL (ref 150–400)
RBC: 2.73 MIL/uL — ABNORMAL LOW (ref 3.87–5.11)
RBC: 3.05 MIL/uL — ABNORMAL LOW (ref 3.87–5.11)
RDW: 14.7 % (ref 11.5–15.5)
RDW: 14.6 % (ref 11.5–15.5)
WBC: 8.2 10*3/uL (ref 4.0–10.5)
WBC: 8.5 10*3/uL (ref 4.0–10.5)

## 2016-06-03 LAB — TROPONIN I: Troponin I: 0.08 ng/mL (ref <0.03)

## 2016-06-03 LAB — PROCALCITONIN: Procalcitonin: <0.1 ng/mL

## 2016-06-03 LAB — LACTATE DEHYDROGENASE
LDH: 227 U/L — ABNORMAL HIGH (ref 98–192)
LDH: 274 U/L — ABNORMAL HIGH (ref 98–192)

## 2016-06-03 LAB — PROTEIN / CREATININE RATIO, URINE
Creatinine, Urine: 314 mg/dL
Creatinine, Urine: 310 mg/dL
Protein Creatinine Ratio: 0.35 mg/mg{Cre} — ABNORMAL HIGH (ref 0.00–0.15)
Protein Creatinine Ratio: 0.27 mg/mg{Cre} — ABNORMAL HIGH (ref 0.00–0.15)
Total Protein, Urine: 111 mg/dL
Total Protein, Urine: 83 mg/dL

## 2016-06-03 LAB — BRAIN NATRIURETIC PEPTIDE: B Natriuretic Peptide: 201.4 pg/mL — ABNORMAL HIGH (ref 0.0–100.0)

## 2016-06-03 LAB — URIC ACID: Uric Acid, Serum: 6.5 mg/dL (ref 2.3–6.6)

## 2016-06-03 MED ORDER — POTASSIUM CHLORIDE CRYS ER 20 MEQ PO TBCR
20.0000 meq | EXTENDED_RELEASE_TABLET | Freq: Two times a day (BID) | ORAL | Status: DC
  Administered 2016-06-03: 20 meq via ORAL
  Filled 2016-06-03 (×3): qty 1

## 2016-06-03 MED ORDER — POTASSIUM CHLORIDE CRYS ER 20 MEQ PO TBCR
20.0000 meq | EXTENDED_RELEASE_TABLET | Freq: Every day | ORAL | Status: DC
  Administered 2016-06-04 – 2016-06-07 (×4): 20 meq via ORAL
  Filled 2016-06-03 (×6): qty 1

## 2016-06-03 MED ORDER — IOPAMIDOL (ISOVUE-370) INJECTION 76%
100.0000 mL | Freq: Once | INTRAVENOUS | Status: AC | PRN
  Administered 2016-06-03: 100 mL via INTRAVENOUS

## 2016-06-03 MED ORDER — OXYCODONE-ACETAMINOPHEN 5-325 MG PO TABS
1.0000 | ORAL_TABLET | Freq: Four times a day (QID) | ORAL | Status: DC | PRN
  Administered 2016-06-03 – 2016-06-04 (×4): 1 via ORAL
  Filled 2016-06-03 (×5): qty 1

## 2016-06-03 MED ORDER — DEXTROSE 5 % IV SOLN
2.0000 g | INTRAVENOUS | Status: DC
  Administered 2016-06-03 – 2016-06-06 (×4): 2 g via INTRAVENOUS
  Filled 2016-06-03 (×5): qty 2

## 2016-06-03 MED ORDER — ACETAMINOPHEN 325 MG PO TABS
650.0000 mg | ORAL_TABLET | Freq: Four times a day (QID) | ORAL | Status: DC | PRN
  Administered 2016-06-03 – 2016-06-05 (×3): 650 mg via ORAL
  Filled 2016-06-03 (×3): qty 2

## 2016-06-03 MED ORDER — IBUPROFEN 600 MG PO TABS: 600.0000 mg | ORAL_TABLET | Freq: Four times a day (QID) | ORAL | Status: DC | PRN

## 2016-06-03 MED ORDER — FUROSEMIDE 10 MG/ML IJ SOLN
30.0000 mg | Freq: Once | INTRAMUSCULAR | Status: AC
  Administered 2016-06-03: 30 mg via INTRAVENOUS
  Filled 2016-06-03: qty 4

## 2016-06-03 MED ORDER — FUROSEMIDE 10 MG/ML IJ SOLN
10.0000 mg | Freq: Once | INTRAMUSCULAR | Status: AC
Start: 2016-06-03 — End: 2016-06-03
  Administered 2016-06-03: 10 mg via INTRAVENOUS
  Filled 2016-06-03: qty 2

## 2016-06-03 MED ORDER — DEXTROSE 5 % IV SOLN
500.0000 mg | INTRAVENOUS | Status: DC
  Administered 2016-06-03 – 2016-06-06 (×4): 500 mg via INTRAVENOUS
  Filled 2016-06-03 (×6): qty 500

## 2016-06-03 MED ORDER — DEXTROSE 5 % IV SOLN
1.0000 g | INTRAVENOUS | Status: DC
  Filled 2016-06-03: qty 10

## 2016-06-03 NOTE — Consult Note (Signed)
Name: Paula [name] MRN: [id number] DOB: [birth year]    ADMISSION DATE:  06/03/2016 CONSULTATION DATE:  06/03/16  REFERRING MD :  Dr. Su Hilt   CHIEF COMPLAINT:  Shortness of Breath   HISTORY OF PRESENT ILLNESS:  25 y/o F, never smoker, with PMH of morbid obesity (BMI 41), prior abnormal PAP, + HPV & recent (7/4) partial placental abruption resulting in cesarean section who presented to [facility] on [facility]10 with shortness of breath and headache.    Upon initial assessment, she was noted to have room air saturations of 80%.  She was placed on 8L O2 with improvement to 97%. Exam notes also reflect 2+ edema.  Given symptoms, CTA of the chest assessed on presentation which was negative for PE but demonstrated small to moderate R>L layering pleural effusions with widespread bilateral abnormal pulmonary opacity most pronounced in the RUL.  The patient was treated with empiric antibiotics - rocephin / azithromycin and 10 mg IV lasix.    Currently, she reports she began swelling after she was discharged home.  She has noted increased dyspnea since discharge.  She had fever prior to delivery but none since.  She also endorses several episodes of vomiting after delivery.  She developed a dry, non-productive cough on day of presentation.  Denies chest pain but endorses feeling as if she can not take a deep breath and feels more comfortable upright.  She denies syncope, pre-syncope, palpitations, abd pain.  Reports incision clean / dry.    PCCM consulted for evaluation of dyspnea and abnormal CT.    PAST MEDICAL HISTORY :   has a past medical history of Medical history non-contributory.  has past surgical history that includes Wisdom tooth extraction and Cesarean section (N/A, 05/28/2016).   Prior to Admission medications   Medication Sig Start Date End Date Taking? Authorizing Provider  ferrous sulfate 325 (65 FE) MG tablet Take 325 mg by mouth daily with breakfast.   Yes Historical  Provider, MD  ibuprofen (ADVIL,MOTRIN) 600 MG tablet Take 1 tablet (600 mg total) by mouth every 6 (six) hours as needed for mild pain. 05/31/16  Yes Hoover Browns, MD  oxyCODONE-acetaminophen (PERCOCET/ROXICET) 5-325 MG tablet Take 1 tablet by mouth every 4 (four) hours as needed (pain scale 4-7). 05/31/16  Yes Hoover Browns, MD  Prenatal Vit-Fe Fumarate-FA (PRENATAL MULTIVITAMIN) TABS tablet Take 1 tablet by mouth daily at 12 noon.   Yes Historical Provider, MD   No Known Allergies  FAMILY HISTORY:  family history is not on file.   SOCIAL HISTORY:  reports that she has never smoked. She has never used smokeless tobacco. She reports that she does not drink alcohol or use illicit drugs.  REVIEW OF SYSTEMS:  POSITIVES IN BOLD Constitutional: Negative for fever, chills, weight loss, malaise/fatigue and diaphoresis.  HENT: Negative for hearing loss, ear pain, nosebleeds, congestion, sore throat, neck pain, tinnitus and ear discharge.   Eyes: Negative for blurred vision, double vision, photophobia, pain, discharge and redness.  Respiratory: Negative for cough, hemoptysis, sputum production, shortness of breath, wheezing and stridor.   Cardiovascular: Negative for chest pain, palpitations, orthopnea, claudication, leg swelling and PND.  Gastrointestinal: Negative for heartburn, nausea, vomiting, abdominal pain, diarrhea, constipation, blood in stool and melena.  Genitourinary: Negative for dysuria, urgency, frequency, hematuria and flank pain.  Musculoskeletal: Negative for myalgias, back pain, joint pain and falls.  Skin: Negative for itching and rash.  Neurological: Negative for dizziness, tingling, tremors, sensory change, speech change, focal weakness, seizures,  loss of consciousness, weakness and headaches.  Endo/Heme/Allergies: Negative for environmental allergies and polydipsia. Does not bruise/bleed easily.  SUBJECTIVE:   VITAL SIGNS: Temp:  [98.3 F (36.8 C)-98.4 F (36.9 C)] 98.4 F (36.9 C)  (07/10 1238) Pulse Rate:  [76-98] 85 (07/10 1347) Resp:  [16-20] 16 (07/10 1345) BP: (130-142)/(68-96) 140/89 mmHg (07/10 1345) SpO2:  [80 %-100 %] 100 % (07/10 1130) Weight:  [254 lb (115.214 kg)] 254 lb (115.214 kg) (07/10 1300)  PHYSICAL EXAMINATION: General:  Obese, young adult female in NAD Neuro:  AAOx4, speech clear, MAE  HEENT:  MM pink/moist, unable to appreciate JVD Cardiovascular:  s1s2 rrr, no m/r/g Lungs:  Mild tachypnea without distress, able to speak full sentences, lungs diminished posteriorly, good air movement anteriorly Abdomen:  Obese/soft, bsx4 active, non-tender  Musculoskeletal:  No acute deformities  Skin:  Warm/dry, 2-3+ pitting BLE edema    Recent Labs Lab 05/28/16 1345 06/03/16 0926  NA 135 138  K 3.8 3.3*  CL 106 107  CO2 22 24  BUN 6 13  CREATININE 0.57 0.81  GLUCOSE 71 88    Recent Labs Lab 05/28/16 1345 05/29/16 0503 06/03/16 0926  HGB 10.2* 8.8* 8.2*  HCT 30.7* 26.2* 24.7*  WBC 8.7 14.3* 8.2  PLT 221 181 234   Ct Angio Chest Pe W/cm &/or Wo Cm  06/03/2016  CLINICAL DATA:  25 year old postpartum female, recently status post C-section, with shortness of breath and cough. Initial encounter. EXAM: CT ANGIOGRAPHY CHEST WITH CONTRAST TECHNIQUE: Multidetector CT imaging of the chest was performed using the standard protocol during bolus administration of intravenous contrast. Multiplanar CT image reconstructions and MIPs were obtained to evaluate the vascular anatomy. CONTRAST:  100 mL Isovue 370. COMPARISON:  None. FINDINGS: Adequate contrast bolus timing in the pulmonary arterial tree. Respiratory and/or cardiac motion artifact throughout the study, mild. No focal filling defect identified in the pulmonary arteries to suggest the acute pulmonary embolism. Moderate layering right and small layering left pleural effusions. No pericardial effusion. Major airways are patent. There is confluent abnormal right upper lobe pulmonary opacity with early  perihilar air bronchograms. There is similarly involvement in the superior aspect of the right middle lobe. There is mild left upper lobe peribronchial mostly ground-glass opacity. There is bilateral lower lobe peribronchial opacity, which is more confluent in both costophrenic angles. No thoracic lymphadenopathy. Negative thoracic inlet. Negative visualized upper abdominal viscera including the liver, spleen, pancreas, and stomach. No osseous abnormality identified. Review of the MIP images confirms the above findings. IMPRESSION: 1. Positive for small to moderate right greater than left layering pleural effusions plus widespread bilateral abnormal pulmonary opacity most pronounced in the right upper lobe. Top differential considerations in this setting include aspiration or pneumonia, but consider also asymmetric noncardiogenic pulmonary edema, and amniotic fluid embolism. 2. Negative for acute thrombotic pulmonary embolism. Electronically Signed   By: Odessa Fleming M.D.   On: 06/03/2016 11:29     SIGNIFICANT EVENTS  7/4-7/7  Admit with partial placental abruption, C-Secection  7/10  Admit with SOB, headache, increased swelling since delivery.  CTA chest negative for PE, R>L opacities, bilateral effusions  STUDIES:  ECHO 7/10 >>   ANTIBIOTICS:  Rocephin 7/10 >>  Azithromycin 7/10 >>   ASSESSMENT / PLAN:  1.  Acute Hypoxic Respiratory Failure with R>L Pulmonary Opacities - ddx includes PNA, pulmonary edema secondary to volume overload (unable to review I/O's but baseline Hgb dropped during admit, ? Dilutional vs ABL), amniotic fluid embolism with secondary lung  injury, and aspiration pneumonitis.    2.  Bilateral Pleural Effusions - R>L  3.  Hypokalemia  4.  At Risk AKI - in setting of contrast dye, diuretics   Plan: Assess PCT to limit abx exposure Wean O2 to support sats > 94% Follow up CXR in am to review effusions / airspace disease If effusions enlarged or unchanged, may need to consider  sampling Additional 30 mg IV lasix (40mg  total 7/10) Assess ECHO to rule out cardiac source of edema  Pulmonary hygiene - IS, mobilize  SCD's for DVT prophylaxis  Monitor in ICU for now.  Hemodynamically stable without respiratory distress.    Canary BrimBrandi Paula Harbison, NP-C [facility] Pgr: [phone] or if no answer [phone] 06/03/2016, 2:11 PM

## 2016-06-03 NOTE — H&P (Addendum)
Paula [name] is an 25 y.o. female. Pt presents c/o SOB and HA.  She was found to have O2 sat of 80% upon presentation and was given 8l of oxygen in order to bring it to 97%.  CT scan was ordered.  Pertinent Gynecological History:  non-contributory  Past Medical History  Diagnosis Date  . Medical history non-contributory     Past Surgical History  Procedure Laterality Date  . Wisdom tooth extraction    . Cesarean section N/A 05/28/2016    Procedure: CESAREAN SECTION;  Surgeon: Silverio Lay, MD;  Location: [facility] BIRTHING SUITES;  Service: Obstetrics;  Laterality: N/A;    History reviewed. No pertinent family history.  Social History:  reports that she has never smoked. She has never used smokeless tobacco. She reports that she does not drink alcohol or use illicit drugs.  Allergies: No Known Allergies  Prescriptions prior to admission  Medication Sig Dispense Refill Last Dose  . ferrous sulfate 325 (65 FE) MG tablet Take 325 mg by mouth daily with breakfast.   06/02/2016 at Unknown time  . ibuprofen (ADVIL,MOTRIN) 600 MG tablet Take 1 tablet (600 mg total) by mouth every 6 (six) hours as needed for mild pain. 30 tablet 0 06/03/2016 at Unknown time  . oxyCODONE-acetaminophen (PERCOCET/ROXICET) 5-325 MG tablet Take 1 tablet by mouth every 4 (four) hours as needed (pain scale 4-7). 30 tablet 0 06/03/2016 at Unknown time  . Prenatal Vit-Fe Fumarate-FA (PRENATAL MULTIVITAMIN) TABS tablet Take 1 tablet by mouth daily at 12 noon.   06/03/2016 at Unknown time    ROS Non-contributory  Blood pressure 130/68, pulse 76, temperature 98.4 F (36.9 C), temperature source Oral, resp. rate 16, height  (1.626 m), weight 254 lb (115.214 kg), SpO2 100 %, unknown if currently breastfeeding. Physical Exam  Lungs decreased breath sounds from mid rt to lower rt back CV RRR Abd soft, NABS, app tender, uterus firm Ext no calf tenderness and 2+ edema, 1-2+ Minimal to no vaginal  bleeding  Results for orders placed or performed during the hospital encounter of 06/03/16 (from the past 24 hour(s))  Urinalysis, Routine w reflex microscopic (not at [facility])     Status: Abnormal   Collection Time: 06/03/16  8:44 AM  Result Value Ref Range   Color, Urine YELLOW YELLOW   APPearance CLEAR CLEAR   Specific Gravity, Urine 1.025 1.005 - 1.030   pH 6.0 5.0 - 8.0   Glucose, UA NEGATIVE NEGATIVE mg/dL   Hgb urine dipstick LARGE (A) NEGATIVE   Bilirubin Urine NEGATIVE NEGATIVE   Ketones, ur NEGATIVE NEGATIVE mg/dL   Protein, ur 147 (A) NEGATIVE mg/dL   Nitrite NEGATIVE NEGATIVE   Leukocytes, UA NEGATIVE NEGATIVE  Urine microscopic-add on     Status: Abnormal   Collection Time: 06/03/16  8:44 AM  Result Value Ref Range   Squamous Epithelial / LPF 0-5 (A) NONE SEEN   WBC, UA 0-5 0 - 5 WBC/hpf   RBC / HPF 0-5 0 - 5 RBC/hpf   Bacteria, UA RARE (A) NONE SEEN  Protein / creatinine ratio, urine     Status: Abnormal   Collection Time: 06/03/16  8:56 AM  Result Value Ref Range   Creatinine, Urine 314.00 mg/dL   Total Protein, Urine 111 mg/dL   Protein Creatinine Ratio 0.35 (H) 0.00 - 0.15 mg/mg[Cre]  CBC     Status: Abnormal   Collection Time: 06/03/16  9:26 AM  Result Value Ref Range   WBC 8.2 4.0 -  10.5 K/uL   RBC 2.73 (L) 3.87 - 5.11 MIL/uL   Hemoglobin 8.2 (L) 12.0 - 15.0 g/dL   HCT 09.8 (L) 11.9 - 14.7 %   MCV 90.5 78.0 - 100.0 fL   MCH 30.0 26.0 - 34.0 pg   MCHC 33.2 30.0 - 36.0 g/dL   RDW 82.9 56.2 - 13.0 %   Platelets 234 150 - 400 K/uL  Comprehensive metabolic panel     Status: Abnormal   Collection Time: 06/03/16  9:26 AM  Result Value Ref Range   Sodium 138 135 - 145 mmol/L   Potassium 3.3 (L) 3.5 - 5.1 mmol/L   Chloride 107 101 - 111 mmol/L   CO2 24 22 - 32 mmol/L   Glucose, Bld 88 65 - 99 mg/dL   BUN 13 6 - 20 mg/dL   Creatinine, Ser 8.65 0.44 - 1.00 mg/dL   Calcium 8.4 (L) 8.9 - 10.3 mg/dL   Total Protein 6.3 (L) 6.5 - 8.1 g/dL   Albumin 2.5 (L) 3.5 -  5.0 g/dL   AST 34 15 - 41 U/L   ALT 43 14 - 54 U/L   Alkaline Phosphatase 95 38 - 126 U/L   Total Bilirubin 0.3 0.3 - 1.2 mg/dL   GFR calc non Af Amer >60 >60 mL/min   GFR calc Af Amer >60 >60 mL/min   Anion gap 7 5 - 15  Lactate dehydrogenase     Status: Abnormal   Collection Time: 06/03/16  9:26 AM  Result Value Ref Range   LDH 227 (H) 98 - 192 U/L  Uric acid     Status: None   Collection Time: 06/03/16  9:26 AM  Result Value Ref Range   Uric Acid, Serum 6.5 2.3 - 6.6 mg/dL  Protein / creatinine ratio, urine     Status: Abnormal   Collection Time: 06/03/16 10:05 AM  Result Value Ref Range   Creatinine, Urine 310.00 mg/dL   Total Protein, Urine 83 mg/dL   Protein Creatinine Ratio 0.27 (H) 0.00 - 0.15 mg/mg[Cre]    Ct Angio Chest Pe W/cm &/or Wo Cm  06/03/2016  CLINICAL DATA:  25 year old postpartum female, recently status post C-section, with shortness of breath and cough. Initial encounter. EXAM: CT ANGIOGRAPHY CHEST WITH CONTRAST TECHNIQUE: Multidetector CT imaging of the chest was performed using the standard protocol during bolus administration of intravenous contrast. Multiplanar CT image reconstructions and MIPs were obtained to evaluate the vascular anatomy. CONTRAST:  100 mL Isovue 370. COMPARISON:  None. FINDINGS: Adequate contrast bolus timing in the pulmonary arterial tree. Respiratory and/or cardiac motion artifact throughout the study, mild. No focal filling defect identified in the pulmonary arteries to suggest the acute pulmonary embolism. Moderate layering right and small layering left pleural effusions. No pericardial effusion. Major airways are patent. There is confluent abnormal right upper lobe pulmonary opacity with early perihilar air bronchograms. There is similarly involvement in the superior aspect of the right middle lobe. There is mild left upper lobe peribronchial mostly ground-glass opacity. There is bilateral lower lobe peribronchial opacity, which is more  confluent in both costophrenic angles. No thoracic lymphadenopathy. Negative thoracic inlet. Negative visualized upper abdominal viscera including the liver, spleen, pancreas, and stomach. No osseous abnormality identified. Review of the MIP images confirms the above findings. IMPRESSION: 1. Positive for small to moderate right greater than left layering pleural effusions plus widespread bilateral abnormal pulmonary opacity most pronounced in the right upper lobe. Top differential considerations in this setting include aspiration  or pneumonia, but consider also asymmetric noncardiogenic pulmonary edema, and amniotic fluid embolism. 2. Negative for acute thrombotic pulmonary embolism. Electronically Signed   By: Odessa FlemingH  Hall M.D.   On: 06/03/2016 11:29    Assessment/Plan: S/p C/S on 7/4 for partial placental abruption presenting with pleural effusions and possible pneumonia.  Pt started on ceftriaxone and zithromax and given dose of lasix.  The CT scan added AFE as possibility in differential so I contacted Pulmonary for consult to see if there was anything additional they would add.  I spoke with Dr. Quitman Livingsobert Bynum and he felt supportive care with the addition of the antibiotics was a good course of treatment and would come see her as well.  I appreciate any additional input he may have.  She does not have preeclampsia.  BPs are controlled and urine PCR was 0.27.  Her hgb is slightly lower compared to discharge at 8.2 (from 8.8) and LDH mildly elevated at 227.  Will cont to observe.  Will replete potassium with po K-dur.  Jameriah Trotti Y 06/03/2016, 1:11 PM

## 2016-06-03 NOTE — MAU Provider Note (Signed)
History     CSN: [id number]  Arrival date and time: 06/03/16 8469   First Provider Initiated Contact with Patient 06/03/16 [id number]      Chief Complaint  Patient presents with  . Headache  . Shortness of Breath  . Leg Swelling   HPI Paula [name] is a 25 y.o. G1P1001 who presents 6 days s/p PCS with complaints of headache, SOB, & BLE swelling. Was discharged from hospital on Friday & reports symptoms have worsened since discharge. Had c/section for fetal intolerance to labor.  Constant headache since Friday. Has been taken percocet which is helping for incisional pain but not for her headache. Rates pain 8/10. Denies vision changes or epigastric pain. Endorses bilateral leg swelling since the end of her pregnancy that she feels has worsened since delivery. States the swelling is painful to touch and walk; denies calf pain.  Shortness of breath since time of discharge that has worsened, associated with intermittent non-productive cough. Denies chest pain. States it is difficult to take deep breaths. Denies fever.  OB History    Gravida Para Term Preterm AB TAB SAB Ectopic Multiple Living   0 1      Past Medical History  Diagnosis Date  . Medical history non-contributory     Past Surgical History  Procedure Laterality Date  . Wisdom tooth extraction    . Cesarean section N/A 05/28/2016    Procedure: CESAREAN SECTION;  Surgeon: Silverio Lay, MD;  Location: [facility];  Service: Obstetrics;  Laterality: N/A;    History reviewed. No pertinent family history.  Social History  Substance Use Topics  . Smoking status: Never Smoker   . Smokeless tobacco: Never Used  . Alcohol Use: No    Allergies: No Known Allergies  Prescriptions prior to admission  Medication Sig Dispense Refill Last Dose  . ferrous sulfate 325 (65 FE) MG tablet Take 325 mg by mouth daily with breakfast.   Past Week at Unknown time  . ibuprofen (ADVIL,MOTRIN) 600 MG tablet Take 1  tablet (600 mg total) by mouth every 6 (six) hours as needed for mild pain. 30 tablet 0   . oxyCODONE-acetaminophen (PERCOCET/ROXICET) 5-325 MG tablet Take 1 tablet by mouth every 4 (four) hours as needed (pain scale 4-7). 30 tablet 0   . Prenatal Vit-Fe Fumarate-FA (PRENATAL MULTIVITAMIN) TABS tablet Take 1 tablet by mouth daily at 12 noon.   05/28/2016 at Unknown time    Review of Systems  Constitutional: Negative for fever and chills.  Eyes: Negative for blurred vision.  Respiratory: Positive for cough and shortness of breath. Negative for sputum production and wheezing.   Cardiovascular: Positive for leg swelling. Negative for chest pain and palpitations.  Gastrointestinal: Positive for abdominal pain (incisional pain being managed with percocet). Negative for nausea and vomiting.  Neurological: Positive for headaches. Negative for dizziness.   Physical Exam   Blood pressure 138/87, pulse 85, temperature 98.3 F (36.8 C), temperature source Oral, resp. rate 18, SpO2 100 %, unknown if currently breastfeeding.  Temp:  [98.3 F (36.8 C)-98.4 F (36.9 C)] 98.4 F (36.9 C) (07/10 1238) Pulse Rate:  [76-98] 85 (07/10 1347) Resp:  [16-20] 16 (07/10 1345) BP: (130-142)/(68-96) 140/89 mmHg (07/10 1345) SpO2:  [80 %-100 %] 100 % (07/10 1130) Weight:  [254 lb (115.214 kg)] 254 lb (115.214 kg) (07/10 1300)   Physical Exam  Nursing note and vitals reviewed. Constitutional: She is oriented to person, place, and time.  She appears well-developed and well-nourished. No distress.  HENT:  Head: Normocephalic and atraumatic.  Eyes: Conjunctivae are normal. Right eye exhibits no discharge. Left eye exhibits no discharge. No scleral icterus.  Neck: Normal range of motion.  Cardiovascular: Normal rate, regular rhythm and normal heart sounds.   No murmur heard. Respiratory: Effort normal. Tachypnea noted. No respiratory distress. She has decreased breath sounds in the right lower field. She has no  wheezes.  Musculoskeletal: She exhibits edema (BLE, 2+).  Neurological: She is alert and oriented to person, place, and time.  Skin: Skin is warm and dry. She is not diaphoretic.  Psychiatric: She has a normal mood and affect. Her behavior is normal. Judgment and thought content normal.    MAU Course  Procedures Results for orders placed or performed during the hospital encounter of 06/03/16 (from the past 24 hour(s))  Urinalysis, Routine w reflex microscopic (not at [facility])     Status: Abnormal   Collection Time: 06/03/16  8:44 AM  Result Value Ref Range   Color, Urine YELLOW YELLOW   APPearance CLEAR CLEAR   Specific Gravity, Urine 1.025 1.005 - 1.030   pH 6.0 5.0 - 8.0   Glucose, UA NEGATIVE NEGATIVE mg/dL   Hgb urine dipstick LARGE (A) NEGATIVE   Bilirubin Urine NEGATIVE NEGATIVE   Ketones, ur NEGATIVE NEGATIVE mg/dL   Protein, ur 161 (A) NEGATIVE mg/dL   Nitrite NEGATIVE NEGATIVE   Leukocytes, UA NEGATIVE NEGATIVE  Urine microscopic-add on     Status: Abnormal   Collection Time: 06/03/16  8:44 AM  Result Value Ref Range   Squamous Epithelial / LPF 0-5 (A) NONE SEEN   WBC, UA 0-5 0 - 5 WBC/hpf   RBC / HPF 0-5 0 - 5 RBC/hpf   Bacteria, UA RARE (A) NONE SEEN  Protein / creatinine ratio, urine     Status: Abnormal   Collection Time: 06/03/16  8:56 AM  Result Value Ref Range   Creatinine, Urine 314.00 mg/dL   Total Protein, Urine 111 mg/dL   Protein Creatinine Ratio 0.35 (H) 0.00 - 0.15 mg/mg[Cre]  CBC     Status: Abnormal   Collection Time: 06/03/16  9:26 AM  Result Value Ref Range   WBC 8.2 4.0 - 10.5 K/uL   RBC 2.73 (L) 3.87 - 5.11 MIL/uL   Hemoglobin 8.2 (L) 12.0 - 15.0 g/dL   HCT 09.6 (L) 04.5 - 40.9 %   MCV 90.5 78.0 - 100.0 fL   MCH 30.0 26.0 - 34.0 pg   MCHC 33.2 30.0 - 36.0 g/dL   RDW 81.1 91.4 - 78.2 %   Platelets 234 150 - 400 K/uL  Comprehensive metabolic panel     Status: Abnormal   Collection Time: 06/03/16  9:26 AM  Result Value Ref Range   Sodium 138  135 - 145 mmol/L   Potassium 3.3 (L) 3.5 - 5.1 mmol/L   Chloride 107 101 - 111 mmol/L   CO2 24 22 - 32 mmol/L   Glucose, Bld 88 65 - 99 mg/dL   BUN 13 6 - 20 mg/dL   Creatinine, Ser 9.56 0.44 - 1.00 mg/dL   Calcium 8.4 (L) 8.9 - 10.3 mg/dL   Total Protein 6.3 (L) 6.5 - 8.1 g/dL   Albumin 2.5 (L) 3.5 - 5.0 g/dL   AST 34 15 - 41 U/L   ALT 43 14 - 54 U/L   Alkaline Phosphatase 95 38 - 126 U/L   Total Bilirubin 0.3 0.3 - 1.2 mg/dL  GFR calc non Af Amer >60 >60 mL/min   GFR calc Af Amer >60 >60 mL/min   Anion gap 7 5 - 15  Lactate dehydrogenase     Status: Abnormal   Collection Time: 06/03/16  9:26 AM  Result Value Ref Range   LDH 227 (H) 98 - 192 U/L  Uric acid     Status: None   Collection Time: 06/03/16  9:26 AM  Result Value Ref Range   Uric Acid, Serum 6.5 2.3 - 6.6 mg/dL  Protein / creatinine ratio, urine     Status: Abnormal   Collection Time: 06/03/16 10:05 AM  Result Value Ref Range   Creatinine, Urine 310.00 mg/dL   Total Protein, Urine 83 mg/dL   Protein Creatinine Ratio 0.27 (H) 0.00 - 0.15 mg/mg[Cre]   Ct Angio Chest Pe W/cm &/or Wo Cm  06/03/2016  CLINICAL DATA:  25 year old postpartum female, recently status post C-section, with shortness of breath and cough. Initial encounter. EXAM: CT ANGIOGRAPHY CHEST WITH CONTRAST TECHNIQUE: Multidetector CT imaging of the chest was performed using the standard protocol during bolus administration of intravenous contrast. Multiplanar CT image reconstructions and MIPs were obtained to evaluate the vascular anatomy. CONTRAST:  100 mL Isovue 370. COMPARISON:  None. FINDINGS: Adequate contrast bolus timing in the pulmonary arterial tree. Respiratory and/or cardiac motion artifact throughout the study, mild. No focal filling defect identified in the pulmonary arteries to suggest the acute pulmonary embolism. Moderate layering right and small layering left pleural effusions. No pericardial effusion. Major airways are patent. There is  confluent abnormal right upper lobe pulmonary opacity with early perihilar air bronchograms. There is similarly involvement in the superior aspect of the right middle lobe. There is mild left upper lobe peribronchial mostly ground-glass opacity. There is bilateral lower lobe peribronchial opacity, which is more confluent in both costophrenic angles. No thoracic lymphadenopathy. Negative thoracic inlet. Negative visualized upper abdominal viscera including the liver, spleen, pancreas, and stomach. No osseous abnormality identified. Review of the MIP images confirms the above findings. IMPRESSION: 1. Positive for small to moderate right greater than left layering pleural effusions plus widespread bilateral abnormal pulmonary opacity most pronounced in the right upper lobe. Top differential considerations in this setting include aspiration or pneumonia, but consider also asymmetric noncardiogenic pulmonary edema, and amniotic fluid embolism. 2. Negative for acute thrombotic pulmonary embolism. Electronically Signed   By: Odessa FlemingH  Hall M.D.   On: 06/03/2016 11:29    MDM Pulse ox 85% initially on room air; O2 applied via mask; pt maintaining >96% on 8L Elevated BPs, none severe range -- CBC, CMP, LDH, Uric acid, & protein creatinine ratio  S/w Dr. Su Hiltoberts regarding initially assessment of patient & will order CT to r/o PE Preeclampsia labs negative Informed Dr. Su Hiltoberts of CT results - will admit patient to ICU Assessment and Plan  A:  1. Postpartum complication   2. Shortness of breath     P; Admit to ICU Routine orders placed Dr. Su Hiltoberts will enter medication orders Pt informed of plan & is agreeable to admission  Judeth Hornrin Judyann Casasola 06/03/2016, 9:24 AM

## 2016-06-03 NOTE — Lactation Note (Signed)
This note was copied from a baby's chart. Lactation Consultation Note  Patient Name: Girl Barbarann EhlersCharese Kielbasa ZOXWR'UToday's Date: 06/03/2016 Reason for consult: Follow-up assessment;NICU baby  NICU baby 316 days old. Mom readmitted and in AICU. Mom reports that she has been using a manual pump, and has an appointment with [facility]IC for 06-04-16 at 1000. [facility] as [facility] is [facility]. [facility]--at bedside. Set mom up with [facility] and she will begin pumping as soon as she has her pain medication. Mom is aware of pumping rooms in the NICU. Mom also reports that her milk supply did increase on day 4 or 5--to 7 ml. Discussed supply and demand and how stress and physical pain can impact breast milk supply. Enc mom to pump for 15 minutes followed by hand expression. Also enc mom to call for assistance as needed.  Maternal Data    Feeding    LATCH Score/Interventions                      Lactation Tools Discussed/Used     Consult Status Consult Status: Follow-up Date: 06/04/16 Follow-up type: In-patient    Geralynn OchsWILLIARD, Osualdo Hansell 06/03/2016, 2:28 PM

## 2016-06-03 NOTE — MAU Note (Signed)
Increased swelling noted since delivery, at this point feet and legs are tender to touch.  Recent headache and shortness of breath (tightness reported- though no apparent distress noted). Bleeding has slowed.

## 2016-06-03 NOTE — Progress Notes (Signed)
Patient called out for help while in bathroom.  Patient is having difficulty breathing while walking, O2 via [place] at 4 liters and was saturating at 89 percent.  O2 increased to 5 liters, MD notified.

## 2016-06-03 NOTE — Progress Notes (Signed)
  Echocardiogram 2D Echocardiogram has been performed.  Paula [name], Paula [name] 06/03/2016, 5:59 PM

## 2016-06-03 NOTE — Progress Notes (Signed)
Lab values:  BNP 201.4. Troponin 0.08.  Report called to V. Emilee HeroLatham. CNM and Dr. Normand Sloopillard - orders rec'd.

## 2016-06-03 NOTE — Progress Notes (Signed)
[facility]  Patient to be admitted to room 73.

## 2016-06-03 NOTE — Progress Notes (Signed)
Patient ambulated to bathroom without O2.  Saturation upon returning to O2 at bedside as 89.  Consulting MD with patient.

## 2016-06-04 ENCOUNTER — Encounter (HOSPITAL_COMMUNITY): Payer: Self-pay

## 2016-06-04 ENCOUNTER — Observation Stay (HOSPITAL_COMMUNITY): Payer: Medicaid Other

## 2016-06-04 DIAGNOSIS — J90 Pleural effusion, not elsewhere classified: Secondary | ICD-10-CM | POA: Diagnosis present

## 2016-06-04 DIAGNOSIS — J189 Pneumonia, unspecified organism: Secondary | ICD-10-CM | POA: Diagnosis present

## 2016-06-04 DIAGNOSIS — I10 Essential (primary) hypertension: Secondary | ICD-10-CM | POA: Diagnosis present

## 2016-06-04 DIAGNOSIS — J9600 Acute respiratory failure, unspecified whether with hypoxia or hypercapnia: Secondary | ICD-10-CM | POA: Insufficient documentation

## 2016-06-04 DIAGNOSIS — Z6841 Body Mass Index (BMI) 40.0 and over, adult: Secondary | ICD-10-CM | POA: Diagnosis not present

## 2016-06-04 DIAGNOSIS — E876 Hypokalemia: Secondary | ICD-10-CM | POA: Diagnosis present

## 2016-06-04 DIAGNOSIS — Z9889 Other specified postprocedural states: Secondary | ICD-10-CM | POA: Diagnosis not present

## 2016-06-04 DIAGNOSIS — Z79899 Other long term (current) drug therapy: Secondary | ICD-10-CM | POA: Diagnosis not present

## 2016-06-04 DIAGNOSIS — J9601 Acute respiratory failure with hypoxia: Secondary | ICD-10-CM | POA: Diagnosis present

## 2016-06-04 DIAGNOSIS — O909 Complication of the puerperium, unspecified: Secondary | ICD-10-CM | POA: Diagnosis present

## 2016-06-04 DIAGNOSIS — R0602 Shortness of breath: Secondary | ICD-10-CM | POA: Diagnosis not present

## 2016-06-04 DIAGNOSIS — O34211 Maternal care for low transverse scar from previous cesarean delivery: Secondary | ICD-10-CM | POA: Diagnosis present

## 2016-06-04 LAB — COMPREHENSIVE METABOLIC PANEL
ALT: 47 U/L (ref 14–54)
AST: 30 U/L (ref 15–41)
Albumin: 2.5 g/dL — ABNORMAL LOW (ref 3.5–5.0)
Alkaline Phosphatase: 83 U/L (ref 38–126)
Anion gap: 7 (ref 5–15)
BUN: 10 mg/dL (ref 6–20)
CO2: 26 mmol/L (ref 22–32)
Calcium: 8.3 mg/dL — ABNORMAL LOW (ref 8.9–10.3)
Chloride: 106 mmol/L (ref 101–111)
Creatinine, Ser: 0.81 mg/dL (ref 0.44–1.00)
GFR calc Af Amer: >60 mL/min (ref >60)
GFR calc non Af Amer: >60 mL/min (ref >60)
Glucose, Bld: 95 mg/dL (ref 65–99)
Potassium: 3.6 mmol/L (ref 3.5–5.1)
Sodium: 139 mmol/L (ref 135–145)
Total Bilirubin: 0.5 mg/dL (ref 0.3–1.2)
Total Protein: 6.1 g/dL — ABNORMAL LOW (ref 6.5–8.1)

## 2016-06-04 LAB — CK TOTAL AND CKMB (NOT AT ARMC)
CK, MB: 0.9 ng/mL (ref 0.5–5.0)
CK, MB: 1.6 ng/mL (ref 0.5–5.0)
CK, MB: 0.7 ng/mL (ref 0.5–5.0)
Relative Index: INVALID (ref 0.0–2.5)
Relative Index: INVALID (ref 0.0–2.5)
Relative Index: INVALID (ref 0.0–2.5)
Total CK: 78 U/L (ref 38–234)
Total CK: 72 U/L (ref 38–234)
Total CK: 68 U/L (ref 38–234)

## 2016-06-04 LAB — CBC
HCT: 25.2 % — ABNORMAL LOW (ref 36.0–46.0)
Hemoglobin: 8.3 g/dL — ABNORMAL LOW (ref 12.0–15.0)
MCH: 29.7 pg (ref 26.0–34.0)
MCHC: 32.9 g/dL (ref 30.0–36.0)
MCV: 90.3 fL (ref 78.0–100.0)
Platelets: 242 10*3/uL (ref 150–400)
RBC: 2.79 MIL/uL — ABNORMAL LOW (ref 3.87–5.11)
RDW: 14.6 % (ref 11.5–15.5)
WBC: 7.5 10*3/uL (ref 4.0–10.5)

## 2016-06-04 LAB — TROPONIN I
Troponin I: 0.06 ng/mL (ref <0.03)
Troponin I: 0.06 ng/mL (ref <0.03)
Troponin I: 0.04 ng/mL (ref <0.03)

## 2016-06-04 LAB — ECHOCARDIOGRAM COMPLETE
Height: 64 in
Weight: 4064 oz

## 2016-06-04 LAB — LACTATE DEHYDROGENASE: LDH: 218 U/L — ABNORMAL HIGH (ref 98–192)

## 2016-06-04 LAB — PROCALCITONIN: Procalcitonin: <0.1 ng/mL

## 2016-06-04 MED ORDER — GUAIFENESIN 100 MG/5ML PO SOLN: 5.0000 mL | ORAL | Status: DC | PRN

## 2016-06-04 MED ORDER — FUROSEMIDE 10 MG/ML IJ SOLN
10.0000 mg | Freq: Once | INTRAMUSCULAR | Status: AC
  Administered 2016-06-04: 10 mg via INTRAVENOUS
  Filled 2016-06-04: qty 2

## 2016-06-04 MED ORDER — FUROSEMIDE 10 MG/ML IJ SOLN
20.0000 mg | Freq: Two times a day (BID) | INTRAMUSCULAR | Status: AC
  Administered 2016-06-04 – 2016-06-05 (×3): 20 mg via INTRAVENOUS
  Filled 2016-06-04 (×4): qty 2

## 2016-06-04 MED ORDER — BISACODYL 10 MG RE SUPP
10.0000 mg | Freq: Once | RECTAL | Status: AC
  Administered 2016-06-04: 10 mg via RECTAL
  Filled 2016-06-04: qty 1

## 2016-06-04 MED ORDER — CETYLPYRIDINIUM CHLORIDE 0.05 % MT LIQD: 7.0000 mL | Freq: Two times a day (BID) | OROMUCOSAL | Status: DC

## 2016-06-04 MED ORDER — FUROSEMIDE 10 MG/ML IJ SOLN
10.0000 mg | Freq: Once | INTRAMUSCULAR | Status: DC
  Filled 2016-06-04: qty 1

## 2016-06-04 MED ORDER — MENTHOL 3 MG MT LOZG
1.0000 | LOZENGE | OROMUCOSAL | Status: DC | PRN
  Administered 2016-06-04: 3 mg via ORAL
  Filled 2016-06-04: qty 9

## 2016-06-04 MED ORDER — ALBUTEROL SULFATE (2.5 MG/3ML) 0.083% IN NEBU
2.5000 mg | INHALATION_SOLUTION | RESPIRATORY_TRACT | Status: DC | PRN
  Administered 2016-06-04: 2.5 mg via RESPIRATORY_TRACT
  Filled 2016-06-04: qty 3

## 2016-06-04 NOTE — Consult Note (Signed)
CARDIOLOGY CONSULT NOTE  Patient ID: Paula [name] MRN: [id number] DOB/AGE: 27/02/1991 27 y.o.  Admit date: 06/03/2016 Referring Physician: OB/GYN Primary Physician:  No PCP Per Patient Reason for Consultation: SOB, positive troponin  HPI: Paula [name]  is a 25 y.o. female. She is 7 days post-partum (G1P1), delivery via C-section due to placental abruption at 5336w6d gestation.  She was discharged home on 05/31/2016.  She began experiencing shortness of breath and headache the following day and returned to the hospital on 06/03/2016 as symptoms became progressively worse.  Oxygen saturations on arrival were 80%.  Chest CT was negative for PE, however revealed small to moderate bilateral pleural effusions and widespread pulmonary opacities, primarily in the right upper lobe.  She also reported bilateral lower extremity edema, however states this had been present towards the end of her pregnancy.  BNP minimally elevated at 201 and delta troponin mildly positive, but flat, with peak at 0.08.  We were consulted to evaluate for cardiac etiology of dyspnea and elevated troponin.  Patient reports improvement in symptoms since admission, however continues to have oxygen desaturations with ambulation.  She reported mild chest tightness on presentation but denies chest pain otherwise.  Denies any PND, orthopnea, dizziness, syncope, or symptoms suggestive of claudication or TIA.  No family history of cardiomyopathy.  No personal history of hypertension, hyperlipidemia, diabetes, thyroid disease, or tobacco use.  Past Medical History  Diagnosis Date  . Medical history non-contributory      Past Surgical History  Procedure Laterality Date  . Wisdom tooth extraction    . Cesarean section N/A 05/28/2016    Procedure: CESAREAN SECTION;  Surgeon: Silverio LaySandra Rivard, MD;  [facility];  Service: Obstetrics;  Laterality: N/A;     History reviewed. No pertinent family history.   Social  History: Social History   Social History  . Marital Status: Single    Spouse Name: N/A  . Number of Children: N/A  . Years of Education: N/A   Occupational History  . Not on file.   Social History Main Topics  . Smoking status: Never Smoker   . Smokeless tobacco: Never Used  . Alcohol Use: No  . Drug Use: No  . Sexual Activity: Yes   Other Topics Concern  . Not on file   Social History Narrative     Prescriptions prior to admission  Medication Sig Dispense Refill Last Dose  . ferrous sulfate 325 (65 FE) MG tablet Take 325 mg by mouth daily with breakfast.   06/02/2016 at Unknown time  . ibuprofen (ADVIL,MOTRIN) 600 MG tablet Take 1 tablet (600 mg total) by mouth every 6 (six) hours as needed for mild pain. 30 tablet 0 06/03/2016 at Unknown time  . oxyCODONE-acetaminophen (PERCOCET/ROXICET) 5-325 MG tablet Take 1 tablet by mouth every 4 (four) hours as needed (pain scale 4-7). 30 tablet 0 06/03/2016 at Unknown time  . Prenatal Vit-Fe Fumarate-FA (PRENATAL MULTIVITAMIN) TABS tablet Take 1 tablet by mouth daily at 12 noon.   06/03/2016 at Unknown time     ROS: General: no fevers/chills/night sweats Eyes: no blurry vision, diplopia, or amaurosis ENT: no sore throat or hearing loss Resp: Reports shortness of breath; no wheezing or hemoptysis CV: Reports chest tightness and edema; no PND, orthopnea, or palpitations GI: no abdominal pain, nausea, vomiting, diarrhea, or constipation GU: no dysuria, frequency, or hematuria Skin: no rash Neuro: no headache, numbness, tingling, or weakness of extremities Musculoskeletal: no joint pain or swelling Heme: no bleeding, DVT,  or easy bruising Endo: no polydipsia or polyuria    Physical Exam: Blood pressure 120/86, pulse 83, temperature 98.4 F (36.9 C), temperature source Oral, resp. rate 36, height 5\' 4"  (1.626 m), weight 115.214 kg (254 lb), SpO2 81 %, unknown if currently breastfeeding.   General appearance: alert, cooperative,  appears stated age, no distress and morbidly obese Neck: no adenopathy, no carotid bruit, no JVD, supple, symmetrical, trachea midline and thyroid not enlarged, symmetric, no tenderness/mass/nodules Lungs: diminished breath sounds bilaterally Heart: regular rate and rhythm, S1, S2 normal, no murmur, click, rub or gallop Extremities: edema 2+ bilaterally Pulses: 2+ and symmetric Skin: Skin color, texture, turgor normal. No rashes or lesions Neurologic: Grossly normal  Labs:   Lab Results  Component Value Date   WBC 7.5 06/04/2016   HGB 8.3* 06/04/2016   HCT 25.2* 06/04/2016   MCV 90.3 06/04/2016   PLT 242 06/04/2016    Recent Labs Lab 06/04/16 0529  NA 139  K 3.6  CL 106  CO2 26  BUN 10  CREATININE 0.81  CALCIUM 8.3*  PROT 6.1*  BILITOT 0.5  ALKPHOS 83  ALT 47  AST 30  GLUCOSE 95    Lipid Panel  No results found for: CHOL, TRIG, HDL, CHOLHDL, VLDL, LDLCALC  BNP (last 3 results)  Recent Labs  06/03/16 1743  BNP 201.4*    HEMOGLOBIN A1C No results found for: HGBA1C, MPG  Cardiac Panel (last 3 results)  Recent Labs  06/03/16 1743 06/04/16 0129 06/04/16 0529  CKTOTAL  --  78 72  CKMB  --  0.9 1.6  TROPONINI 0.08* 0.06* 0.06*  RELINDX  --  RELATIVE INDEX IS INVALID RELATIVE INDEX IS INVALID    Lab Results  Component Value Date   CKTOTAL 72 06/04/2016   CKMB 1.6 06/04/2016   TROPONINI 0.06* 06/04/2016     TSH No results for input(s): TSH in the last 8760 hours.  EKG 06/03/2016: Sinus rhythm at a rate of 93 bpm, normal axis, normal intervals, no evidence of ischemia.  Echo 06/04/2016: Left ventricle: The cavity size was normal. Wall thickness was normal. Systolic function was normal. The estimated ejection fraction was in the range of 60% to 65%. Wall motion was normal; there were no regional wall motion abnormalities. Left ventricular diastolic function parameters were normal.   Radiology: Ct Angio Chest Pe W/cm &/or Wo Cm  06/03/2016  CLINICAL  DATA:  25 year old postpartum female, recently status post C-section, with shortness of breath and cough. Initial encounter. EXAM: CT ANGIOGRAPHY CHEST WITH CONTRAST TECHNIQUE: Multidetector CT imaging of the chest was performed using the standard protocol during bolus administration of intravenous contrast. Multiplanar CT image reconstructions and MIPs were obtained to evaluate the vascular anatomy. CONTRAST:  100 mL Isovue 370. COMPARISON:  None. FINDINGS: Adequate contrast bolus timing in the pulmonary arterial tree. Respiratory and/or cardiac motion artifact throughout the study, mild. No focal filling defect identified in the pulmonary arteries to suggest the acute pulmonary embolism. Moderate layering right and small layering left pleural effusions. No pericardial effusion. Major airways are patent. There is confluent abnormal right upper lobe pulmonary opacity with early perihilar air bronchograms. There is similarly involvement in the superior aspect of the right middle lobe. There is mild left upper lobe peribronchial mostly ground-glass opacity. There is bilateral lower lobe peribronchial opacity, which is more confluent in both costophrenic angles. No thoracic lymphadenopathy. Negative thoracic inlet. Negative visualized upper abdominal viscera including the liver, spleen, pancreas, and stomach. No osseous abnormality  identified. Review of the MIP images confirms the above findings. IMPRESSION: 1. Positive for small to moderate right greater than left layering pleural effusions plus widespread bilateral abnormal pulmonary opacity most pronounced in the right upper lobe. Top differential considerations in this setting include aspiration or pneumonia, but consider also asymmetric noncardiogenic pulmonary edema, and amniotic fluid embolism. 2. Negative for acute thrombotic pulmonary embolism. Electronically Signed   By: Odessa Fleming M.D.   On: 06/03/2016 11:29   Dg Chest Port 1 View  06/04/2016  CLINICAL DATA:   Acute respiratory failure, postpartum complications. EXAM: PORTABLE CHEST 1 VIEW COMPARISON:  Chest CT scan of June 03, 2016 FINDINGS: The lungs are hypoinflated. There is alveolar infiltrate in the right upper lobe. The interstitial markings are coarse elsewhere and there is increased density in the retrocardiac region on the left. No significant pleural effusion is visible. The cardiac silhouette is enlarged. The central pulmonary vascularity is prominent. The observed bony thorax is unremarkable. IMPRESSION: Bilateral pneumonia. Mild pulmonary interstitial edema likely of cardiac cause. Electronically Signed   By: David  [place] M.D.   On: 06/04/2016 07:32    Scheduled Meds: . azithromycin  500 mg Intravenous Q24H  . cefTRIAXone (ROCEPHIN)  IV  2 g Intravenous Q24H  . potassium chloride  20 mEq Oral Daily   Continuous Infusions:  PRN Meds:.acetaminophen, albuterol, ibuprofen, menthol-cetylpyridinium, oxyCODONE-acetaminophen  ASSESSMENT AND PLAN:  1. Shortness of breath 2. Hypoxemia 3. Acute respiratory failure secondary to PNA 4. Lower extremity edema 5. Positive troponin; flat; peak 0.08 6. 7 days postpartum; C-section  Recommendation: Echocardiogram reveals normal systolic and diastolic function.  Chest CT negative for PE.  Suspect elevated troponin secondary to demand ischemia from hypoxemia.  Low risk for CAD. She is being treated accordingly for pneumonia.  Continues to remain oxygen dependent.   Erling Conte, NP-C 06/04/2016, 1:07 PM [facility] Pager: [phone] Office: [phone]

## 2016-06-04 NOTE — Progress Notes (Signed)
Portable chest Xray completed.

## 2016-06-04 NOTE — Progress Notes (Signed)
Paged Dr. Nadara EatonGangi to get ETA.  MD called back immediately, he will be here within the next hour.  [facility], patient will require oxygen support while on floor.

## 2016-06-04 NOTE — Progress Notes (Signed)
Patient ambulated to bathroom with 2 L/[place] on.

## 2016-06-04 NOTE — Progress Notes (Signed)
Call into Dr. Nadara EatonGangi at Patient's Mother's request.

## 2016-06-04 NOTE — Plan of Care (Signed)
Problem: Respiratory: Goal: Ability to maintain adequate ventilation will improve Outcome: Progressing Patient treated with lasix, ABT and O2.

## 2016-06-04 NOTE — Progress Notes (Signed)
Paged Dr. Nadara EatonGangi for Dr. Su Hiltoberts.  Dr. Nadara EatonGangi, Cardiology, @ [phone].  Immediate response from cath lab tech, gave message to have MD assess patient.  Tech stated that MD was cathing today.

## 2016-06-04 NOTE — Progress Notes (Signed)
Subjective: Postpartum Day 7: Cesarean Delivery Patient reports tolerating PO, + flatus and no problems voiding.    Objective: Vital signs in last 24 hours: Temp:  [98.4 F (36.9 C)-100.8 F (38.2 C)] 98.8 F (37.1 C) (07/11 0804) Pulse Rate:  [71-100] 89 (07/11 0928) Resp:  [0-41] 37 (07/11 0928) BP: (128-143)/(68-90) 142/90 mmHg (07/11 0859) SpO2:  [80 %-100 %] 100 % (07/11 0851) Weight:  [254 lb (115.214 kg)] 254 lb (115.214 kg) (07/10 1300)  Physical Exam:  General: alert and mild distress Lochia: no vaginal bleeding Uterine Fundus: firm Incision: dressing c/d/i DVT Evaluation: No evidence of DVT seen on physical exam.   Recent Labs  06/03/16 1743 06/04/16 0529  HGB 9.0* 8.3*  HCT 27.6* 25.2*   Troponin 0.08, 0.06, 0.06  Assessment/Plan: Status post Cesarean section. Postoperative course complicated by SOB, pneumonia, pleural effusions and interstitial edema likely secondary to cardiac cause.  Continue current care. Pulmonary has signed off on the pt and recommended continuing course of abx for total of 10 days and continue diuresis Albuterol ordered as needed Troponin level increased with nl CK-MB and EKG without signs of ischemia but shows possible left atrial enlargement Cardiology consult ordered - Dr. Jacinto HalimGanji [phone])  Osborn CohoOBERTS,Jenisis Harmsen Y 06/04/2016, 10:03 AM

## 2016-06-04 NOTE — Progress Notes (Signed)
Received call from Dr. Nadara EatonGangi, he will be up to see patient later this date, I have relayed such to Dr. Su Hiltoberts.  No further.

## 2016-06-04 NOTE — Progress Notes (Signed)
eLink Physician-Brief Progress Note Patient Name: Paula [name] DOB: 09/29/1991 MRN: [id number]   Date of Service  06/04/2016  HPI/Events of Note  Notified by bedside nurse of troponin I 0.08.   eICU Interventions  1. Trending troponin I every 6 hours 2. Trending CK-MB every 6 hours      Intervention Category Intermediate Interventions: Other:  Lawanda CousinsJennings Darreld Hoffer 06/04/2016, 1:00 AM

## 2016-06-04 NOTE — Consult Note (Signed)
Name: Paula [name] MRN: [id number] DOB: 06/22/1991    ADMISSION DATE:  06/03/2016 CONSULTATION DATE:  06/03/16  REFERRING MD :  Dr. Su Hiltoberts   CHIEF COMPLAINT:  Shortness of Breath  HISTORY OF PRESENT ILLNESS:  25 y/o F, never smoker, with PMH of morbid obesity (BMI 41), prior abnormal PAP, + HPV & recent (7/4) partial placental abruption resulting in cesarean section who [facility] with shortness of breath and headache.    Upon initial assessment, she was noted to have room air saturations of 80%.  She was placed on 8L O2 with improvement to 97%. Exam notes also reflect 2+ edema.  Given symptoms, CTA of the chest assessed on presentation which was negative for PE but demonstrated small to moderate R>L layering pleural effusions with widespread bilateral abnormal pulmonary opacity most pronounced in the RUL.  The patient was treated with empiric antibiotics - rocephin / azithromycin and 10 mg IV lasix.    She reports she began swelling after she was discharged home.  She has noted increased dyspnea since discharge.  She had fever prior to delivery but none since.  She also endorses several episodes of vomiting after delivery.  She developed a dry, non-productive cough on day of presentation.  Denies chest pain but endorses feeling as if she can not take a deep breath and feels more comfortable upright.  She denies syncope, pre-syncope, palpitations, abd pain.  Reports incision clean / dry.    PCCM consulted for evaluation of dyspnea and abnormal CT.    PAST MEDICAL HISTORY :   has a past medical history of Medical history non-contributory.  has past surgical history that includes Wisdom tooth extraction and Cesarean section (N/A, 05/28/2016).   Prior to Admission medications   Medication Sig Start Date End Date Taking? Authorizing Provider  ferrous sulfate 325 (65 FE) MG tablet Take 325 mg by mouth daily with breakfast.   Yes Historical Provider, [place]    ibuprofen (ADVIL,MOTRIN) 600 MG tablet Take 1 tablet (600 mg total) by mouth every 6 (six) hours as needed for mild pain. 05/31/16  Yes Hoover BrownsEma Kulwa, MD  oxyCODONE-acetaminophen (PERCOCET/ROXICET) 5-325 MG tablet Take 1 tablet by mouth every 4 (four) hours as needed (pain scale 4-7). 05/31/16  Yes Hoover BrownsEma Kulwa, MD  Prenatal Vit-Fe Fumarate-FA (PRENATAL MULTIVITAMIN) TABS tablet Take 1 tablet by mouth daily at 12 noon.   Yes Historical Provider, [place]   No Known Allergies  FAMILY HISTORY:  family history is not on file.   SOCIAL HISTORY:  reports that she has never smoked. She has never used smokeless tobacco. She reports that she does not drink alcohol or use illicit drugs.  REVIEW OF SYSTEMS:  POSITIVES IN BOLD Constitutional: Negative for fever, chills, weight loss, malaise/fatigue and diaphoresis.  HENT: Negative for hearing loss, ear pain, nosebleeds, congestion, sore throat, neck pain, tinnitus and ear discharge.   Eyes: Negative for blurred vision, double vision, photophobia, pain, discharge and redness.  Respiratory: Negative for cough, hemoptysis, sputum production, shortness of breath, wheezing and stridor.   Cardiovascular: Negative for chest pain, palpitations, orthopnea, claudication, leg swelling and PND.  Gastrointestinal: Negative for heartburn, nausea, vomiting, abdominal pain, diarrhea, constipation, blood in stool and melena.  Genitourinary: Negative for dysuria, urgency, frequency, hematuria and flank pain.  Musculoskeletal: Negative for myalgias, back pain, joint pain and falls.  Skin: Negative for itching and rash.  Neurological: Negative for dizziness, tingling, tremors, sensory change, speech change, focal weakness, seizures, loss  of consciousness, weakness and headaches.  Endo/Heme/Allergies: Negative for environmental allergies and polydipsia. Does not bruise/bleed easily.  SUBJECTIVE:   VITAL SIGNS: Temp:  [98.3 F (36.8 C)-100.8 F (38.2 C)] 98.8 F (37.1 C) (07/11  0804) Pulse Rate:  [76-100] 82 (07/11 0804) Resp:  [12-41] 33 (07/11 0804) BP: (128-143)/(68-96) 143/87 mmHg (07/11 0804) SpO2:  [80 %-100 %] 98 % (07/11 0625) Weight:  [254 lb (115.214 kg)] 254 lb (115.214 kg) (07/10 1300)  PHYSICAL EXAMINATION: General:  No distress Neuro:  No focal deficits HEENT: Moist mucus membranes Cardiovascular:  S1, S2, RRR, No MRG, 2+ edema Lungs:  No resp distress. Diminished breath sound. No wheeze or crackles Abdomen:  Obese, + BS. Non tender.  Musculoskeletal:  No acute deformities  Skin:  Warm/dry, No rash   Recent Labs Lab 06/03/16 0926 06/03/16 1743 06/04/16 0529  NA 138 138 139  K 3.3* 3.6 3.6  CL 107 104 106  CO2 24 27 26   BUN 13 10 10   CREATININE 0.81 0.81 0.81  GLUCOSE 88 90 95    Recent Labs Lab 06/03/16 0926 06/03/16 1743 06/04/16 0529  HGB 8.2* 9.0* 8.3*  HCT 24.7* 27.6* 25.2*  WBC 8.2 8.5 7.5  PLT 234 269 242   Ct Angio Chest Pe W/cm &/or Wo Cm  06/03/2016  CLINICAL DATA:  25 year old postpartum female, recently status post C-section, with shortness of breath and cough. Initial encounter. EXAM: CT ANGIOGRAPHY CHEST WITH CONTRAST TECHNIQUE: Multidetector CT imaging of the chest was performed using the standard protocol during bolus administration of intravenous contrast. Multiplanar CT image reconstructions and MIPs were obtained to evaluate the vascular anatomy. CONTRAST:  100 mL Isovue 370. COMPARISON:  None. FINDINGS: Adequate contrast bolus timing in the pulmonary arterial tree. Respiratory and/or cardiac motion artifact throughout the study, mild. No focal filling defect identified in the pulmonary arteries to suggest the acute pulmonary embolism. Moderate layering right and small layering left pleural effusions. No pericardial effusion. Major airways are patent. There is confluent abnormal right upper lobe pulmonary opacity with early perihilar air bronchograms. There is similarly involvement in the superior aspect of the  right middle lobe. There is mild left upper lobe peribronchial mostly ground-glass opacity. There is bilateral lower lobe peribronchial opacity, which is more confluent in both costophrenic angles. No thoracic lymphadenopathy. Negative thoracic inlet. Negative visualized upper abdominal viscera including the liver, spleen, pancreas, and stomach. No osseous abnormality identified. Review of the MIP images confirms the above findings. IMPRESSION: 1. Positive for small to moderate right greater than left layering pleural effusions plus widespread bilateral abnormal pulmonary opacity most pronounced in the right upper lobe. Top differential considerations in this setting include aspiration or pneumonia, but consider also asymmetric noncardiogenic pulmonary edema, and amniotic fluid embolism. 2. Negative for acute thrombotic pulmonary embolism. Electronically Signed   By: Odessa Fleming M.D.   On: 06/03/2016 11:29   Dg Chest Port 1 View  06/04/2016  CLINICAL DATA:  Acute respiratory failure, postpartum complications. EXAM: PORTABLE CHEST 1 VIEW COMPARISON:  Chest CT scan of June 03, 2016 FINDINGS: The lungs are hypoinflated. There is alveolar infiltrate in the right upper lobe. The interstitial markings are coarse elsewhere and there is increased density in the retrocardiac region on the left. No significant pleural effusion is visible. The cardiac silhouette is enlarged. The central pulmonary vascularity is prominent. The observed bony thorax is unremarkable. IMPRESSION: Bilateral pneumonia. Mild pulmonary interstitial edema likely of cardiac cause. Electronically Signed   By: David  [place]  M.D.   On: 06/04/2016 07:32     SIGNIFICANT EVENTS  7/4-7/7  Admit with partial placental abruption, C-Secection  7/10  Admit with SOB, headache, increased swelling since delivery.  CTA chest negative for PE, R>L opacities, bilateral effusions  STUDIES:  ECHO 7/10 >>  CXR 7/11 >> B/L opacities, no effusion  ANTIBIOTICS:   Rocephin 7/10 >>  Azithromycin 7/10 >>   ASSESSMENT / PLAN: Acute post partum resp failure May secondary to non cardiac edema, CHF, pneumonia Doubt this is amniotic fluid embolism as the presentation is 6 days after the C section.  Plan: Pct is low but would still give a full course of 7 days abx as lung imaging is consistent with PNA Continue ceftriaxone, azithro. Can convert to Levaquin when ready to be discharged Wean down O2. Assess ambulatory O2 sats before discharge. Albuterol nebs PRN Continue lasix for diuresis Follow echo Stable for transfer from ICU.  Discussed with Dr. Berdie Ogren MD [facility] Pager [phone] If no answer or after 3pm call: [phone] 06/04/2016, 8:37 AM

## 2016-06-04 NOTE — Progress Notes (Signed)
Patient having heaviness in chest.  Called RT for breathing treatment per PRN order. Patient resting, no distress.

## 2016-06-04 NOTE — Plan of Care (Signed)
Problem: Fluid Volume: Goal: Ability to maintain a balanced intake and output will improve Outcome: Progressing Patient receiving lasix

## 2016-06-05 LAB — PROCALCITONIN: Procalcitonin: <0.1 ng/mL

## 2016-06-05 MED ORDER — FUROSEMIDE 10 MG/ML IJ SOLN
20.0000 mg | Freq: Two times a day (BID) | INTRAMUSCULAR | Status: DC
  Administered 2016-06-05: 20 mg via INTRAVENOUS
  Filled 2016-06-05 (×2): qty 2

## 2016-06-05 MED ORDER — FUROSEMIDE 10 MG/ML IJ SOLN
20.0000 mg | Freq: Two times a day (BID) | INTRAMUSCULAR | Status: DC
  Administered 2016-06-06 – 2016-06-07 (×3): 20 mg via INTRAVENOUS
  Filled 2016-06-05 (×5): qty 2

## 2016-06-05 NOTE — Progress Notes (Signed)
Paula [name] was tearful as she reflected on her separation from her baby, due to her own health conditions.  She is looking forward to doing some more bonding with her baby soon.  She spoke extensively about her relationship with her boyfriend, Paula [name], whom she has known for 5 years and who lives in [place].  He has been only intermittently supportive during the pregnancy and does not wish to move down here.  He currently lives with his parents in [place] and she currently lives with her mother here, though she did live on her own for a period of several years prior to moving back in with her mother.  She relayed a difficult dynamic with  Kevin's parents which has caused her significant stress.    I provided reflective listening and pastoral presence as she processed her experiences throughout pregnancy and since delivery.  I also showed her a mindfulness technique to refocus her energy on her own healing and bonding with her baby when she becomes overly stressed by the challenging family dynamics.  Chaplain Dyanne CarrelKaty Lurae [name], Bcc Pager, [phone] 4:34 PM    06/05/16 1600  Clinical Encounter Type  Visited With Patient  Visit Type Spiritual support  Referral From Nurse;Other (Comment) (NICU Discharge Planning Team)

## 2016-06-05 NOTE — Progress Notes (Signed)
Subjective: Postpartum Day 8: Cesarean Delivery Patient reports feeling a little better.  She is still on oxygen.      Objective: Vital signs in last 24 hours: Temp:  [98.6 F (37 C)-99.4 F (37.4 C)] 99.4 F (37.4 C) (07/12 1252) Pulse Rate:  [78-92] 87 (07/12 1252) Resp:  [29-36] 29 (07/12 1252) BP: (123-153)/(64-92) 140/70 mmHg (07/12 1252) SpO2:  [92 %-100 %] 96 % (07/12 1252)  Physical Exam:  General: alert and no distress Lochia: appropriate Uterine Fundus: firm Incision: honeycomb dressing removed, incision with steristrips in place DVT Evaluation: No evidence of DVT seen on physical exam. Lungs decreased and bases and up to mid back on the right   Recent Labs  06/03/16 1743 06/04/16 0529  HGB 9.0* 8.3*  HCT 27.6* 25.2*    Assessment/Plan: Status post Cesarean section. Postoperative course complicated by pneumonia  Continue current care. Cont Abx Consider discharge when pt no longer requiring oxygen (pt desat to 85% on RA)  Tetsuo Coppola Y 06/05/2016, 3:22 PM

## 2016-06-06 ENCOUNTER — Inpatient Hospital Stay (HOSPITAL_COMMUNITY): Payer: Medicaid Other

## 2016-06-06 LAB — CBC WITH DIFFERENTIAL/PLATELET
Basophils Absolute: 0 10*3/uL (ref 0.0–0.1)
Basophils Relative: 0 %
Eosinophils Absolute: 0.1 10*3/uL (ref 0.0–0.7)
Eosinophils Relative: 2 %
HCT: 27.6 % — ABNORMAL LOW (ref 36.0–46.0)
Hemoglobin: 9 g/dL — ABNORMAL LOW (ref 12.0–15.0)
Lymphocytes Relative: 26 %
Lymphs Abs: 1.8 10*3/uL (ref 0.7–4.0)
MCH: 29.2 pg (ref 26.0–34.0)
MCHC: 32.6 g/dL (ref 30.0–36.0)
MCV: 89.6 fL (ref 78.0–100.0)
Monocytes Absolute: 0.3 10*3/uL (ref 0.1–1.0)
Monocytes Relative: 5 %
Neutro Abs: 4.6 10*3/uL (ref 1.7–7.7)
Neutrophils Relative %: 67 %
Platelets: 307 10*3/uL (ref 150–400)
RBC: 3.08 MIL/uL — ABNORMAL LOW (ref 3.87–5.11)
RDW: 14.6 % (ref 11.5–15.5)
WBC: 6.8 10*3/uL (ref 4.0–10.5)

## 2016-06-06 LAB — COMPREHENSIVE METABOLIC PANEL
ALT: 25 U/L (ref 14–54)
AST: 15 U/L (ref 15–41)
Albumin: 2.5 g/dL — ABNORMAL LOW (ref 3.5–5.0)
Alkaline Phosphatase: 82 U/L (ref 38–126)
Anion gap: 9 (ref 5–15)
BUN: 13 mg/dL (ref 6–20)
CO2: 28 mmol/L (ref 22–32)
Calcium: 8.7 mg/dL — ABNORMAL LOW (ref 8.9–10.3)
Chloride: 103 mmol/L (ref 101–111)
Creatinine, Ser: 0.82 mg/dL (ref 0.44–1.00)
GFR calc Af Amer: >60 mL/min (ref >60)
GFR calc non Af Amer: >60 mL/min (ref >60)
Glucose, Bld: 96 mg/dL (ref 65–99)
Potassium: 3.6 mmol/L (ref 3.5–5.1)
Sodium: 140 mmol/L (ref 135–145)
Total Bilirubin: 0.4 mg/dL (ref 0.3–1.2)
Total Protein: 6.3 g/dL — ABNORMAL LOW (ref 6.5–8.1)

## 2016-06-06 NOTE — Lactation Note (Signed)
This note was copied from a baby's chart. Lactation Consultation Note  Patient Name: Paula [name] Reason for consult: Follow-up assessment   With this mom of a now term baby, in NICU, and 469 days old. Mom has been sick, but is doing well enough to try breastfeeding today,for the first time. Paula [name] latched without shield, but would suckle a few time, and come off.Mom has everted but soft nipples, and c Paula [name] is used to boltte feeding.  Mom has milk easily expressed, with steady flow of milk. I then tried a 24 nipple shield, and she latched well with strong rhythmic suckles, for about 5 minutes, and then baby became restless, then very angry. Baby was being gavage fed during this, and this was stopped, and the milk was transferred to a bottle, and she calmed with bottle feeding. Mom pleased with this attempt, and I encouraged her to keep calling lactation for help with breast feeidng, and let mom know that she can return to lactation after the baby is discharged to home.    Maternal Data    Feeding Feeding Type: Breast Fed Length of feed: 15 min (on and off, with 24 nipple shiled, and then without)  LATCH Score/Interventions Latch: Repeated attempts needed to sustain latch, nipple held in mouth throughout feeding, stimulation needed to elicit sucking reflex. Intervention(s): Adjust position;Assist with latch;Breast compression  Audible Swallowing: A few with stimulation  Type of Nipple: Everted at rest and after stimulation (soft, babyused to nipple, used nipple shiled, baby with strong, rhythmic suckles, milk in shiled, but then crying)  Comfort (Breast/Nipple): Soft / non-tender     Hold (Positioning): Assistance needed to correctly position infant at breast and maintain latch. Intervention(s): Breastfeeding basics reviewed;Support Pillows;Position options;Skin to skin  LATCH Score: 7  Lactation Tools Discussed/Used WIC Program: Yes (mom to call  WIC today, and make appointment for DEP for d/c tomorrow/ 7/14)   Consult Status Consult Status: Follow-up Date: 06/07/16 Follow-up type: In-patient    Paula [name], Paula [name] [name], 2:39 PM

## 2016-06-06 NOTE — Progress Notes (Signed)
CLINICAL SOCIAL WORK MATERNAL/CHILD NOTE  Patient Details  Name: Paula [name] MRN: [id number] Date of Birth: 05/28/2018  Date: 06/06/2016  Clinical Social Worker Initiating Note: Lilly C[place], LCSWDate/ Time Initiated: 06/06/16/1555   Child's Name:     Legal Guardian: Mother   Need for Interpreter: None   Date of Referral:     Reason for Referral: Other (Comment) (NICU admission, MOB medical issues )   Referral Source: NICU   Address:    Phone number:     Household Members: Significant Other   Natural Supports (not living in the home): Community, Extended Family, Friends, Immediate Family   Professional Supports:None   Employment:Full-time   Type of Work:     Education: Engineer, maintenance Resources:Medicaid   Other Resources: [employer], Physicist, medical    Cultural/Religious Considerations Which May Impact Care: none reported  Strengths: Ability to meet basic needs , Compliance with medical plan , Home prepared for child , Pediatrician chosen    Risk Factors/Current Problems: None   Cognitive State: Alert , Insightful , Linear Thinking    Mood/Affect: Relaxed , Comfortable    CSW Assessment:LCSW was called by RN notifying LCSW MOB was in NICU and MOB requested to speak to LCSW. LCSW met with FOB and MOB at the bedside. MOB was holding baby, bonding well and reports attempting to breast feed, but struggling with LATCH due to baby use to bottles. MOB processes her emotions currently and physical symptoms and reports she is doing much better. She is adjusting to being a mother and baby being in NICU. She questions about Medicaid for the baby and WIC. LCSW provided information and education about how to enroll baby onto [facility] and [facility] information. MOB voices no other concerns or issues. Her chart was reviewed and no current or during prenatal care psycho-social stressors noted. LCSW explained role and  services and if MOB had any questions or needed anything how to reach LCSW.  Will follow while MOB in hospital and baby in NICU. MOB aware of how to reach LCSW. FOB voices not concerns. LCSW will continue to follow and make appropriate referrals if warranted.  CSW Plan/Description: Information/Referral to [employer] , Psychosocial Support and Ongoing Assessment of Needs    Marshell Garfinkel 06/06/2016, 3:56 PM

## 2016-06-06 NOTE — Lactation Note (Signed)
This note was copied from a baby's chart. Lactation Consultation Note  Patient Name: Paula [name] ONGEX'BToday's Date: 06/06/2016 Reason for consult: Follow-up assessment  With this mom of a 249 day old NICU baby,. Mom readmitted for pulmonary edema/pnuemonia. Mom is doing better now, and will be discharged to home tomorrow. Mom was assisted with pumping, and is expressing milk, in a steady stream  In maintenance setting. I showed mom how to hand express, and advised her to pump every 3 hours, 8 times a da, stay hydrated. She is on lasix, and I explained that she needs to drink with each pumping, to produce milk - not allow herself to be thirsty. Mom active with WIC. Mom to call them today, and make appointment to pick up DEP tomorrow, after discharge.    Maternal Data    Feeding Feeding Type: Formula Nipple Type: Slow - flow Length of feed: 30 min  LATCH Score/Interventions                      Lactation Tools Discussed/Used WIC Program: Yes (mom to call WIC today, and make appointment for DEP for d/c tomorrow/ 7/14)   Consult Status Consult Status: Follow-up Date: 06/07/16 Follow-up type: In-patient    Alfred LevinsLee, Han Lysne Anne 06/06/2016, 11:51 AM

## 2016-06-06 NOTE — Progress Notes (Signed)
Subjective: Patient reports tolerating PO, + flatus and + BM.  Pt declines having CO or SOB  Objective: I have reviewed patient's vital signs, intake and output and medications. BP 117/66 mmHg  Pulse 99  Temp(Src) 98.7 F (37.1 C) (Oral)  Resp 18  Ht 5\' 4"  (1.626 m)  Wt 239 lb 8 oz (108.636 kg)  BMI 41.09 kg/m2  SpO2 98%   General: alert and cooperative Resp: diminished breath sounds bilaterally, LLL and RLL Cardio: regular rate and rhythm GI: soft, non-tender; bowel sounds normal; no masses,  no organomegaly Extremities: Homans sign is negative, no sign of DVT Vaginal Bleeding: minimal   Assessment/Plan: POD #9 s/p CS with pnuemonia CXR still with plerual effusions.  Will continue Lasix Wean O2 Continue care   LOS: 3 days    Paula [name] A 06/06/2016, 2:21 PM

## 2016-06-06 NOTE — Progress Notes (Signed)
I have ordered  CXR to see if the infiltrates have improved especially pleural effusion.  If congestion still present, will continue IV lasix for 24 hours.   Yates DecampGANJI, Nature Kueker, MD 06/06/2016, 7:00 AM [facility] Pager: [phone] Office: [phone] If no answer: Cell:  [phone]

## 2016-06-07 MED ORDER — AZITHROMYCIN 500 MG PO TABS: 500.0000 mg | ORAL_TABLET | Freq: Every day | ORAL | Status: DC

## 2016-06-07 MED ORDER — AMOXICILLIN-POT CLAVULANATE 875-125 MG PO TABS
1.0000 | ORAL_TABLET | Freq: Two times a day (BID) | ORAL | Status: DC
Start: 2016-06-07 — End: 2017-02-25

## 2016-06-07 MED ORDER — TRIAMTERENE-HCTZ 37.5-25 MG PO TABS: 1.0000 | ORAL_TABLET | Freq: Every day | ORAL | Status: DC

## 2016-06-07 MED ORDER — ALBUTEROL SULFATE (2.5 MG/3ML) 0.083% IN NEBU: 2.5000 mg | INHALATION_SOLUTION | RESPIRATORY_TRACT | Status: DC | PRN

## 2016-06-07 NOTE — Lactation Note (Signed)
This note was copied from a baby'Paula chart. Lactation Consultation Note  Patient Name: Paula [name] ZOXWR'UToday'Paula Date: 06/07/2016 Reason for consult: Follow-up assessment;NICU baby;Infant < 6lbs   Follow up with mom. Mom readmitted for pneumonia. Infant is in NICU. Mom is concerned with milk supply. She reports she is pumping every 3 hours with DEBP and getting very small volumes. Mom reports she does not have a DEBP at home. She used a hand pump for the few days she was home before being readmitted. She is planning to call WIC again today to see if they have a pump available. Advised mom that a DEBP would be in her best interest to stimulate a supply with having a NICU infant, medical complications and need for Lasix. Mom is on Lasix for Pneumonia. Mom reports [facility] She reports she is sore with pumping. Paula Housemananya, RN is to call [place] for [employer].   Mom is pumping on high and reports that it is painful to pump, advised mom to decrease suction on pump to comfortable level. Discussed massage before and during pumping as mom reports massage provided more milk yesterday. Enc dad to assist with massaging during pumping.   Left mom my number for her to call for pump rental before d/c if she wants one. Gave mom handout on [place] to discuss with her [place]. Laverle PatterMom is hoping to be d/c home today. Follow up prn.    Maternal Data    Feeding    LATCH Score/Interventions                      Lactation Tools Discussed/Used WIC Program: Yes Initiated by:: Reviewed by Paula [name]   Consult Status Consult Status: PRN Follow-up type: Call as needed    Paula [name] 06/07/2016, 9:35 AM

## 2016-06-07 NOTE — Progress Notes (Signed)
I checked in with Paula [name] before her own discharge from the hospital after PP complications.  She is ready to be home, but is sad to leave her baby again.  She reported that things are going better today with family communication and that her SO, [name], will be able to stay with her this weekend while she rests and heals.  They will be able to come up and visit their baby together and continue to bond with her.  Chaplain Dyanne CarrelKaty Tomie Spizzirri, Bcc Pager, [phone] 3:03 PM    06/07/16 1500  Clinical Encounter Type  Visited With Patient  Visit Type Spiritual support;Follow-up

## 2016-06-07 NOTE — Discharge Summary (Signed)
  Obstetric Discharge Summary  Reason for Admission: readmitted on POD#6 (of a primary cesarean section for placental abruptio and NRFHR) for acute respiratory failure   HEMOGLOBIN  Date Value Ref Range Status  06/06/2016 9.0* 12.0 - 15.0 g/dL Final   HCT  Date Value Ref Range Status  06/06/2016 27.6* 36.0 - 46.0 % Final    Discharge Diagnoses: cardiogenic respiratory failure due to HTN, obesity and post-operative course with atypical pneumonia  Physical exam:   General: normal  Extremities: No evidence of DVT seen on physical exam. Edema 2+ much improved   Hospital course: Supportive measures with O2 and IV Lasix. Ceftriaxone and Azithromycin IV for 4 days. Pulmology consultation with Dr Chilton GreathousePraveen Mannam. Cardiology consultation with Dr Geraldo DockerJay Gangii.                               Negative chest CT-Scan for pulmonary embolus. CXR with bilateral pleural effusions, almost resolved at discharge. Normal echo cardiogram.   Date: 06/07/2016 Activity: unrestricted Diet: routine with salt restriction Medications: Ibuprofen, Percocet and Maxzide daily Condition: stable   Instructions: salt restricted diet. Complet 3 more days of antibiotics. Will follow-up with Dr Justin MendGangii in 2-3 weeks: PLEASE CALL TO SCHEDULE YOUR APPOINTMENT. Discharge to: home Follow-up Information    Call Yates DecampGANJI, JAY, MD.   Specialty:  Cardiology   Why:  To be seen in 2-3 weeks   Contact information:   [address] [place] [address] [phone]        BMWUXL,KGMWNURIVARD,Jakerria Kingbird A MD 06/07/2016, 10:31 AM

## 2017-01-08 IMAGING — US US OB TRANSVAGINAL
1 series · 15 of 28 positions shown · non-contrast
Comparison: None.

CLINICAL DATA: Pelvic pain and cramping for 2 weeks. Gestational
age by LMP of 13 weeks 0 days.

EXAM:
OBSTETRIC <14 WK US AND TRANSVAGINAL OB US
TECHNIQUE: Both transabdominal and transvaginal ultrasound examinations were
performed for complete evaluation of the gestation as well as the
maternal uterus, adnexal regions, and pelvic cul-de-sac.
Transvaginal technique was performed to assess early pregnancy.

[Series 1: us ob transvaginal · 15 of 49 slices shown]
[im 1/49]
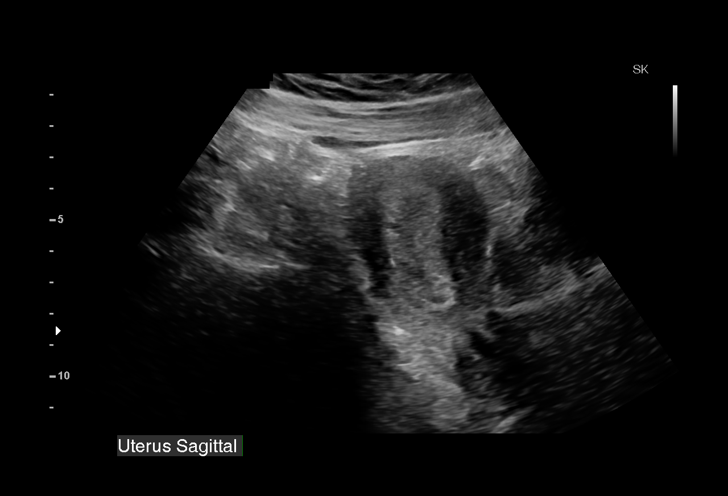
[im 4/49]
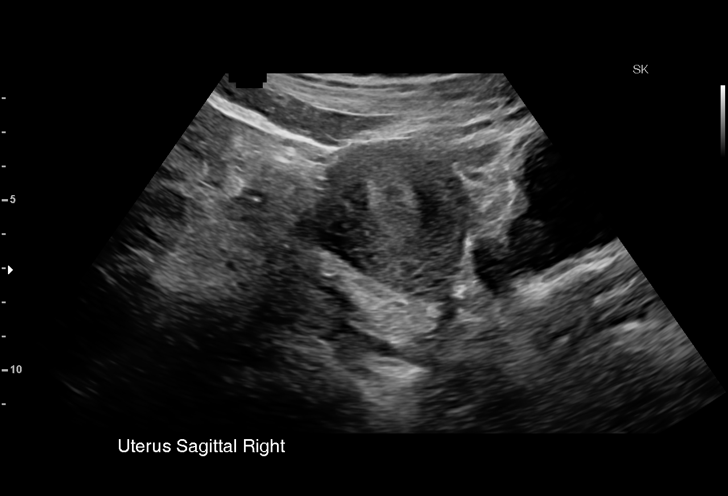
[im 8/49]
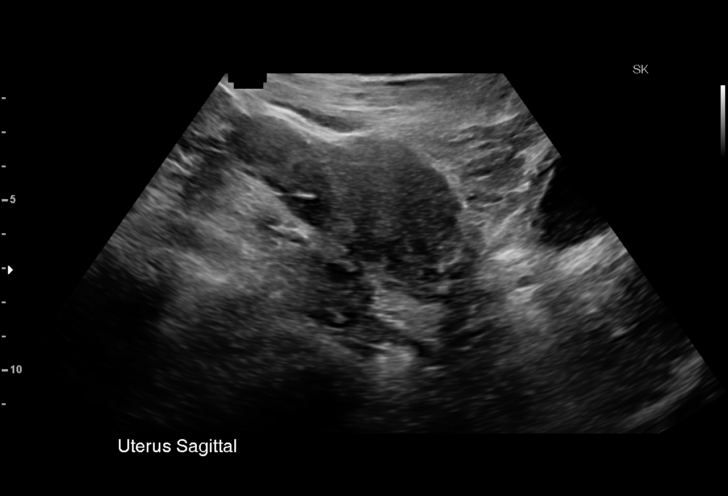
[im 11/49]
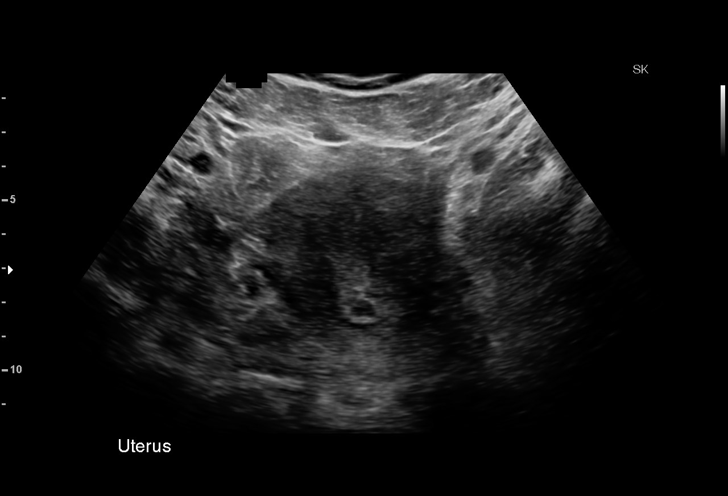
[im 15/49]
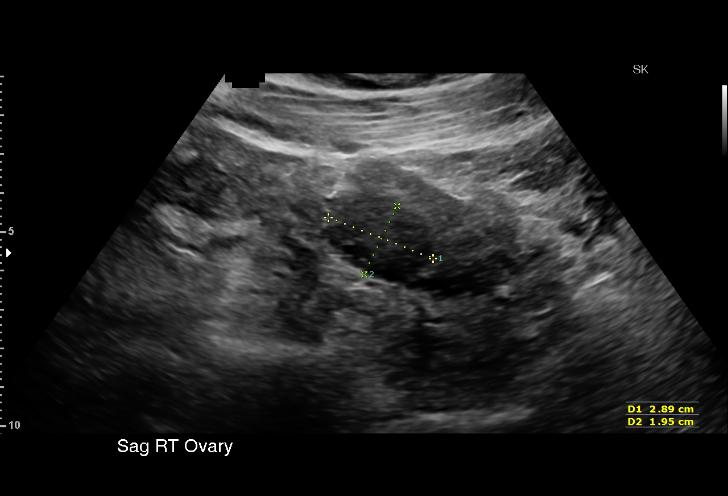
[im 18/49]
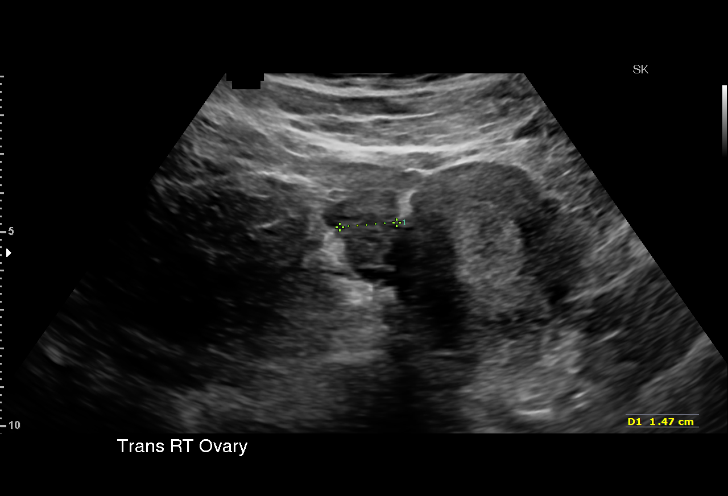
[im 22/49]
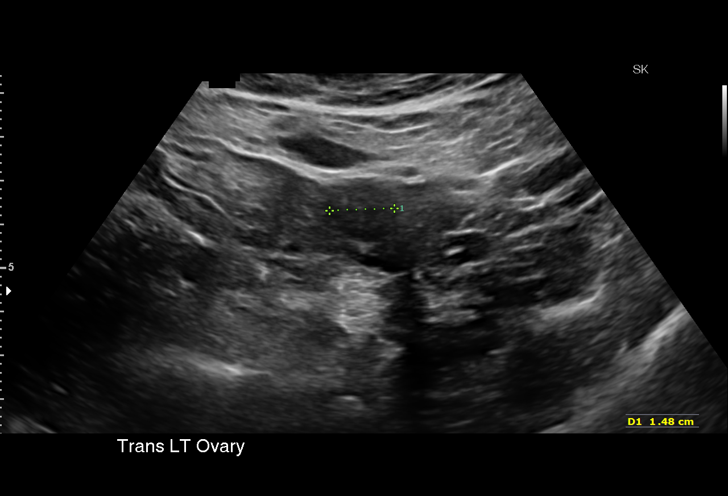
[im 25/49]
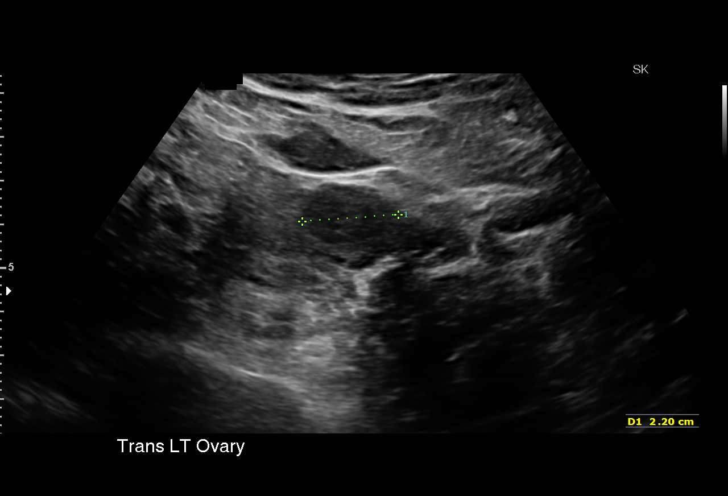
[im 27/49]
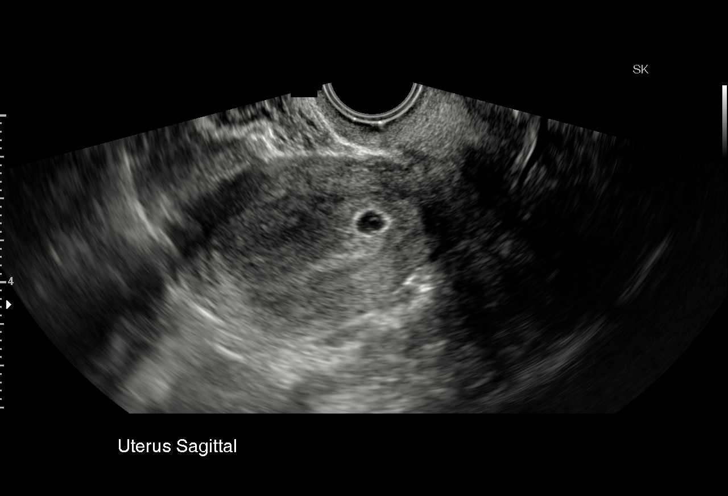
[im 31/49]
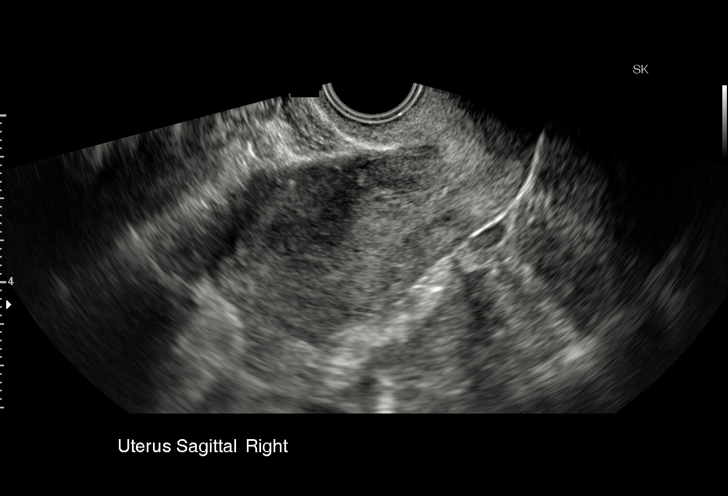
[im 34/49]
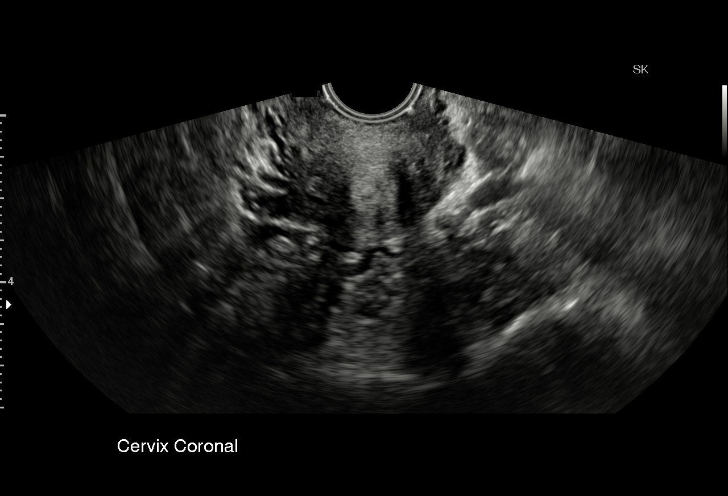
[im 38/49]
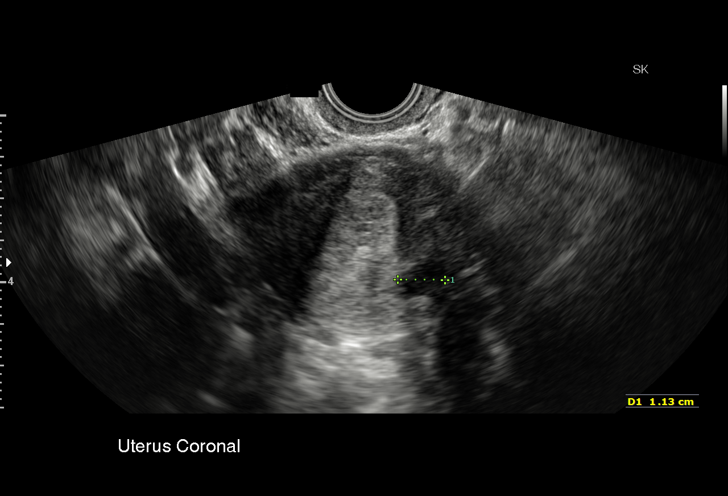
[im 41/49]
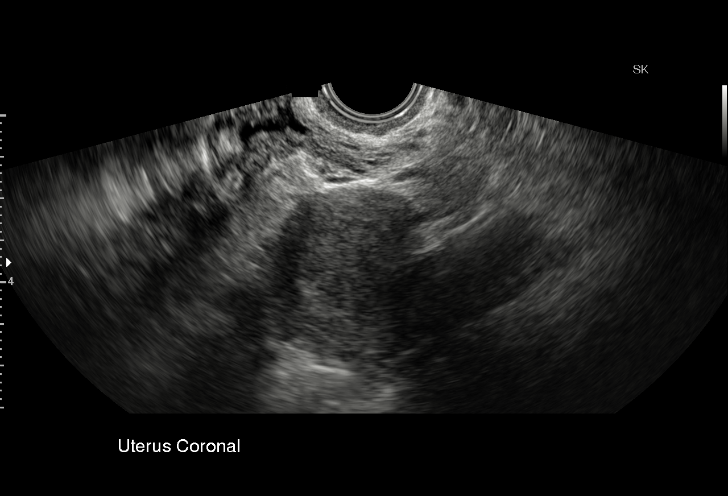
[im 45/49]
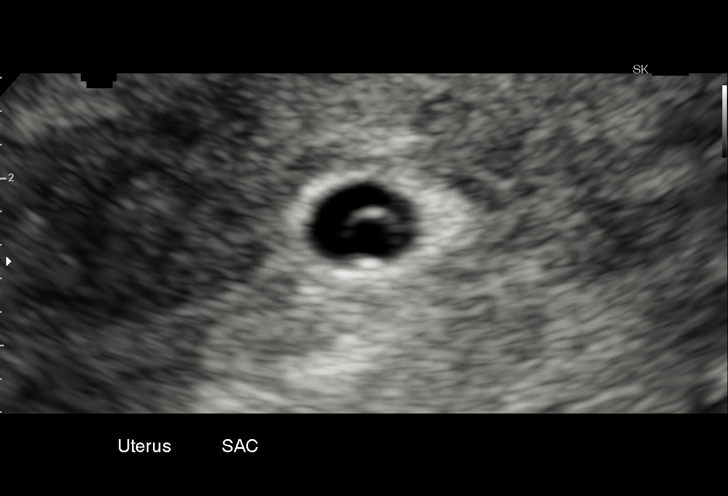
[im 49/49]
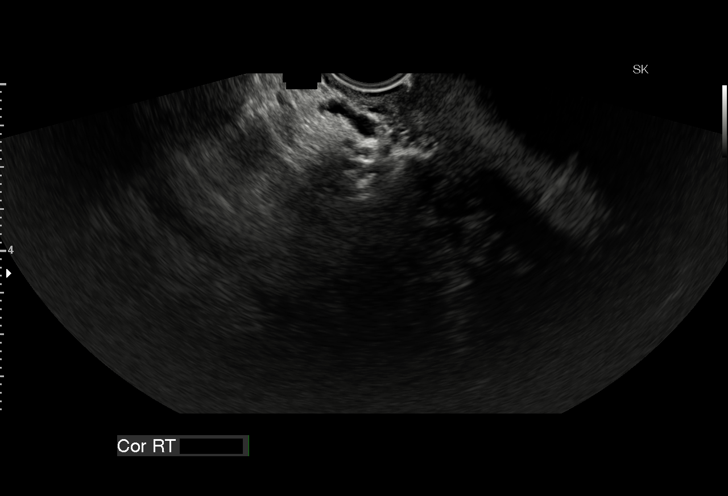

[15 of 28 positions shown; findings below may reference images not displayed]

FINDINGS: Intrauterine gestational sac: Visualized/normal in shape. Located in
lower portion of the endometrial cavity.

Yolk sac:  Visualized

Embryo:  None visualized

MSD: 6  mm   5 w   2  d

Maternal uterus/adnexae: A 1 cm fibroid is seen in the left
posterior uterine corpus. Both ovaries are normal in appearance. No
adnexal mass or free fluid identified.
IMPRESSION: Single 5 week intrauterine gestational sac seen in lower portion of
endometrial cavity. Recommend followup of quantitative HCG levels,
and consider followup ultrasound to assess viability in 10 days.

1 cm intramural fibroid.  No adnexal mass or free fluid identified.

## 2017-02-24 ENCOUNTER — Encounter (HOSPITAL_COMMUNITY): Payer: Self-pay | Admitting: Emergency Medicine

## 2017-02-24 DIAGNOSIS — R102 Pelvic and perineal pain: Secondary | ICD-10-CM | POA: Insufficient documentation

## 2017-02-24 DIAGNOSIS — R1032 Left lower quadrant pain: Secondary | ICD-10-CM | POA: Diagnosis present

## 2017-02-24 LAB — CBC
HCT: 37.5 % (ref 36.0–46.0)
Hemoglobin: 12.1 g/dL (ref 12.0–15.0)
MCH: 28.3 pg (ref 26.0–34.0)
MCHC: 32.3 g/dL (ref 30.0–36.0)
MCV: 87.8 fL (ref 78.0–100.0)
Platelets: 246 10*3/uL (ref 150–400)
RBC: 4.27 MIL/uL (ref 3.87–5.11)
RDW: 14.3 % (ref 11.5–15.5)
WBC: 6.1 10*3/uL (ref 4.0–10.5)

## 2017-02-24 LAB — COMPREHENSIVE METABOLIC PANEL
ALT: 16 U/L (ref 14–54)
AST: 17 U/L (ref 15–41)
Albumin: 3.8 g/dL (ref 3.5–5.0)
Alkaline Phosphatase: 89 U/L (ref 38–126)
Anion gap: 6 (ref 5–15)
BUN: 8 mg/dL (ref 6–20)
CO2: 28 mmol/L (ref 22–32)
Calcium: 9.2 mg/dL (ref 8.9–10.3)
Chloride: 106 mmol/L (ref 101–111)
Creatinine, Ser: 0.86 mg/dL (ref 0.44–1.00)
GFR calc Af Amer: >60 mL/min (ref >60)
GFR calc non Af Amer: >60 mL/min (ref >60)
Glucose, Bld: 90 mg/dL (ref 65–99)
Potassium: 4.1 mmol/L (ref 3.5–5.1)
Sodium: 140 mmol/L (ref 135–145)
Total Bilirubin: 0.7 mg/dL (ref 0.3–1.2)
Total Protein: 7 g/dL (ref 6.5–8.1)

## 2017-02-24 LAB — I-STAT BETA HCG BLOOD, ED (MC, WL, AP ONLY): I-stat hCG, quantitative: <5 m[IU]/mL (ref <5)

## 2017-02-24 LAB — URINALYSIS, ROUTINE W REFLEX MICROSCOPIC
Bilirubin Urine: NEGATIVE
Glucose, UA: NEGATIVE mg/dL
Hgb urine dipstick: NEGATIVE
Ketones, ur: NEGATIVE mg/dL
Leukocytes, UA: NEGATIVE
Nitrite: NEGATIVE
Protein, ur: NEGATIVE mg/dL
Specific Gravity, Urine: 1.026 (ref 1.005–1.030)
pH: 5 (ref 5.0–8.0)

## 2017-02-24 LAB — LIPASE, BLOOD: Lipase: 18 U/L (ref 11–51)

## 2017-02-24 NOTE — ED Triage Notes (Signed)
Patient reports intermittent LLQ abdominal pain  radiating to left groin with occasional nausea and diarrhea onset 2 weeks ago , pt. suspects IUD problem with discomfort /pressure . Denies fever or chills .

## 2017-02-25 ENCOUNTER — Emergency Department (HOSPITAL_COMMUNITY)
Admission: EM | Admit: 2017-02-25 | Discharge: 2017-02-25 | Disposition: A | Discharge status: Home / Self Care | Payer: Medicaid Other | Attending: Emergency Medicine | Admitting: Emergency Medicine

## 2017-02-25 DIAGNOSIS — R102 Pelvic and perineal pain: Secondary | ICD-10-CM

## 2017-02-25 LAB — WET PREP, GENITAL
Sperm: NONE SEEN
Trich, Wet Prep: NONE SEEN
Yeast Wet Prep HPF POC: NONE SEEN

## 2017-02-25 LAB — GC/CHLAMYDIA PROBE AMP (~~LOC~~) NOT AT ARMC
Chlamydia: NEGATIVE
Neisseria Gonorrhea: NEGATIVE

## 2017-02-25 MED ORDER — IBUPROFEN 800 MG PO TABS: 800.0000 mg | ORAL_TABLET | Freq: Three times a day (TID) | ORAL | 0 refills | Status: DC

## 2017-02-25 MED ORDER — TRAMADOL HCL 50 MG PO TABS: 50.0000 mg | ORAL_TABLET | Freq: Four times a day (QID) | ORAL | 0 refills | Status: DC | PRN

## 2017-02-25 NOTE — ED Provider Notes (Signed)
[facility]-EMERGENCY DEPT Provider Note   CSN: [id number] Arrival date & time: 02/24/17  2020    By signing my name below, I, Valentino Saxon, attest that this documentation has been prepared under the direction and in the presence of Gilda Crease, MD. Electronically Signed: Valentino Saxon, ED Scribe. 02/25/17. 1:18 AM.  History   Chief Complaint Chief Complaint  Patient presents with  . Abdominal Pain  . IUD problem   The history is provided by the patient. No language interpreter was used.   HPI Comments: Paula [name] is a 26 y.o. female with no pertinent PMHx who presents to the Emergency Department complaining of 3/10, moderate, constant LLQ abdominal onset two weeks ago. Pt reports associated intermittent nausea accompanied by episodic diarrhea. Pt notes heer abdominal pain began intermittent and has now become constant. She notes having associated intermittent cramps. Pt states when she is on her menstrual cycle, her abdominal pain is more like a pressure-like sensation. She notes her pain is worsened with ambulation and laying down. Per pt, she notes she had her IUD placed in 06/2016. No alleviating factors noted. Pt denies discharge, dysuria, frequency.  Past Medical History:  Diagnosis Date  . Medical history non-contributory     Patient Active Problem List   Diagnosis Date Noted  . Acute respiratory failure (HCC)   . Postpartum complication 06/03/2016  . Cesarean delivery delivered--primary, NRFHR, abruption 05/29/2016  . Active labor at term 05/28/2016    Past Surgical History:  Procedure Laterality Date  . CESAREAN SECTION N/A 05/28/2016   Procedure: CESAREAN SECTION;  Surgeon: Silverio Lay, MD;  Location: [facility] BIRTHING SUITES;  Service: Obstetrics;  Laterality: N/A;  . WISDOM TOOTH EXTRACTION      OB History    Gravida Para Term Preterm AB Living   SAB TAB Ectopic Multiple Live Births         0 1       Home Medications     Prior to Admission medications   Medication Sig Start Date End Date Taking? Authorizing Provider  albuterol (PROVENTIL) (2.5 MG/3ML) 0.083% nebulizer solution Take 3 mLs (2.5 mg total) by nebulization every 4 (four) hours as needed for wheezing or shortness of breath. 06/07/16  Yes Silverio Lay, MD  levonorgestrel (MIRENA) 20 MCG/24HR IUD 1 each by Intrauterine route once.   Yes Historical Provider, MD  ibuprofen (ADVIL,MOTRIN) 800 MG tablet Take 1 tablet (800 mg total) by mouth 3 (three) times daily. 02/25/17   Gilda Crease, MD  traMADol (ULTRAM) 50 MG tablet Take 1 tablet (50 mg total) by mouth every 6 (six) hours as needed. 02/25/17   Gilda Crease, MD    Family History No family history on file.  Social History Social History  Substance Use Topics  . Smoking status: Never Smoker  . Smokeless tobacco: Never Used  . Alcohol use No     Allergies   Patient has no known allergies.   Review of Systems Review of Systems  Gastrointestinal: Positive for abdominal pain, diarrhea and nausea.  Genitourinary: Negative for dysuria, frequency and vaginal discharge.  All other systems reviewed and are negative.    Physical Exam Updated Vital Signs BP 135/77   Pulse 77   Temp 98.8 F (37.1 C) (Oral)   Resp 12   Ht  (1.626 m)   Wt 225 lb (102.1 kg)   LMP 02/03/2017   SpO2 100%   BMI 38.62  kg/m   Physical Exam  Constitutional: She is oriented to person, place, and time. She appears well-developed and well-nourished. No distress.  HENT:  Head: Normocephalic and atraumatic.  Right Ear: Hearing normal.  Left Ear: Hearing normal.  Nose: Nose normal.  Mouth/Throat: Oropharynx is clear and moist and mucous membranes are normal.  Eyes: Conjunctivae and EOM are normal. Pupils are equal, round, and reactive to light.  Neck: Normal range of motion. Neck supple.  Cardiovascular: Regular rhythm, S1 normal and S2 normal.  Exam reveals no gallop and no friction rub.    No murmur heard. Pulmonary/Chest: Effort normal and breath sounds normal. No respiratory distress. She exhibits no tenderness.  Abdominal: Soft. Normal appearance and bowel sounds are normal. There is no hepatosplenomegaly. There is no tenderness. There is no rebound, no guarding, no tenderness at McBurney's point and negative Murphy's sign. No hernia.  Abdomen is normal.   Musculoskeletal: Normal range of motion.  Neurological: She is alert and oriented to person, place, and time. She has normal strength. No cranial nerve deficit or sensory deficit. Coordination normal. GCS eye subscore is 4. GCS verbal subscore is 5. GCS motor subscore is 6.  Skin: Skin is warm, dry and intact. No rash noted. No cyanosis.  Psychiatric: She has a normal mood and affect. Her speech is normal and behavior is normal. Thought content normal.  Nursing note and vitals reviewed.    ED Treatments / Results   DIAGNOSTIC STUDIES: Oxygen Saturation is 100% on RA, normal by my interpretation.    COORDINATION OF CARE: 1:14 AM Discussed treatment plan with pt at bedside which includes labs and pelvic exam and pt agreed to plan.   Labs (all labs ordered are listed, but only abnormal results are displayed) Labs Reviewed  WET PREP, GENITAL  LIPASE, BLOOD  COMPREHENSIVE METABOLIC PANEL  CBC  URINALYSIS, ROUTINE W REFLEX MICROSCOPIC  I-STAT BETA HCG BLOOD, ED ([facility], [facility], [facility] ONLY)  GC/CHLAMYDIA PROBE AMP ([place]) NOT AT [facility]    EKG  EKG Interpretation None       Radiology No results found.  Procedures Procedures (including critical care time)  Medications Ordered in ED Medications - No data to display   Initial Impression / Assessment and Plan / ED Course  I have reviewed the triage vital signs and the nursing notes.  Pertinent labs & imaging results that were available during my care of the patient were reviewed by me and considered in my medical decision making (see chart for details).      Patient presents to the bone spur with complaints of left-sided pelvic cramping. Symptoms have been ongoing for several weeks. It started as intermittent cramping, and the last few days has become more continuous. She is not expressing fever or GI symptoms. Abdominal exam is benign, nontender. Pelvic exam did reveal mild tenderness in the region of the left ovary without any masses. Remainder of examination unremarkable. IUD appears to be in place. Patient safe for treatment with analgesia, follow up with her OB/GYN at [facility] for further management.   Final Clinical Impressions(s) / ED Diagnoses   Final diagnoses:  Pelvic pain in female    New Prescriptions New Prescriptions   IBUPROFEN (ADVIL,MOTRIN) 800 MG TABLET    Take 1 tablet (800 mg total) by mouth 3 (three) times daily.   TRAMADOL (ULTRAM) 50 MG TABLET    Take 1 tablet (50 mg total) by mouth every 6 (six) hours as needed.    I  personally performed the services described in this documentation, which was scribed in my presence. The recorded information has been reviewed and is accurate.     Gilda Crease, MD 02/25/17 [phone]

## 2017-05-07 ENCOUNTER — Emergency Department (HOSPITAL_BASED_OUTPATIENT_CLINIC_OR_DEPARTMENT_OTHER)
Admission: EM | Admit: 2017-05-07 | Discharge: 2017-05-07 | Disposition: A | Discharge status: Home / Self Care | Payer: Medicaid Other | Attending: Emergency Medicine | Admitting: Emergency Medicine

## 2017-05-07 ENCOUNTER — Encounter (HOSPITAL_BASED_OUTPATIENT_CLINIC_OR_DEPARTMENT_OTHER): Payer: Self-pay | Admitting: *Deleted

## 2017-05-07 DIAGNOSIS — R05 Cough: Secondary | ICD-10-CM | POA: Diagnosis present

## 2017-05-07 DIAGNOSIS — J069 Acute upper respiratory infection, unspecified: Secondary | ICD-10-CM | POA: Diagnosis not present

## 2017-05-07 DIAGNOSIS — B9789 Other viral agents as the cause of diseases classified elsewhere: Secondary | ICD-10-CM

## 2017-05-07 MED ORDER — PROMETHAZINE-DM 6.25-15 MG/5ML PO SYRP: 5.0000 mL | ORAL_SOLUTION | Freq: Four times a day (QID) | ORAL | 0 refills | Status: DC | PRN

## 2017-05-07 MED ORDER — PREDNISONE 50 MG PO TABS: 50.0000 mg | ORAL_TABLET | Freq: Every day | ORAL | 0 refills | Status: DC

## 2017-05-07 MED ORDER — GUAIFENESIN ER 1200 MG PO TB12
1.0000 | ORAL_TABLET | Freq: Two times a day (BID) | ORAL | 0 refills | Status: DC
Start: 2017-05-07 — End: 2019-10-23

## 2017-05-07 MED FILL — predniSONE 50 MG TABS: 50 | 5 days supply | Qty: 5 | Fill #0

## 2017-05-07 MED FILL — MUCINEX ER 1,200 MG TABLET: 1200 | 14 days supply | Qty: 28 | Fill #0

## 2017-05-07 MED FILL — PROMETHAZINE-DM SYRUP: 6.25-15 | 6 days supply | Qty: 120 | Fill #0

## 2017-05-07 NOTE — Discharge Instructions (Signed)
Return here as needed.  Increase your fluid intake, rest as much as possible.  Follow-up with a primary care doctor °

## 2017-05-07 NOTE — ED Provider Notes (Signed)
[facility]   CSN: [id number] Arrival date & time: 05/07/17  1452     History   Chief Complaint Chief Complaint  Patient presents with  . URI    HPI Paula [name] is a 26 y.o. female.  HPI Patient presents to the emergency department with cough, runny nose, sore throat over the last 12 days.  The patient states that she has got some hoarseness to her voice as well.  She states an over-the-counter Robitussin without relief of her symptoms.  Patient states that nothing seems make the condition better or worse. The patient denies chest pain, shortness of breath, headache,blurred vision, neck pain, fever,  weakness, numbness, dizziness, anorexia, edema, abdominal pain, nausea, vomiting, diarrhea, rash, back pain, dysuria, hematemesis, bloody stool, near syncope, or syncope. Past Medical History:  Diagnosis Date  . Medical history non-contributory     Patient Active Problem List   Diagnosis Date Noted  . Acute respiratory failure (HCC)   . Postpartum complication 06/03/2016  . Cesarean delivery delivered--primary, NRFHR, abruption 05/29/2016  . Active labor at term 05/28/2016    Past Surgical History:  Procedure Laterality Date  . CESAREAN SECTION N/A 05/28/2016   Procedure: CESAREAN SECTION;  Surgeon: Silverio Lay, MD;  Location: [facility] BIRTHING SUITES;  Service: Obstetrics;  Laterality: N/A;  . WISDOM TOOTH EXTRACTION      OB History    Gravida Para Term Preterm AB Living   1 1 1     1    SAB TAB Ectopic Multiple Live Births         0 1       Home Medications    Prior to Admission medications   Medication Sig Start Date End Date Taking? Authorizing Provider  albuterol (PROVENTIL) (2.5 MG/3ML) 0.083% nebulizer solution Take 3 mLs (2.5 mg total) by nebulization every 4 (four) hours as needed for wheezing or shortness of breath. 06/07/16   Silverio Lay, MD  ibuprofen (ADVIL,MOTRIN) 800 MG tablet Take 1 tablet (800 mg total) by mouth 3  (three) times daily. 02/25/17   Gilda Crease, MD  levonorgestrel (MIRENA) 20 MCG/24HR IUD 1 each by Intrauterine route once.    [provider]  traMADol (ULTRAM) 50 MG tablet Take 1 tablet (50 mg total) by mouth every 6 (six) hours as needed. 02/25/17   Gilda Crease, MD    Family History History reviewed. No pertinent family history.  Social History Social History  Substance Use Topics  . Smoking status: Never Smoker  . Smokeless tobacco: Never Used  . Alcohol use No     Allergies   Patient has no known allergies.   Review of Systems Review of Systems  All other systems negative except as documented in the HPI. All pertinent positives and negatives as reviewed in the HPI. Physical Exam Updated Vital Signs BP 129/71 (BP Location: Left Arm)   Pulse 89   Temp 98.4 F (36.9 C)   Resp 18   Ht 5\' 4"  (1.626 m)   Wt 104.3 kg (230 lb)   LMP 04/05/2017   SpO2 100%   BMI 39.48 kg/m   Physical Exam  Constitutional: She is oriented to person, place, and time. She appears well-developed and well-nourished. No distress.  HENT:  Head: Normocephalic and atraumatic.  Mouth/Throat: Oropharynx is clear and moist.  Eyes: Pupils are equal, round, and reactive to light.  Neck: Normal range of motion. Neck supple.  Cardiovascular: Normal rate, regular rhythm and normal heart sounds.  Exam reveals no gallop and no friction rub.   No murmur heard. Pulmonary/Chest: Effort normal and breath sounds normal. No respiratory distress. She has no wheezes.  Abdominal: Soft. Bowel sounds are normal. She exhibits no distension. There is no tenderness.  Neurological: She is alert and oriented to person, place, and time. She exhibits normal muscle tone. Coordination normal.  Skin: Skin is warm and dry. Capillary refill takes less than 2 seconds. No rash noted. No erythema.  Psychiatric: She has a normal mood and affect. Her behavior is normal.  Nursing note and vitals  reviewed.    ED Treatments / Results  Labs (all labs ordered are listed, but only abnormal results are displayed) Labs Reviewed - No data to display  EKG  EKG Interpretation None       Radiology No results found.  Procedures Procedures (including critical care time)  Medications Ordered in ED Medications - No data to display   Initial Impression / Assessment and Plan / ED Course  I have reviewed the triage vital signs and the nursing notes.  Pertinent labs & imaging results that were available during my care of the patient were reviewed by me and considered in my medical decision making (see chart for details).     Patient retreated for viral URI.  Told to return here as needed.  Patient agrees the plan and all questions were answered  Final Clinical Impressions(s) / ED Diagnoses   Final diagnoses:  None    New Prescriptions New Prescriptions   No medications on file     Charlestine NightLawyer, Karry Causer, Cordelia Poche-C 05/07/17 1537    Rolan BuccoBelfi, Melanie, MD 05/07/17 1744

## 2017-05-07 NOTE — ED Triage Notes (Signed)
Pt c/o URi symptoms x 2 weeks  

## 2018-02-04 ENCOUNTER — Other Ambulatory Visit: Payer: Self-pay

## 2018-02-04 ENCOUNTER — Emergency Department (HOSPITAL_BASED_OUTPATIENT_CLINIC_OR_DEPARTMENT_OTHER): Payer: Medicaid Other

## 2018-02-04 ENCOUNTER — Encounter (HOSPITAL_BASED_OUTPATIENT_CLINIC_OR_DEPARTMENT_OTHER): Payer: Self-pay | Admitting: *Deleted

## 2018-02-04 ENCOUNTER — Emergency Department (HOSPITAL_BASED_OUTPATIENT_CLINIC_OR_DEPARTMENT_OTHER)
Admission: EM | Admit: 2018-02-04 | Discharge: 2018-02-04 | Disposition: A | Discharge status: Home / Self Care | Payer: Medicaid Other | Attending: Emergency Medicine | Admitting: Emergency Medicine

## 2018-02-04 DIAGNOSIS — R69 Illness, unspecified: Secondary | ICD-10-CM

## 2018-02-04 DIAGNOSIS — Z79899 Other long term (current) drug therapy: Secondary | ICD-10-CM | POA: Insufficient documentation

## 2018-02-04 DIAGNOSIS — J111 Influenza due to unidentified influenza virus with other respiratory manifestations: Secondary | ICD-10-CM | POA: Insufficient documentation

## 2018-02-04 MED ORDER — FLUTICASONE PROPIONATE 50 MCG/ACT NA SUSP: 1.0000 | Freq: Every day | NASAL | 2 refills | Status: DC

## 2018-02-04 MED ORDER — BENZONATATE 100 MG PO CAPS: 100.0000 mg | ORAL_CAPSULE | Freq: Three times a day (TID) | ORAL | 0 refills | Status: DC | PRN

## 2018-02-04 MED ORDER — ACETAMINOPHEN 500 MG PO TABS
1000.0000 mg | ORAL_TABLET | Freq: Once | ORAL | Status: AC
  Administered 2018-02-04: 1000 mg via ORAL
  Filled 2018-02-04: qty 2

## 2018-02-04 MED ORDER — IBUPROFEN 800 MG PO TABS: 800.0000 mg | ORAL_TABLET | Freq: Three times a day (TID) | ORAL | 0 refills | Status: DC | PRN

## 2018-02-04 MED FILL — BENZONATATE 100 MG CAPSULE: 100 | 10 days supply | Qty: 30 | Fill #0

## 2018-02-04 MED FILL — IBUPROFEN 800 MG TABLET: 800 | 10 days supply | Qty: 30 | Fill #0

## 2018-02-04 MED FILL — FLUTICASONE PROP 50 MCG SPR: 50 | 60 days supply | Qty: 16 | Fill #0

## 2018-02-04 NOTE — ED Provider Notes (Signed)
[facility] EMERGENCY DEPARTMENT Provider Note   CSN: [id number] Arrival date & time: 02/04/18  1108     History   Chief Complaint Chief Complaint  Patient presents with  . Cough    HPI Paula [name] is a 27 y.o. female without significant past medical hx who presents to the ED with influenza like sxs for the past 36 hours. Patient states she is having congestion, sinus pressure, sore throat, dry cough, nausea, generalized myalgias, chills, and fevers. States temp max of 104 this AM. Patient states she has tried advil last night with minimal improvement, no other alleviating/aggravating factors. No medications today prior to arrival. Patient's daughter was diagnosed with the flu 2 days prior. Denies chest pain, dyspnea, or abdominal pain.     HPI  Past Medical History:  Diagnosis Date  . Medical history non-contributory     Patient Active Problem List   Diagnosis Date Noted  . Acute respiratory failure (HCC)   . Postpartum complication 06/03/2016  . Cesarean delivery delivered--primary, NRFHR, abruption 05/29/2016  . Active labor at term 05/28/2016    Past Surgical History:  Procedure Laterality Date  . CESAREAN SECTION N/A 05/28/2016   Procedure: CESAREAN SECTION;  Surgeon: Silverio Lay, MD;  Location: [facility] BIRTHING SUITES;  Service: Obstetrics;  Laterality: N/A;  . WISDOM TOOTH EXTRACTION      OB History    Gravida Para Term Preterm AB Living   1 1 1     1    SAB TAB Ectopic Multiple Live Births         0 1       Home Medications    Prior to Admission medications   Medication Sig Start Date End Date Taking? Authorizing Provider  albuterol (PROVENTIL) (2.5 MG/3ML) 0.083% nebulizer solution Take 3 mLs (2.5 mg total) by nebulization every 4 (four) hours as needed for wheezing or shortness of breath. 06/07/16   Silverio Lay, MD  Guaifenesin 1200 MG TB12 Take 1 tablet (1,200 mg total) by mouth 2 (two) times daily. 05/07/17   Lawyer, Cristal Deer,  PA-C  ibuprofen (ADVIL,MOTRIN) 800 MG tablet Take 1 tablet (800 mg total) by mouth 3 (three) times daily. 02/25/17   Gilda Crease, MD  levonorgestrel (MIRENA) 20 MCG/24HR IUD 1 each by Intrauterine route once.    [provider]  predniSONE (DELTASONE) 50 MG tablet Take 1 tablet (50 mg total) by mouth daily with breakfast. 05/07/17   Lawyer, Cristal Deer, PA-C  promethazine-dextromethorphan (PROMETHAZINE-DM) 6.25-15 MG/5ML syrup Take 5 mLs by mouth 4 (four) times daily as needed for cough. 05/07/17   Lawyer, Cristal Deer, PA-C  traMADol (ULTRAM) 50 MG tablet Take 1 tablet (50 mg total) by mouth every 6 (six) hours as needed. 02/25/17   Gilda Crease, MD    Family History History reviewed. No pertinent family history.  Social History Social History   Tobacco Use  . Smoking status: Never Smoker  . Smokeless tobacco: Never Used  Substance Use Topics  . Alcohol use: No  . Drug use: No     Allergies   Patient has no known allergies.   Review of Systems Review of Systems  Constitutional: Positive for chills and fever.  HENT: Positive for sinus pressure and sore throat. Negative for ear pain.   Respiratory: Positive for cough. Negative for shortness of breath.   Cardiovascular: Negative for chest pain.  Gastrointestinal: Positive for nausea. Negative for abdominal pain, blood in stool, diarrhea and vomiting.  Genitourinary: Negative for dysuria and  frequency.  Musculoskeletal: Positive for myalgias (generalized).     Physical Exam Updated Vital Signs BP 117/80 (BP Location: Left Arm)   Pulse (!) 106   Temp (!) 102.9 F (39.4 C) (Oral)   Resp 20   Ht 5\' 4"  (1.626 m)   Wt 102.1 kg (225 lb)   LMP 01/20/2018   SpO2 100%   BMI 38.62 kg/m   Physical Exam  Constitutional: She appears well-developed and well-nourished.  Non-toxic appearance. No distress.  HENT:  Head: Normocephalic and atraumatic.  Right Ear: Tympanic membrane is not perforated, not  erythematous, not retracted and not bulging.  Left Ear: Tympanic membrane is not perforated, not erythematous, not retracted and not bulging.  Nose: Mucosal edema present. Right sinus exhibits no maxillary sinus tenderness and no frontal sinus tenderness. Left sinus exhibits no maxillary sinus tenderness and no frontal sinus tenderness.  Mouth/Throat: Uvula is midline and oropharynx is clear and moist. No oropharyngeal exudate or posterior oropharyngeal erythema.  Eyes: Conjunctivae are normal. Pupils are equal, round, and reactive to light. Right eye exhibits no discharge. Left eye exhibits no discharge.  Neck: Normal range of motion. Neck supple.  Cardiovascular: Normal rate and regular rhythm.  No murmur heard. Pulmonary/Chest: Breath sounds normal. No respiratory distress. She has no wheezes. She has no rales.  Abdominal: Soft. She exhibits no distension. There is no tenderness.  Lymphadenopathy:    She has no cervical adenopathy.  Neurological: She is alert.  Skin: Skin is warm and dry. No rash noted.  Psychiatric: She has a normal mood and affect. Her behavior is normal.  Nursing note and vitals reviewed.    ED Treatments / Results  Labs (all labs ordered are listed, but only abnormal results are displayed) Labs Reviewed - No data to display  EKG  EKG Interpretation None       Radiology Dg Chest 2 View  Result Date: 02/04/2018 CLINICAL DATA:  Nonproductive cough and fever for 1 day. EXAM: CHEST - 2 VIEW COMPARISON:  PA and lateral chest 06/06/2016. FINDINGS: The lungs are clear. Heart size is normal. No pneumothorax or pleural effusion. No bony abnormality. IMPRESSION: Negative chest. Electronically Signed   By: Drusilla Kannerhomas  Dalessio M.D.   On: 02/04/2018 12:16    Procedures Procedures (including critical care time)  Medications Ordered in ED Medications  acetaminophen (TYLENOL) tablet 1,000 mg (1,000 mg Oral Given 02/04/18 1141)     Initial Impression / Assessment and  Plan / ED Course  I have reviewed the triage vital signs and the nursing notes.  Pertinent labs & imaging results that were available during my care of the patient were reviewed by me and considered in my medical decision making (see chart for details).    Patient presents with symptoms consistent with influenza like illness. She is nontoxic appearing, initially febrile and tachycardic- improved following Tylenol at time of discharge. Patient is without adventitious sounds on lung exam, CXR negative for infiltrate, doubt PNA. No wheezing on exam. No sinus tenderness, doubt sinusitis. Centor score 1 , doubt strep pharyngitis. Suspect influenza like illness- discussed risks/benefits of Tamiflu, patient declined Tamiflu which I am in agreement with. Will treat supportively with Ibuprofen, Flonase, and Tessalon. I discussed results, treatment plan, need for PCP follow-up, and return precautions with the patient. Provided opportunity for questions, patient confirmed understanding and is in agreement with plan.   Vitals:   02/04/18 1134 02/04/18 1304  BP: 117/80 111/69  Pulse: (!) 106 96  Resp: 20 18  Temp: (!) 102.9 F (39.4 C) 100.1 F (37.8 C)  SpO2: 100% 100%     Final Clinical Impressions(s) / ED Diagnoses   Final diagnoses:  Influenza-like illness    ED Discharge Orders        Ordered    ibuprofen (ADVIL,MOTRIN) 800 MG tablet  Every 8 hours PRN     02/04/18 1232    benzonatate (TESSALON) 100 MG capsule  3 times daily PRN     02/04/18 1232    fluticasone (FLONASE) 50 MCG/ACT nasal spray  Daily     02/04/18 [address] PA-C 02/04/18 1310    Benjiman Core, MD 02/04/18 1641

## 2018-02-04 NOTE — ED Triage Notes (Signed)
Pt reports cough and fevers up to 103, daughter dx with flu Monday.

## 2018-02-04 NOTE — Discharge Instructions (Signed)
You were seen in the emergency today and diagnosed with an influenza like- illness. Your chest xray did not show findings of pneumonia.  I have prescribed you multiple medications to treat your symptoms.   -Flonase to be used 1 spray in each nostril daily.  This medication is used to treat your congestion.  -Tessalon can be taken once every 8 hours as needed.  This medication is used to treat your cough.  -Ibuprofen to be taken once every 8 hours as needed for pain.You may supplement with tylenol per over the counter bottle dosing instructions.   You will need to follow-up with your primary care provider in 1 week if your symptoms have not improved.  If you do not have a primary care provider one is provided in your discharge instructions.  Return to the emergency department for any new or worsening symptoms including but not limited to persistent fever not improved with motrin/tylenol, inability to keep fluids, down, abdominal pain, difficulty breathing, chest pain, or passing out.

## 2018-02-11 ENCOUNTER — Emergency Department (HOSPITAL_BASED_OUTPATIENT_CLINIC_OR_DEPARTMENT_OTHER): Payer: Self-pay

## 2018-02-11 ENCOUNTER — Other Ambulatory Visit: Payer: Self-pay

## 2018-02-11 ENCOUNTER — Encounter (HOSPITAL_BASED_OUTPATIENT_CLINIC_OR_DEPARTMENT_OTHER): Payer: Self-pay

## 2018-02-11 ENCOUNTER — Emergency Department (HOSPITAL_BASED_OUTPATIENT_CLINIC_OR_DEPARTMENT_OTHER)
Admission: EM | Admit: 2018-02-11 | Discharge: 2018-02-11 | Disposition: A | Discharge status: Home / Self Care | Payer: Self-pay | Attending: Emergency Medicine | Admitting: Emergency Medicine

## 2018-02-11 DIAGNOSIS — J029 Acute pharyngitis, unspecified: Secondary | ICD-10-CM | POA: Insufficient documentation

## 2018-02-11 DIAGNOSIS — R059 Cough, unspecified: Secondary | ICD-10-CM

## 2018-02-11 DIAGNOSIS — R05 Cough: Secondary | ICD-10-CM

## 2018-02-11 DIAGNOSIS — Z79899 Other long term (current) drug therapy: Secondary | ICD-10-CM | POA: Insufficient documentation

## 2018-02-11 LAB — RAPID STREP SCREEN (MED CTR MEBANE ONLY): Streptococcus, Group A Screen (Direct): NEGATIVE

## 2018-02-11 MED ORDER — DEXAMETHASONE SODIUM PHOSPHATE 10 MG/ML IJ SOLN
10.0000 mg | Freq: Once | INTRAMUSCULAR | Status: AC
  Administered 2018-02-11: 10 mg
  Filled 2018-02-11: qty 1

## 2018-02-11 MED ORDER — AMOXICILLIN 500 MG PO CAPS: 500.0000 mg | ORAL_CAPSULE | Freq: Three times a day (TID) | ORAL | 0 refills | Status: DC

## 2018-02-11 MED ORDER — LIDOCAINE VISCOUS 2 % MT SOLN: 20.0000 mL | OROMUCOSAL | 0 refills | Status: DC | PRN

## 2018-02-11 MED FILL — LIDOCAINE 2% VISCOUS SOLN: 2 | 5 days supply | Qty: 100 | Fill #0

## 2018-02-11 MED FILL — AMOXICILLIN 500 MG CAPSULE: 500 | 7 days supply | Qty: 21 | Fill #0

## 2018-02-11 NOTE — ED Triage Notes (Signed)
C/o flu like sx x 8 days-states she was seen here and dx with flu last week-states she has cont'd to have sore throat and cough-NAD-steady gait

## 2018-02-11 NOTE — ED Provider Notes (Signed)
[facility] EMERGENCY DEPARTMENT Provider Note   CSN: [id number] Arrival date & time: 02/11/18  1219     History   Chief Complaint Chief Complaint  Patient presents with  . Cough    HPI Paula [name] is a 27 y.o. female.  HPI 27 year old AA female no pertinent past medical history presents to the emergency department today for evaluation of ongoing cough and sore throat.  Patient states that she was seen last week on 3/13 in the ED for evaluation of flulike symptoms after her daughter was diagnosed with the flu.  Patient states that she did not have a flu test performed but she was diagnosed with possible flu.  Patient states that the sore throat and the cough has persisted.  She is tried over-the-counter cold and flu medication along with Tessalon with little relief.  Patient states that it hurts to swallow.  She reports a productive cough.  Denies any associated chest pain or shortness of breath.  She does report some left ear fullness.  Does report nasal congestion and sinus pressure.  Patient is concerned she may have strep throat.  States that not tested for strep last time.  She states that her fevers and chills along with body aches have improved. Past Medical History:  Diagnosis Date  . Medical history non-contributory     Patient Active Problem List   Diagnosis Date Noted  . Acute respiratory failure (HCC)   . Postpartum complication 06/03/2016  . Cesarean delivery delivered--primary, NRFHR, abruption 05/29/2016  . Active labor at term 05/28/2016    Past Surgical History:  Procedure Laterality Date  . CESAREAN SECTION N/A 05/28/2016   Procedure: CESAREAN SECTION;  Surgeon: Silverio Lay, MD;  Location: [facility] BIRTHING SUITES;  Service: Obstetrics;  Laterality: N/A;  . WISDOM TOOTH EXTRACTION      OB History    Gravida Para Term Preterm AB Living   1 1 1     1    SAB TAB Ectopic Multiple Live Births         0 1       Home Medications    Prior  to Admission medications   Medication Sig Start Date End Date Taking? Authorizing Provider  albuterol (PROVENTIL) (2.5 MG/3ML) 0.083% nebulizer solution Take 3 mLs (2.5 mg total) by nebulization every 4 (four) hours as needed for wheezing or shortness of breath. 06/07/16   Silverio Lay, MD  amoxicillin (AMOXIL) 500 MG capsule Take 1 capsule (500 mg total) by mouth 3 (three) times daily. 02/11/18   Rise Mu, PA-C  benzonatate (TESSALON) 100 MG capsule Take 1 capsule (100 mg total) by mouth 3 (three) times daily as needed for cough. 02/04/18   Petrucelli, Samantha R, PA-C  fluticasone (FLONASE) 50 MCG/ACT nasal spray Place 1 spray into both nostrils daily. 02/04/18   Petrucelli, Samantha R, PA-C  Guaifenesin 1200 MG TB12 Take 1 tablet (1,200 mg total) by mouth 2 (two) times daily. 05/07/17   Lawyer, Cristal Deer, PA-C  ibuprofen (ADVIL,MOTRIN) 800 MG tablet Take 1 tablet (800 mg total) by mouth every 8 (eight) hours as needed for fever or moderate pain. 02/04/18   Petrucelli, Pleas Koch, PA-C  levonorgestrel (MIRENA) 20 MCG/24HR IUD 1 each by Intrauterine route once.    [provider]  lidocaine (XYLOCAINE) 2 % solution Use as directed 20 mLs in the mouth or throat as needed for mouth pain. 02/11/18   Rise Mu, PA-C  predniSONE (DELTASONE) 50 MG tablet Take 1 tablet (  50 mg total) by mouth daily with breakfast. 05/07/17   Lawyer, Cristal Deerhristopher, PA-C  promethazine-dextromethorphan (PROMETHAZINE-DM) 6.25-15 MG/5ML syrup Take 5 mLs by mouth 4 (four) times daily as needed for cough. 05/07/17   Lawyer, Cristal Deerhristopher, PA-C  traMADol (ULTRAM) 50 MG tablet Take 1 tablet (50 mg total) by mouth every 6 (six) hours as needed. 02/25/17   Gilda CreasePollina, Christopher J, MD    Family History No family history on file.  Social History Social History   Tobacco Use  . Smoking status: Never Smoker  . Smokeless tobacco: Never Used  Substance Use Topics  . Alcohol use: No  . Drug use: No      Allergies   Patient has no known allergies.   Review of Systems Review of Systems  All other systems reviewed and are negative.    Physical Exam Updated Vital Signs BP 133/79 (BP Location: Left Arm)   Pulse 80   Temp 98.4 F (36.9 C) (Oral)   Resp 18   Ht 5\' 4"  (1.626 m)   Wt 97.5 kg (215 lb)   LMP 01/20/2018   SpO2 96%   BMI 36.90 kg/m   Physical Exam  Constitutional: She appears well-developed and well-nourished. No distress.  HENT:  Head: Normocephalic and atraumatic.  Right Ear: Hearing, tympanic membrane, external ear and ear canal normal.  Left Ear: External ear and ear canal normal. Tympanic membrane is erythematous.  Nose: Mucosal edema and rhinorrhea present. Right sinus exhibits maxillary sinus tenderness and frontal sinus tenderness. Left sinus exhibits maxillary sinus tenderness and frontal sinus tenderness.  Mouth/Throat: Uvula is midline and mucous membranes are normal. No trismus in the jaw. No uvula swelling. Oropharyngeal exudate, posterior oropharyngeal edema and posterior oropharyngeal erythema present. No tonsillar abscesses. Tonsils are 2+ on the right. Tonsils are 2+ on the left. No tonsillar exudate.  Managing secretions tolerating airway.  Eyes: Conjunctivae are normal. Right eye exhibits no discharge. Left eye exhibits no discharge. No scleral icterus.  Neck: Normal range of motion. Neck supple.  No c spine midline tenderness. No paraspinal tenderness. No deformities or step offs noted. Full ROM. Supple. No nuchal rigidity.    Pulmonary/Chest: Effort normal and breath sounds normal. No stridor. No respiratory distress. She has no wheezes. She has no rales. She exhibits no tenderness.  Musculoskeletal: Normal range of motion.  Lymphadenopathy:    She has no cervical adenopathy.  Neurological: She is alert.  Skin: Skin is warm and dry. Capillary refill takes less than 2 seconds. No pallor.  Psychiatric: Her behavior is normal. Judgment and  thought content normal.  Nursing note and vitals reviewed.    ED Treatments / Results  Labs (all labs ordered are listed, but only abnormal results are displayed) Labs Reviewed  RAPID STREP SCREEN (NOT AT [facility])  CULTURE, GROUP A STREP [facility])    EKG  EKG Interpretation None       Radiology Dg Chest 2 View  Result Date: 02/11/2018 CLINICAL DATA:  Cough.  Recent flu EXAM: CHEST - 2 VIEW COMPARISON:  02/04/2018 FINDINGS: The heart size and mediastinal contours are within normal limits. Both lungs are clear. The visualized skeletal structures are unremarkable. IMPRESSION: No active cardiopulmonary disease. Electronically Signed   By: Marlan Palauharles  Clark M.D.   On: 02/11/2018 13:05    Procedures Procedures (including critical care time)  Medications Ordered in ED Medications  dexamethasone (DECADRON) injection 10 mg (10 mg Other Given 02/11/18 1259)     Initial Impression / Assessment and Plan /  ED Course  I have reviewed the triage vital signs and the nursing notes.  Pertinent labs & imaging results that were available during my care of the patient were reviewed by me and considered in my medical decision making (see chart for details).     Patient presents to the ED for ongoing sore throat and cough and being diagnosed with the flu 8 days ago.  Patient states that her sore throat is persisting.  Also reports a productive cough.  Patient denies any associated fevers at this time he does report sinus pressure and congestion with left ear fullness.  Overall well-appearing and nontoxic.  Vital signs are reassuring.  Patient afebrile in the ED today.  On exam patient does have some erythema of the left TM.  No signs of otitis media at this time.  She does have sinus pressure.  Pharynx with edema and exudate.  Managing secretions tolerating her airway.  No signs of deep neck infection or peritonsillar abscess.  No nuchal rigidity.  Lungs are clear to auscultation bilaterally.  Chest  x-ray performed to rule out post influenza pneumonia.  Chest x-ray is reviewed by myself shows no signs of focal infiltrate.  Her strep test was negative however given the prolonged symptoms with the physical exam will treat with amoxicillin.  Have given viscous lidocaine.  Discussed symptom medic treatment at home and follow-up with her primary care doctor.  Pt is hemodynamically stable, in NAD, & able to ambulate in the ED. Evaluation does not show pathology that would require ongoing emergent intervention or inpatient treatment. I explained the diagnosis to the patient. Pain has been managed & has no complaints prior to dc. Pt is comfortable with above plan and is stable for discharge at this time. All questions were answered prior to disposition. Strict return precautions for f/u to the ED were discussed. Encouraged follow up with PCP.   Final Clinical Impressions(s) / ED Diagnoses   Final diagnoses:  Cough  Sore throat    ED Discharge Orders        Ordered    amoxicillin (AMOXIL) 500 MG capsule  3 times daily     02/11/18 1344    lidocaine (XYLOCAINE) 2 % solution  As needed     02/11/18 1344       Rise Mu, PA-C 02/11/18 1354    Vanetta Mulders, MD 02/13/18 [phone]

## 2018-02-11 NOTE — Discharge Instructions (Signed)
Your strep test was negative however given the appearance of your throat and the ongoing symptoms for greater than 7 days we will treat you with amoxicillin.  Also given you lidocaine rinse to numb your throat.  Motrin and Tylenol to help with fevers and swelling along with pain.  Continue the Tessalon for cough.  Continue over-the-counter decongestants such as Sudafed or Mucinex.  Follow-up with your primary care doctor symptoms not improving next week or so return to ED with any worsening symptoms.

## 2018-02-14 LAB — CULTURE, GROUP A STREP (THRC)

## 2018-08-21 ENCOUNTER — Other Ambulatory Visit: Payer: Self-pay

## 2018-08-21 ENCOUNTER — Emergency Department (HOSPITAL_BASED_OUTPATIENT_CLINIC_OR_DEPARTMENT_OTHER)
Admission: EM | Admit: 2018-08-21 | Discharge: 2018-08-21 | Disposition: A | Discharge status: Home / Self Care | Payer: Medicaid Other | Attending: Emergency Medicine | Admitting: Emergency Medicine

## 2018-08-21 ENCOUNTER — Encounter (HOSPITAL_BASED_OUTPATIENT_CLINIC_OR_DEPARTMENT_OTHER): Payer: Self-pay | Admitting: Emergency Medicine

## 2018-08-21 DIAGNOSIS — Z79899 Other long term (current) drug therapy: Secondary | ICD-10-CM | POA: Insufficient documentation

## 2018-08-21 DIAGNOSIS — H1033 Unspecified acute conjunctivitis, bilateral: Secondary | ICD-10-CM | POA: Insufficient documentation

## 2018-08-21 DIAGNOSIS — H109 Unspecified conjunctivitis: Secondary | ICD-10-CM

## 2018-08-21 MED ORDER — FLUORESCEIN SODIUM 1 MG OP STRP
1.0000 | ORAL_STRIP | Freq: Once | OPHTHALMIC | Status: AC
  Administered 2018-08-21: 1 via OPHTHALMIC
  Filled 2018-08-21: qty 1

## 2018-08-21 MED ORDER — CETIRIZINE HCL 10 MG PO TABS: 10.0000 mg | ORAL_TABLET | Freq: Every day | ORAL | 0 refills | Status: DC

## 2018-08-21 MED ORDER — POLYMYXIN B-TRIMETHOPRIM 10000-0.1 UNIT/ML-% OP SOLN: 2.0000 [drp] | OPHTHALMIC | 0 refills | Status: AC

## 2018-08-21 NOTE — Discharge Instructions (Addendum)
Use 2 Polytrim drops every 4-6 hours for 7 days.  Take Zyrtec once daily.  Use cool compresses to your eyes few times daily as well.  Please follow-up with the ophthalmologist if your symptoms are not improving over the next few days.  Please return the emergency department if you develop any new or worsening symptoms.

## 2018-08-21 NOTE — ED Provider Notes (Signed)
[facility] EMERGENCY DEPARTMENT Provider Note   CSN: [id number] Arrival date & time: 08/21/18  4782     History   Chief Complaint Chief Complaint  Patient presents with  . Eye Drainage    HPI Paula [name] is a 27 y.o. female who is previously healthy who presents with a 5-day history of bilateral eye irritation, redness, and drainage.  Patient reports it started off just in her left eye, but now her right eye is affected as well.  She denies any vision changes.  She denies any significant pain, but does notice it does not feel like it usually does.  She does not wear glasses or contacts.  She has no known sick contacts, however she does have kids in daycare.  She has not tried anything at home for symptoms.  She has had continuous watering and has been waking up with her eyes crusted over.  HPI  Past Medical History:  Diagnosis Date  . Medical history non-contributory     Patient Active Problem List   Diagnosis Date Noted  . Acute respiratory failure (HCC)   . Postpartum complication 06/03/2016  . Cesarean delivery delivered--primary, NRFHR, abruption 05/29/2016  . Active labor at term 05/28/2016    Past Surgical History:  Procedure Laterality Date  . CESAREAN SECTION N/A 05/28/2016   Procedure: CESAREAN SECTION;  Surgeon: Silverio Lay, MD;  Location: [facility] BIRTHING SUITES;  Service: Obstetrics;  Laterality: N/A;  . WISDOM TOOTH EXTRACTION       OB History    Gravida  1   Para  1   Term  1   Preterm      AB      Living  1     SAB      TAB      Ectopic      Multiple  0   Live Births  1            Home Medications    Prior to Admission medications   Medication Sig Start Date End Date Taking? Authorizing Provider  albuterol (PROVENTIL) (2.5 MG/3ML) 0.083% nebulizer solution Take 3 mLs (2.5 mg total) by nebulization every 4 (four) hours as needed for wheezing or shortness of breath. 06/07/16   Silverio Lay, MD  amoxicillin  (AMOXIL) 500 MG capsule Take 1 capsule (500 mg total) by mouth 3 (three) times daily. 02/11/18   Rise Mu, PA-C  benzonatate (TESSALON) 100 MG capsule Take 1 capsule (100 mg total) by mouth 3 (three) times daily as needed for cough. 02/04/18   Petrucelli, Lelon Mast R, PA-C  cetirizine (ZYRTEC ALLERGY) 10 MG tablet Take 1 tablet (10 mg total) by mouth daily. 08/21/18   Arisbel Maione, Waylan Boga, PA-C  fluticasone (FLONASE) 50 MCG/ACT nasal spray Place 1 spray into both nostrils daily. 02/04/18   Petrucelli, Samantha R, PA-C  Guaifenesin 1200 MG TB12 Take 1 tablet (1,200 mg total) by mouth 2 (two) times daily. 05/07/17   Lawyer, Cristal Deer, PA-C  ibuprofen (ADVIL,MOTRIN) 800 MG tablet Take 1 tablet (800 mg total) by mouth every 8 (eight) hours as needed for fever or moderate pain. 02/04/18   Petrucelli, Pleas Koch, PA-C  levonorgestrel (MIRENA) 20 MCG/24HR IUD 1 each by Intrauterine route once.    [provider]  lidocaine (XYLOCAINE) 2 % solution Use as directed 20 mLs in the mouth or throat as needed for mouth pain. 02/11/18   Rise Mu, PA-C  predniSONE (DELTASONE) 50 MG tablet Take 1 tablet (50  mg total) by mouth daily with breakfast. 05/07/17   Lawyer, Cristal Deer, PA-C  promethazine-dextromethorphan (PROMETHAZINE-DM) 6.25-15 MG/5ML syrup Take 5 mLs by mouth 4 (four) times daily as needed for cough. 05/07/17   Lawyer, Cristal Deer, PA-C  traMADol (ULTRAM) 50 MG tablet Take 1 tablet (50 mg total) by mouth every 6 (six) hours as needed. 02/25/17   Gilda Crease, MD  trimethoprim-polymyxin b (POLYTRIM) ophthalmic solution Place 2 drops into both eyes every 4 (four) hours for 7 days. 08/21/18 08/28/18  Emi Holes, PA-C    Family History History reviewed. No pertinent family history.  Social History Social History   Tobacco Use  . Smoking status: Never Smoker  . Smokeless tobacco: Never Used  Substance Use Topics  . Alcohol use: No  . Drug use: No     Allergies     Patient has no known allergies.   Review of Systems Review of Systems  Constitutional: Negative for fever.  HENT: Negative for congestion, ear pain and sore throat.   Eyes: Positive for discharge, redness and itching. Negative for photophobia and visual disturbance.  Respiratory: Negative for cough.   Neurological: Positive for headaches.     Physical Exam Updated Vital Signs BP 133/77   Pulse 84   Temp 98.5 F (36.9 C) (Oral)   Resp 18   Ht 5\' 4"  (1.626 m)   Wt 101.6 kg   LMP 07/23/2018 (Exact Date)   SpO2 100%   BMI 38.45 kg/m   Physical Exam  Constitutional: She appears well-developed and well-nourished. No distress.  HENT:  Head: Normocephalic and atraumatic.  Mouth/Throat: Oropharynx is clear and moist. No oropharyngeal exudate.  Eyes: Pupils are equal, round, and reactive to light. EOM and lids are normal. Lids are everted and swept, no foreign bodies found. Right eye exhibits no discharge. Left eye exhibits no discharge. Right conjunctiva is injected. Left conjunctiva is injected. No scleral icterus. Right eye exhibits normal extraocular motion. Left eye exhibits normal extraocular motion.  No uptake on fluorescein staining bilaterally No consensual photophobia  Neck: Normal range of motion. Neck supple. No thyromegaly present.  Cardiovascular: Normal rate, regular rhythm, normal heart sounds and intact distal pulses. Exam reveals no gallop and no friction rub.  No murmur heard. Pulmonary/Chest: Effort normal and breath sounds normal. No stridor. No respiratory distress. She has no wheezes. She has no rales.  Abdominal: Soft. Bowel sounds are normal. She exhibits no distension. There is no tenderness. There is no rebound and no guarding.  Musculoskeletal: She exhibits no edema.  Lymphadenopathy:    She has no cervical adenopathy.  Neurological: She is alert. Coordination normal.  Skin: Skin is warm and dry. No rash noted. She is not diaphoretic. No pallor.   Psychiatric: She has a normal mood and affect.  Nursing note and vitals reviewed.    ED Treatments / Results  Labs (all labs ordered are listed, but only abnormal results are displayed) Labs Reviewed - No data to display  EKG None  Radiology No results found.  Procedures Procedures (including critical care time)  Medications Ordered in ED Medications  fluorescein ophthalmic strip 1 strip (1 strip Both Eyes Given 08/21/18 1105)     Initial Impression / Assessment and Plan / ED Course  I have reviewed the triage vital signs and the nursing notes.  Pertinent labs & imaging results that were available during my care of the patient were reviewed by me and considered in my medical decision making (see chart for details).  Patient with probable viral versus allergic conjunctivitis, however will cover with antibiotics.  We will also discharge home with Zyrtec, as patient does have a history of seasonal allergies.  There are no signs of corneal abrasion, hyphema, uveitis or iritis.  Patient will be given ophthalmology follow-up.  Return precautions discussed.  Patient understands and agrees with plan.  Patient vitals stable throughout ED course and discharged in satisfactory condition.  Final Clinical Impressions(s) / ED Diagnoses   Final diagnoses:  Conjunctivitis of both eyes, unspecified conjunctivitis type    ED Discharge Orders         Ordered    trimethoprim-polymyxin b (POLYTRIM) ophthalmic solution  Every 4 hours     08/21/18 1129    cetirizine (ZYRTEC ALLERGY) 10 MG tablet  Daily     08/21/18 1135           Emi Holes, PA-C 08/21/18 1135    Gwyneth Sprout, MD 08/21/18 1400

## 2018-08-21 NOTE — ED Triage Notes (Signed)
Bilateral eye erythema, drainage since Sunday.

## 2018-11-23 ENCOUNTER — Encounter (HOSPITAL_BASED_OUTPATIENT_CLINIC_OR_DEPARTMENT_OTHER): Payer: Self-pay | Admitting: *Deleted

## 2018-11-23 ENCOUNTER — Emergency Department (HOSPITAL_BASED_OUTPATIENT_CLINIC_OR_DEPARTMENT_OTHER)
Admission: EM | Admit: 2018-11-23 | Discharge: 2018-11-23 | Disposition: A | Discharge status: Home / Self Care | Payer: Medicaid Other | Attending: Emergency Medicine | Admitting: Emergency Medicine

## 2018-11-23 ENCOUNTER — Other Ambulatory Visit: Payer: Self-pay

## 2018-11-23 DIAGNOSIS — B349 Viral infection, unspecified: Secondary | ICD-10-CM | POA: Insufficient documentation

## 2018-11-23 MED ORDER — BENZONATATE 100 MG PO CAPS: 100.0000 mg | ORAL_CAPSULE | Freq: Three times a day (TID) | ORAL | 0 refills | Status: DC

## 2018-11-23 MED ORDER — FLUTICASONE PROPIONATE 50 MCG/ACT NA SUSP: 2.0000 | Freq: Every day | NASAL | 0 refills | Status: DC

## 2018-11-23 NOTE — ED Provider Notes (Signed)
[facility] EMERGENCY DEPARTMENT Provider Note   CSN: [id number] Arrival date & time: 11/23/18  1658     History   Chief Complaint Chief Complaint  Patient presents with  . Cough    HPI Paula [name] is a 27 y.o. female.  HPI   Paula [name] is a 27 y.o. female, patient with no pertinent past medical history, presenting to the ED with cough, sore throat, and nasal congestion for about the past week.  She has been trying TheraFlu and DayQuil.  Symptoms seem to be improving. Denies fever, chest pain, shortness of breath, rash, abdominal pain, N/V/D, or any other complaints.    Past Medical History:  Diagnosis Date  . Medical history non-contributory     Patient Active Problem List   Diagnosis Date Noted  . Acute respiratory failure (HCC)   . Postpartum complication 06/03/2016  . Cesarean delivery delivered--primary, NRFHR, abruption 05/29/2016  . Active labor at term 05/28/2016    Past Surgical History:  Procedure Laterality Date  . CESAREAN SECTION N/A 05/28/2016   Procedure: CESAREAN SECTION;  Surgeon: Silverio LaySandra Rivard, MD;  [facility];  Service: Obstetrics;  Laterality: N/A;  . WISDOM TOOTH EXTRACTION       OB History    Gravida  1   Para  1   Term  1   Preterm      AB      Living  1     SAB      TAB      Ectopic      Multiple  0   Live Births  1            Home Medications    Prior to Admission medications   Medication Sig Start Date End Date Taking? Authorizing Provider  albuterol (PROVENTIL) (2.5 MG/3ML) 0.083% nebulizer solution Take 3 mLs (2.5 mg total) by nebulization every 4 (four) hours as needed for wheezing or shortness of breath. 06/07/16  Yes Rivard, Dois DavenportSandra, MD  benzonatate (TESSALON) 100 MG capsule Take 1 capsule (100 mg total) by mouth every 8 (eight) hours. 11/23/18   Advith Martine C, PA-C  cetirizine (ZYRTEC ALLERGY) 10 MG tablet Take 1 tablet (10 mg total) by mouth daily.  08/21/18   Law, Waylan BogaAlexandra M, PA-C  fluticasone (FLONASE) 50 MCG/ACT nasal spray Place 2 sprays into both nostrils daily. 11/23/18   Kendrea Cerritos C, PA-C  Guaifenesin 1200 MG TB12 Take 1 tablet (1,200 mg total) by mouth 2 (two) times daily. 05/07/17   Lawyer, Cristal Deerhristopher, PA-C  ibuprofen (ADVIL,MOTRIN) 800 MG tablet Take 1 tablet (800 mg total) by mouth every 8 (eight) hours as needed for fever or moderate pain. 02/04/18   Petrucelli, Pleas KochSamantha R, PA-C  levonorgestrel (MIRENA) 20 MCG/24HR IUD 1 each by Intrauterine route once.    [provider]    Family History No family history on file.  Social History Social History   Tobacco Use  . Smoking status: Never Smoker  . Smokeless tobacco: Never Used  Substance Use Topics  . Alcohol use: No  . Drug use: No     Allergies   Patient has no known allergies.   Review of Systems Review of Systems  Constitutional: Negative for chills and fever.  HENT: Positive for congestion and sore throat. Negative for trouble swallowing and voice change.   Respiratory: Positive for cough. Negative for shortness of breath.   Cardiovascular: Negative for chest pain.  Gastrointestinal: Negative for abdominal pain, diarrhea,  nausea and vomiting.  Musculoskeletal: Negative for neck pain and neck stiffness.  Skin: Negative for rash.  Neurological: Negative for dizziness, light-headedness and headaches.  All other systems reviewed and are negative.    Physical Exam Updated Vital Signs BP 117/75   Pulse 88   Temp 98.2 F (36.8 C) (Oral)   Resp 18   Ht 5\' 4"  (1.626 m)   Wt 103.9 kg   LMP 10/28/2018   SpO2 99%   BMI 39.31 kg/m   Physical Exam Vitals signs and nursing note reviewed.  Constitutional:      General: She is not in acute distress.    Appearance: She is well-developed. She is not diaphoretic.  HENT:     Head: Normocephalic and atraumatic.     Nose: Mucosal edema present.     Right Sinus: No maxillary sinus tenderness or  frontal sinus tenderness.     Left Sinus: No maxillary sinus tenderness or frontal sinus tenderness.  Eyes:     Conjunctiva/sclera: Conjunctivae normal.  Neck:     Musculoskeletal: Neck supple.  Cardiovascular:     Rate and Rhythm: Normal rate and regular rhythm.     Heart sounds: Normal heart sounds.  Pulmonary:     Effort: Pulmonary effort is normal. No respiratory distress.     Breath sounds: Normal breath sounds.  Abdominal:     Palpations: Abdomen is soft.     Tenderness: There is no abdominal tenderness. There is no guarding.  Lymphadenopathy:     Cervical: No cervical adenopathy.  Skin:    General: Skin is warm and dry.  Neurological:     Mental Status: She is alert.  Psychiatric:        Behavior: Behavior normal.      ED Treatments / Results  Labs (all labs ordered are listed, but only abnormal results are displayed) Labs Reviewed - No data to display  EKG None  Radiology No results found.  Procedures Procedures (including critical care time)  Medications Ordered in ED Medications - No data to display   Initial Impression / Assessment and Plan / ED Course  I have reviewed the triage vital signs and the nursing notes.  Pertinent labs & imaging results that were available during my care of the patient were reviewed by me and considered in my medical decision making (see chart for details).     Patient presents with symptoms that give suspicion for viral syndrome. The patient was given instructions for home care as well as return precautions. Patient voices understanding of these instructions, accepts the plan, and is comfortable with discharge.  Final Clinical Impressions(s) / ED Diagnoses   Final diagnoses:  Viral syndrome    ED Discharge Orders         Ordered    benzonatate (TESSALON) 100 MG capsule  Every 8 hours     11/23/18 2057    fluticasone (FLONASE) 50 MCG/ACT nasal spray  Daily     11/23/18 2057           Anselm PancoastJoy, Luvinia Lucy C,  PA-C 11/23/18 2058    Little, Ambrose Finlandachel Morgan, MD 11/27/18 1401

## 2018-11-23 NOTE — ED Triage Notes (Signed)
Cough for a week. Sweating at night. She has been exposed to flu. No fever.

## 2018-11-23 NOTE — Discharge Instructions (Signed)
Your symptoms are likely consistent with a viral illness. Viruses do not require or respond to antibiotics. Treatment is symptomatic care and it is important to note that these symptoms may last for 7-14 days.  ° °Hand washing: Wash your hands throughout the day, but especially before and after touching the face, using the restroom, sneezing, coughing, or touching surfaces that have been coughed or sneezed upon. °Hydration: Symptoms will be intensified and complicated by dehydration. Dehydration can also extend the duration of symptoms. Drink plenty of fluids and get plenty of rest. You should be drinking at least half a liter of water an hour to stay hydrated. Electrolyte drinks (ex. Gatorade, Powerade, Pedialyte) are also encouraged. You should be drinking enough fluids to make your urine light yellow, almost clear. If this is not the case, you are not drinking enough water. °Please note that some of the treatments indicated below will not be effective if you are not adequately hydrated. °Pain or fever: Ibuprofen, Naproxen, or acetaminophen (generic for Tylenol) for pain or fever.  °Antiinflammatory medications: Take 600 mg of ibuprofen every 6 hours or 440 mg (over the counter dose) to 500 mg (prescription dose) of naproxen every 12 hours for the next 3 days. After this time, these medications may be used as needed for pain. Take these medications with food to avoid upset stomach. Choose only one of these medications, do not take them together. °Acetaminophen (generic for Tylenol): Should you continue to have additional pain while taking the ibuprofen or naproxen, you may add in acetaminophen as needed. Your daily total maximum amount of acetaminophen from all sources should be limited to 4000mg/day for persons without liver problems, or 2000mg/day for those with liver problems. °Cough: Use the benzonatate (generic for Tessalon) for cough.  °Zyrtec or Claritin: May add these medication daily to control underlying  symptoms of congestion, sneezing, and other signs of allergies.  These medications are available over-the-counter. Generics: Cetirizine (generic for Zyrtec) and loratadine (generic for Claritin). °Fluticasone: Use fluticasone (generic for Flonase), as directed, for nasal and sinus congestion.  This medication is available over-the-counter. °Congestion: Plain guaifenesin (generic for plain Mucinex) may help relieve congestion. Saline sinus rinses and saline nasal sprays may also help relieve congestion. If you do not have high blood pressure, heart problems, or an allergy to such medications, you may also try phenylephrine or Sudafed. °Sore throat: Warm liquids or Chloraseptic spray may help soothe a sore throat. Gargle twice a day with a salt water solution made from a half teaspoon of salt in a cup of warm water.  °Follow up: Follow up with a primary care provider within the next two weeks should symptoms fail to resolve. °Return: Return to the ED for significantly worsening symptoms, shortness of breath, persistent vomiting, large amounts of blood in stool, or any other major concerns. ° °For prescription assistance, may try using prescription discount sites or apps, such as goodrx.com °

## 2018-11-23 NOTE — ED Notes (Signed)
ED Provider at bedside. 

## 2019-10-23 ENCOUNTER — Ambulatory Visit (HOSPITAL_COMMUNITY)
Admission: EM | Admit: 2019-10-23 | Discharge: 2019-10-23 | Disposition: A | Discharge status: Home / Self Care | Payer: Medicaid Other | Attending: Emergency Medicine | Admitting: Emergency Medicine

## 2019-10-23 ENCOUNTER — Encounter (HOSPITAL_COMMUNITY): Payer: Self-pay

## 2019-10-23 ENCOUNTER — Other Ambulatory Visit: Payer: Self-pay

## 2019-10-23 DIAGNOSIS — R519 Headache, unspecified: Secondary | ICD-10-CM | POA: Insufficient documentation

## 2019-10-23 DIAGNOSIS — Z793 Long term (current) use of hormonal contraceptives: Secondary | ICD-10-CM | POA: Insufficient documentation

## 2019-10-23 DIAGNOSIS — R05 Cough: Secondary | ICD-10-CM

## 2019-10-23 DIAGNOSIS — Z20828 Contact with and (suspected) exposure to other viral communicable diseases: Secondary | ICD-10-CM | POA: Insufficient documentation

## 2019-10-23 DIAGNOSIS — J069 Acute upper respiratory infection, unspecified: Secondary | ICD-10-CM

## 2019-10-23 DIAGNOSIS — R0981 Nasal congestion: Secondary | ICD-10-CM | POA: Insufficient documentation

## 2019-10-23 DIAGNOSIS — Z79899 Other long term (current) drug therapy: Secondary | ICD-10-CM | POA: Insufficient documentation

## 2019-10-23 DIAGNOSIS — R110 Nausea: Secondary | ICD-10-CM | POA: Insufficient documentation

## 2019-10-23 MED ORDER — IBUPROFEN 800 MG PO TABS
ORAL_TABLET | ORAL | Status: AC
  Filled 2019-10-23: qty 1

## 2019-10-23 MED ORDER — IBUPROFEN 800 MG PO TABS
800.0000 mg | ORAL_TABLET | Freq: Once | ORAL | Status: AC
  Administered 2019-10-23: 12:00:00 800 mg via ORAL

## 2019-10-23 MED ORDER — CETIRIZINE HCL 10 MG PO TABS: 10.0000 mg | ORAL_TABLET | Freq: Every day | ORAL | 0 refills | Status: DC

## 2019-10-23 MED ORDER — IBUPROFEN 800 MG PO TABS: 800.0000 mg | ORAL_TABLET | Freq: Three times a day (TID) | ORAL | 0 refills | Status: DC | PRN

## 2019-10-23 NOTE — ED Provider Notes (Signed)
[place]    CSN: [id number] Arrival date & time: 10/23/19  1030      History   Chief Complaint Chief Complaint  Patient presents with  . Headache  . Cough  . Nasal Congestion    HPI Paula [name] is a 28 y.o. female.   [place] presents with complaints of congestion with headache. Started with 2 days of headache. Throat felt dry. These have improved. These started around 11/21. Has had cough now for approximately 4 days. Cough is now productive. Temp of 101 a few days, lasted only day. No body aches. Mild nausea, no diarrhea. This comes and goes. Still with headache, it is mild. No chest pain , no shortness of breath . No longer with sore throat. Congestion feels improved. Some facial pressure. No ear pain. No loss of taste or smell.  Daughter has had some URI symptoms. Her daughter attends daycare. Has been taking robitussin, it has helped. History of headaches. Trena works from home.    ROS per HPI, negative if not otherwise mentioned.      Past Medical History:  Diagnosis Date  . Medical history non-contributory     Patient Active Problem List   Diagnosis Date Noted  . Acute respiratory failure ([place])   . Postpartum complication 09/73/5329  . Cesarean delivery delivered--primary, NRFHR, abruption 05/29/2016  . Active labor at term 05/28/2016    Past Surgical History:  Procedure Laterality Date  . CESAREAN SECTION N/A 05/28/2016   Procedure: CESAREAN SECTION;  Surgeon: Delsa Bern, MD;  Location: [place];  Service: Obstetrics;  Laterality: N/A;  . WISDOM TOOTH EXTRACTION      OB History    Gravida  1   Para  1   Term  1   Preterm      AB      Living  1     SAB      TAB      Ectopic      Multiple  0   Live Births  1            Home Medications    Prior to Admission medications   Medication Sig Start Date End Date Taking? Authorizing Provider  Dextromethorphan Polistirex (ROBITUSSIN  12 HOUR COUGH PO) Take by mouth.   Yes [provider]  Multiple Vitamin (MULTIVITAMIN) tablet Take 1 tablet by mouth daily.   Yes [provider]  cetirizine (ZYRTEC ALLERGY) 10 MG tablet Take 1 tablet (10 mg total) by mouth daily. 10/23/19   Zigmund Gottron, NP  ibuprofen (ADVIL) 800 MG tablet Take 1 tablet (800 mg total) by mouth every 8 (eight) hours as needed for fever or moderate pain. 10/23/19   Zigmund Gottron, NP  albuterol (PROVENTIL) (2.5 MG/3ML) 0.083% nebulizer solution Take 3 mLs (2.5 mg total) by nebulization every 4 (four) hours as needed for wheezing or shortness of breath. 06/07/16 10/23/19  Delsa Bern, MD  fluticasone (FLONASE) 50 MCG/ACT nasal spray Place 2 sprays into both nostrils daily. 11/23/18 10/23/19  Joy, Helane Gunther, PA-C  levonorgestrel (MIRENA) 20 MCG/24HR IUD 1 each by Intrauterine route once.  10/23/19  [provider]    Family History History reviewed. No pertinent family history.  Social History Social History   Tobacco Use  . Smoking status: Never Smoker  . Smokeless tobacco: Never Used  Substance Use Topics  . Alcohol use: No  . Drug use: No     Allergies  Patient has no known allergies.   Review of Systems Review of Systems   Physical Exam Triage Vital Signs ED Triage Vitals  Enc Vitals Group     BP 10/23/19 1139 126/78     Pulse Rate 10/23/19 1139 64     Resp 10/23/19 1139 16     Temp 10/23/19 1139 98.8 F (37.1 C)     Temp Source 10/23/19 1139 Oral     SpO2 10/23/19 1139 100 %     Weight --      Height --      Head Circumference --      Peak Flow --      Pain Score 10/23/19 1137 3     Pain Loc --      Pain Edu? --      Excl. in GC? --    No data found.  Updated Vital Signs BP 126/78 (BP Location: Right Arm)   Pulse 64   Temp 98.8 F (37.1 C) (Oral)   Resp 16   LMP 10/08/2019 (Exact Date)   SpO2 100%    Physical Exam Constitutional:      General: She is not in acute distress.     Appearance: She is well-developed.  HENT:     Nose:     Right Sinus: No maxillary sinus tenderness or frontal sinus tenderness.     Left Sinus: No maxillary sinus tenderness or frontal sinus tenderness.     Mouth/Throat:     Mouth: Mucous membranes are moist.     Pharynx: Oropharynx is clear.     Tonsils: No tonsillar exudate. 1+ on the right. 1+ on the left.  Cardiovascular:     Rate and Rhythm: Normal rate.     Heart sounds: Normal heart sounds.  Pulmonary:     Effort: Pulmonary effort is normal.  Skin:    General: Skin is warm and dry.  Neurological:     Mental Status: She is alert and oriented to person, place, and time.      UC Treatments / Results  Labs (all labs ordered are listed, but only abnormal results are displayed) Labs Reviewed  NOVEL CORONAVIRUS, NAA (HOSP ORDER, SEND-OUT TO REF LAB; TAT 18-24 HRS)    EKG   Radiology No results found.  Procedures Procedures (including critical care time)  Medications Ordered in UC Medications  ibuprofen (ADVIL) tablet 800 mg (800 mg Oral Given 10/23/19 1210)  ibuprofen (ADVIL) 800 MG tablet (has no administration in time range)    Initial Impression / Assessment and Plan / UC Course  I have reviewed the triage vital signs and the nursing notes.  Pertinent labs & imaging results that were available during my care of the patient were reviewed by me and considered in my medical decision making (see chart for details).     Non toxic. Benign physical exam.  Afebrile here today. No increased work of breathing, no shortness of breath . covid-19 send out collected and pending. History and physical consistent with viral illness.  Supportive cares recommended. Return precautions provided. Patient verbalized understanding and agreeable to plan.   Final Clinical Impressions(s) / UC Diagnoses   Final diagnoses:  Viral URI with cough     Discharge Instructions     Push fluids to ensure adequate hydration and keep  secretions thin.  Tylenol and/or ibuprofen as needed for pain or fevers.  Rest.  Over the counter medications as needed for symptoms.  Zyrtec may help some with congestion, may  continue with robitussin as needed.  Self isolate until covid results are back and negative.  Will notify you by phone of any positive findings. Your negative results will be sent through your MyChart.     Please return if any worsening or persistent symptoms.     ED Prescriptions    Medication Sig Dispense Auth. Provider   ibuprofen (ADVIL) 800 MG tablet Take 1 tablet (800 mg total) by mouth every 8 (eight) hours as needed for fever or moderate pain. 30 tablet Linus Mako B, NP   cetirizine (ZYRTEC ALLERGY) 10 MG tablet Take 1 tablet (10 mg total) by mouth daily. 30 tablet Georgetta Haber, NP     PDMP not reviewed this encounter.   Georgetta Haber, NP 10/23/19 1212

## 2019-10-23 NOTE — ED Triage Notes (Signed)
Pt presents to the UC with headache and nasal congestion x 1 week. Pt states having cough x 4 days. Pt is taking Robitussin with mild relief.

## 2019-10-23 NOTE — Discharge Instructions (Signed)
Push fluids to ensure adequate hydration and keep secretions thin.  Tylenol and/or ibuprofen as needed for pain or fevers.  Rest.  Over the counter medications as needed for symptoms.  Zyrtec may help some with congestion, may continue with robitussin as needed.  Self isolate until covid results are back and negative.  Will notify you by phone of any positive findings. Your negative results will be sent through your MyChart.     Please return if any worsening or persistent symptoms.

## 2019-10-25 LAB — NOVEL CORONAVIRUS, NAA (HOSP ORDER, SEND-OUT TO REF LAB; TAT 18-24 HRS): SARS-CoV-2, NAA: NOT DETECTED

## 2021-02-27 ENCOUNTER — Emergency Department (HOSPITAL_BASED_OUTPATIENT_CLINIC_OR_DEPARTMENT_OTHER)
Admission: EM | Admit: 2021-02-27 | Discharge: 2021-02-27 | Disposition: A | Discharge status: Home / Self Care | Payer: BC Managed Care – PPO | Attending: Emergency Medicine | Admitting: Emergency Medicine

## 2021-02-27 ENCOUNTER — Other Ambulatory Visit (HOSPITAL_BASED_OUTPATIENT_CLINIC_OR_DEPARTMENT_OTHER): Payer: Self-pay

## 2021-02-27 ENCOUNTER — Other Ambulatory Visit: Payer: Self-pay

## 2021-02-27 ENCOUNTER — Encounter (HOSPITAL_BASED_OUTPATIENT_CLINIC_OR_DEPARTMENT_OTHER): Payer: Self-pay | Admitting: Emergency Medicine

## 2021-02-27 DIAGNOSIS — J069 Acute upper respiratory infection, unspecified: Secondary | ICD-10-CM | POA: Diagnosis not present

## 2021-02-27 DIAGNOSIS — R059 Cough, unspecified: Secondary | ICD-10-CM | POA: Diagnosis present

## 2021-02-27 DIAGNOSIS — R0982 Postnasal drip: Secondary | ICD-10-CM | POA: Diagnosis not present

## 2021-02-27 MED ORDER — FLUTICASONE PROPIONATE 50 MCG/ACT NA SUSP
2.0000 | Freq: Two times a day (BID) | NASAL | 0 refills | Status: DC | PRN
  Filled 2021-02-27: qty 16, 3d supply, fill #0

## 2021-02-27 NOTE — Discharge Instructions (Signed)
You can use flonase as needed for post-nasal drip for up to 3 days in a row.  Do not use long-term flonase.  You can take honey before bedtime for your postnasal drip.

## 2021-02-27 NOTE — ED Triage Notes (Signed)
Cough and sore throat x 3 weeks  Itchy throat yesterday

## 2021-02-27 NOTE — ED Provider Notes (Signed)
[facility] EMERGENCY DEPT Provider Note   CSN: [id number] Arrival date & time: 02/27/21  1254     History Chief Complaint  Patient presents with  . Cough    Paula [name] is a 30 y.o. female presenting to the emergency department with cough and postnasal drip.  The patient reports symptoms onset 3 weeks ago with a cough and nasal congestion.  He still continues have coughing, worse at night in bed.  She feels something running down her throat.  She has tried over-the-counter cough medicine with minimal relief.  She is mostly worried that she continues to be coughing 3 weeks later.  She denies fevers or chills.  She reports that her daughter, who is in daycare, came home with a cough just before the onset of her own symptoms.  HPI     Past Medical History:  Diagnosis Date  . Medical history non-contributory     Patient Active Problem List   Diagnosis Date Noted  . Acute respiratory failure ([facility])   . Postpartum complication 06/03/2016  . Cesarean delivery delivered--primary, NRFHR, abruption 05/29/2016  . Active labor at term 05/28/2016    Past Surgical History:  Procedure Laterality Date  . CESAREAN SECTION N/A 05/28/2016   Procedure: CESAREAN SECTION;  Surgeon: Silverio Lay, MD;  Location: [facility] BIRTH[facility] SU[facility];  Service: Obstetrics;  Laterality: N/A;  . WISDOM TOOTH EXTRACTION       OB History    Gravida  1   Para  1   Term  1   Preterm      AB      Living  1     SAB      IAB      Ectopic      Multiple  0   Live Births  1           No family history on file.  Social History   Tobacco Use  . Smoking status: Never Smoker  . Smokeless tobacco: Never Used  Substance Use Topics  . Alcohol use: Yes  . Drug use: No    Home Medications Prior to Admission medications   Medication Sig Start Date End Date Taking? Authorizing Provider  fluticasone (FLONASE) 50 MCG/ACT nasal spray Place 2 sprays into both nostrils 2 (two)  times daily as needed for up to 3 days for allergies or rhinitis. 02/27/21 03/02/21 Yes Fines Kimberlin, Kermit Balo, MD  cetirizine (ZYRTEC ALLERGY) 10 MG tablet Take 1 tablet (10 mg total) by mouth daily. 10/23/19   Georgetta Haber, NP  Dextromethorphan Polistirex (ROBITUSSIN 12 HOUR COUGH PO) Take by mouth.    [provider]  ibuprofen (ADVIL) 800 MG tablet Take 1 tablet (800 mg total) by mouth every 8 (eight) hours as needed for fever or moderate pain. 10/23/19   Georgetta Haber, NP  Multiple Vitamin (MULTIVITAMIN) tablet Take 1 tablet by mouth daily.    [provider]  albuterol (PROVENTIL) (2.5 MG/3ML) 0.083% nebulizer solution Take 3 mLs (2.5 mg total) by nebulization every 4 (four) hours as needed for wheezing or shortness of breath. 06/07/16 10/23/19  Silverio Lay, MD  levonorgestrel (MIRENA) 20 MCG/24HR IUD 1 each by Intrauterine route once.  10/23/19  [provider]    Allergies    Patient has no known allergies.  Review of Systems   Review of Systems  Constitutional: Negative for chills and fever.  HENT: Positive for congestion and postnasal drip.   Eyes: Negative for pain and visual disturbance.  Respiratory: Positive for cough. Negative for shortness of breath.   Cardiovascular: Negative for chest pain and palpitations.  Gastrointestinal: Negative for abdominal pain and vomiting.  Genitourinary: Negative for dysuria and hematuria.  Musculoskeletal: Negative for arthralgias and back pain.  Skin: Negative for color change and rash.  Neurological: Negative for syncope and light-headedness.  All other systems reviewed and are negative.   Physical Exam Updated Vital Signs BP 133/81 (BP Location: Right Arm)   Pulse 71   Temp 98.3 F (36.8 C) (Oral)   Resp 18   Ht 5\' 4"  (1.626 m)   Wt 108.9 kg   SpO2 98%   BMI 41.20 kg/m   Physical Exam Constitutional:      General: She is not in acute distress. HENT:     Head: Normocephalic and atraumatic.  Eyes:      Conjunctiva/sclera: Conjunctivae normal.     Pupils: Pupils are equal, round, and reactive to light.  Cardiovascular:     Rate and Rhythm: Normal rate and regular rhythm.     Pulses: Normal pulses.  Pulmonary:     Effort: Pulmonary effort is normal. No respiratory distress.  Abdominal:     General: There is no distension.     Tenderness: There is no abdominal tenderness.  Skin:    General: Skin is warm and dry.  Neurological:     General: No focal deficit present.     Mental Status: She is alert. Mental status is at baseline.  Psychiatric:        Mood and Affect: Mood normal.        Behavior: Behavior normal.     ED Results / Procedures / Treatments   Labs (all labs ordered are listed, but only abnormal results are displayed) Labs Reviewed - No data to display  EKG None  Radiology No results found.  Procedures Procedures   Medications Ordered in ED Medications - No data to display  ED Course  I have reviewed the triage vital signs and the nursing notes.  Pertinent labs & imaging results that were available during my care of the patient were reviewed by me and considered in my medical decision making (see chart for details).  Suspected viral infection with postnasal drip Lungs CTAB, no fever or hypoxia, doubt bacterial PNA, sepsis, or infection Okay for discharge, flonase    Final Clinical Impression(s) / ED Diagnoses Final diagnoses:  Viral URI with cough  Postnasal drip    Rx / DC Orders ED Discharge Orders         Ordered    fluticasone (FLONASE) 50 MCG/ACT nasal spray  2 times daily PRN        02/27/21 1340           04/29/21, MD 02/27/21 [phone]

## 2021-02-27 NOTE — ED Notes (Signed)
AVS reviewed with client, Rx also provided from ED Provider. Opportunity for questions provided. Pt assisted to Pharmacy

## 2021-03-31 ENCOUNTER — Other Ambulatory Visit: Payer: Self-pay

## 2021-03-31 ENCOUNTER — Encounter (HOSPITAL_BASED_OUTPATIENT_CLINIC_OR_DEPARTMENT_OTHER): Payer: Self-pay

## 2021-03-31 ENCOUNTER — Emergency Department (HOSPITAL_BASED_OUTPATIENT_CLINIC_OR_DEPARTMENT_OTHER)
Admission: EM | Admit: 2021-03-31 | Discharge: 2021-03-31 | Disposition: A | Discharge status: Home / Self Care | Payer: BC Managed Care – PPO | Attending: Emergency Medicine | Admitting: Emergency Medicine

## 2021-03-31 DIAGNOSIS — R1030 Lower abdominal pain, unspecified: Secondary | ICD-10-CM | POA: Diagnosis not present

## 2021-03-31 DIAGNOSIS — M545 Low back pain, unspecified: Secondary | ICD-10-CM | POA: Diagnosis not present

## 2021-03-31 DIAGNOSIS — R319 Hematuria, unspecified: Secondary | ICD-10-CM | POA: Diagnosis not present

## 2021-03-31 LAB — URINALYSIS, ROUTINE W REFLEX MICROSCOPIC
Bilirubin Urine: NEGATIVE
Glucose, UA: NEGATIVE mg/dL
Hgb urine dipstick: NEGATIVE
Ketones, ur: NEGATIVE mg/dL
Leukocytes,Ua: NEGATIVE
Nitrite: NEGATIVE
Specific Gravity, Urine: 1.029 (ref 1.005–1.030)
pH: 6 (ref 5.0–8.0)

## 2021-03-31 LAB — PREGNANCY, URINE: Preg Test, Ur: NEGATIVE

## 2021-03-31 NOTE — Discharge Instructions (Signed)
You do not have any blood in your urine right now or any signs of urinary tract infection.  If you have persistent pelvic pain and blood in your urine, consider following up with Women's health for outpatient ultrasound and further evaluation  See your doctor for follow-up  Return to ER if you have worse pelvic pain, uncontrolled bleeding, trouble urinating, fever, vomiting

## 2021-03-31 NOTE — ED Provider Notes (Signed)
[facility] EMERGENCY DEPT Provider Note   CSN: [id number] Arrival date & time: 03/31/21  1740     History Chief Complaint  Patient presents with  . Flank Pain    Paula [name] is a 30 y.o. female here presenting with intermittent flank pain and hematuria.  Patient states that for the last 8 days or so, she noticed couple episodes where she has blood when she wipes.  She states that her urine itself looks clear but does have some blood when she wipes.  Patient also has some intermittent lower abdominal pain as well.  Patient denies any vaginal discharge and is not concerned for STD.  She was concerned that she may be pregnant so she took a home pregnancy test that was negative.  Patient was complaining of some back pain as well.  However she states that it is intermittent and is more in the center her for her back.  She has no history of kidney stones.  Patient denies any fever or vomiting.   The history is provided by the patient.       Past Medical History:  Diagnosis Date  . Medical history non-contributory     Patient Active Problem List   Diagnosis Date Noted  . Acute respiratory failure ([facility])   . Postpartum complication 06/03/2016  . Cesarean delivery delivered--primary, NRFHR, abruption 05/29/2016  . Active labor at term 05/28/2016    Past Surgical History:  Procedure Laterality Date  . CESAREAN SECTION N/A 05/28/2016   Procedure: CESAREAN SECTION;  Surgeon: Silverio Lay, MD;  Location: [facility];  Service: Obstetrics;  Laterality: N/A;  . WISDOM TOOTH EXTRACTION       OB History    Gravida  1   Para  1   Term  1   Preterm      AB      Living  1     SAB      IAB      Ectopic      Multiple  0   Live Births  1           No family history on file.  Social History   Tobacco Use  . Smoking status: Never Smoker  . Smokeless tobacco: Never Used  Substance Use Topics  . Alcohol use: Yes  . Drug use: No     Home Medications Prior to Admission medications   Medication Sig Start Date End Date Taking? Authorizing Provider  cetirizine (ZYRTEC ALLERGY) 10 MG tablet Take 1 tablet (10 mg total) by mouth daily. 10/23/19   Georgetta Haber, NP  Dextromethorphan Polistirex (ROBITUSSIN 12 HOUR COUGH PO) Take by mouth.    [provider]  fluticasone (FLONASE) 50 MCG/ACT nasal spray Place 2 sprays into both nostrils 2 (two) times daily as needed for up to 3 days for allergies or rhinitis. 02/27/21 03/02/21  Terald Sleeper, MD  ibuprofen (ADVIL) 800 MG tablet Take 1 tablet (800 mg total) by mouth every 8 (eight) hours as needed for fever or moderate pain. 10/23/19   Georgetta Haber, NP  Multiple Vitamin (MULTIVITAMIN) tablet Take 1 tablet by mouth daily.    [provider]  albuterol (PROVENTIL) (2.5 MG/3ML) 0.083% nebulizer solution Take 3 mLs (2.5 mg total) by nebulization every 4 (four) hours as needed for wheezing or shortness of breath. 06/07/16 10/23/19  Silverio Lay, MD  levonorgestrel (MIRENA) 20 MCG/24HR IUD 1 each by Intrauterine route once.  10/23/19  [provider]  Allergies    Patient has no known allergies.  Review of Systems   Review of Systems  Genitourinary: Positive for hematuria.  Musculoskeletal: Positive for back pain.  All other systems reviewed and are negative.   Physical Exam Updated Vital Signs BP (!) 157/90 (BP Location: Right Arm)   Pulse 85   Temp 98.1 F (36.7 C) (Oral)   Resp 16   LMP 02/23/2021 (Exact Date)   SpO2 100%   Physical Exam Vitals and nursing note reviewed.  Constitutional:      Appearance: Normal appearance.  HENT:     Head: Normocephalic.     Nose: Nose normal.     Mouth/Throat:     Mouth: Mucous membranes are moist.  Eyes:     Extraocular Movements: Extraocular movements intact.     Pupils: Pupils are equal, round, and reactive to light.  Cardiovascular:     Rate and Rhythm: Normal rate and regular  rhythm.     Pulses: Normal pulses.     Heart sounds: Normal heart sounds.  Pulmonary:     Effort: Pulmonary effort is normal.     Breath sounds: Normal breath sounds.  Abdominal:     General: Abdomen is flat.     Palpations: Abdomen is soft.     Comments: No abdominal tenderness  Musculoskeletal:        General: Normal range of motion.     Cervical back: Normal range of motion and neck supple.     Comments: Mild left paralumbar tenderness and no CVA tenderness  Skin:    General: Skin is warm.     Capillary Refill: Capillary refill takes less than 2 seconds.  Neurological:     General: No focal deficit present.     Mental Status: She is alert and oriented to person, place, and time.  Psychiatric:        Mood and Affect: Mood normal.        Behavior: Behavior normal.     ED Results / Procedures / Treatments   Labs (all labs ordered are listed, but only abnormal results are displayed) Labs Reviewed  URINALYSIS, ROUTINE W REFLEX MICROSCOPIC - Abnormal; Notable for the following components:      Result Value   Protein, ur TRACE (*)    All other components within normal limits  PREGNANCY, URINE    EKG None  Radiology No results found.  Procedures Procedures   Medications Ordered in ED Medications - No data to display  ED Course  I have reviewed the triage vital signs and the nursing notes.  Pertinent labs & imaging results that were available during my care of the patient were reviewed by me and considered in my medical decision making (see chart for details).    MDM Rules/Calculators/A&P                         Paula [name] is a 30 y.o. female here with intermittent hematuria only when she wipes for the last 8 days.  Patient has some back pain as well.  I think likely breakthrough vaginal bleeding versus fibroids versus UTI.  Patient appears comfortable.  I doubt kidney stones.  Plan to get urinalysis and pregnancy test  6:08 PM Urinalysis showed no  blood or signs of UTI.  Pregnancy test is negative.  I told her that she should see GYN outpatient and get her outpatient ultrasound to look for fibroids if she has persistent bleeding  and pain    Final Clinical Impression(s) / ED Diagnoses Final diagnoses:  None    Rx / DC Orders ED Discharge Orders    None       Charlynne Pander, MD 03/31/21 [phone]

## 2021-03-31 NOTE — ED Triage Notes (Signed)
She reports bilat. Flank pain for past couple of days L > R. She denies fever, and tells me she has seen a sm. Amt. Of blood upon wiping after voiding. She states LNMP 02-23-2021 and that she took a home urine pregnancy test that was "negative". She is in no distress. She also denies dysuria and denies vag. D/c.

## 2021-07-21 ENCOUNTER — Inpatient Hospital Stay (EMERGENCY_DEPARTMENT_HOSPITAL)
Admission: AD | Admit: 2021-07-21 | Discharge: 2021-07-21 | Disposition: A | Discharge status: Home / Self Care | Payer: Medicaid Other | Source: Home / Self Care | Attending: Obstetrics and Gynecology | Admitting: Obstetrics and Gynecology

## 2021-07-21 ENCOUNTER — Emergency Department (HOSPITAL_BASED_OUTPATIENT_CLINIC_OR_DEPARTMENT_OTHER)
Admission: EM | Admit: 2021-07-21 | Discharge: 2021-07-21 | Disposition: A | Discharge status: Home / Self Care | Payer: Medicaid Other | Attending: Emergency Medicine | Admitting: Emergency Medicine

## 2021-07-21 ENCOUNTER — Encounter (HOSPITAL_BASED_OUTPATIENT_CLINIC_OR_DEPARTMENT_OTHER): Payer: Self-pay | Admitting: Emergency Medicine

## 2021-07-21 ENCOUNTER — Encounter (HOSPITAL_COMMUNITY): Payer: Self-pay | Admitting: Obstetrics and Gynecology

## 2021-07-21 ENCOUNTER — Other Ambulatory Visit: Payer: Self-pay

## 2021-07-21 DIAGNOSIS — I10 Essential (primary) hypertension: Secondary | ICD-10-CM | POA: Diagnosis not present

## 2021-07-21 DIAGNOSIS — M545 Low back pain, unspecified: Secondary | ICD-10-CM | POA: Insufficient documentation

## 2021-07-21 DIAGNOSIS — O3680X0 Pregnancy with inconclusive fetal viability, not applicable or unspecified: Secondary | ICD-10-CM

## 2021-07-21 DIAGNOSIS — M549 Dorsalgia, unspecified: Secondary | ICD-10-CM | POA: Diagnosis present

## 2021-07-21 DIAGNOSIS — M62838 Other muscle spasm: Secondary | ICD-10-CM | POA: Diagnosis not present

## 2021-07-21 HISTORY — DX: Essential (primary) hypertension: I10

## 2021-07-21 LAB — URINALYSIS, ROUTINE W REFLEX MICROSCOPIC
Bilirubin Urine: NEGATIVE
Glucose, UA: NEGATIVE mg/dL
Hgb urine dipstick: NEGATIVE
Ketones, ur: NEGATIVE mg/dL
Leukocytes,Ua: NEGATIVE
Nitrite: NEGATIVE
Protein, ur: 30 mg/dL — AB
Specific Gravity, Urine: 1.037 — ABNORMAL HIGH (ref 1.005–1.030)
pH: 5.5 (ref 5.0–8.0)

## 2021-07-21 LAB — PREGNANCY, URINE: Preg Test, Ur: NEGATIVE

## 2021-07-21 LAB — HCG, QUANTITATIVE, PREGNANCY: hCG, Beta Chain, Quant, S: 36 m[IU]/mL — ABNORMAL HIGH (ref <5)

## 2021-07-21 NOTE — MAU Provider Note (Signed)
Patient Paula [name] is a 30 y.o. G2P1001  At unknown gestation here with concern for miscarriage. She reports lower back pain and cramping for two weeks. She had a negative pregnancy test on 07/16/2021 but then had three positive home pregnancy tests last night and this morning. She went to Urgent Care to "make sure she is ok" due to her concerns about back pain and cramping. UPT at Urgent Care was negative and she was told to follow-up at her OB-GYN. She decided to come to MAU for evaluation.   She reports that her pain is a 3/10; she notices it on the left side and it comes and goes. She notices it but it is not painful.   Patient Vitals for the past 24 hrs:  BP Temp Temp src Pulse Resp SpO2 Height Weight  07/21/21 1543 (!) 146/80 99.2 F (37.3 C) Oral 75 18 98 % 5\' 4"  (1.626 m) 111.6 kg   -will draw bHCG given that patient reports pos home test but UPT neg at Urgent Care -BHCG is 36 1. Pregnancy of unknown anatomic location    -strict ectopic precautions given -appt scheduled for Tuesday at 8:30am -patient knows that she has to wait for results; Return to MAU with severe vaginal bleeding or severe abdominal pain. Long discussion with patient about ectopic vs. Healthy pregnancy vs. Miscarraige. Explained plan for each outcome. Patient would like to follow up with [facility], but she may start her care at [facility]. She is unsure.  Advised to return with worsening symptoms as an ectopic pregnancy cannot be ruled out at this time and this could be a life threatening condition. Pelvic rest - no tampons, no douching, no sex, nothing in the vagina. No heavy lifting or strenuous exercise.   [facility] Taetum Flewellen 07/21/2021, 4:15 PM

## 2021-07-21 NOTE — ED Provider Notes (Signed)
[facility] EMERGENCY DEPT Provider Note   CSN: [id number] Arrival date & time: 07/21/21  1327     History Chief Complaint  Patient presents with   Back Pain    Paula [name] is a 30 y.o. female.   Back Pain Associated symptoms: no dysuria and no pelvic pain     Patient complaining of back pain that started last night and woke her up from sleep. Says that she had similar back pain with an early miscarriage back in may. She is currently [redacted] weeks pregnant, last menstrual period in July. Patient denies any new partners, any flank pain, dysuria, pain with intercourse, vaginal bleeding, or abnormal discharge. Pain is not brought on with anything specific, she has not taken anything for the pain, the pain last night was probably an 8 and it is now a 3 or 4.   Past Medical History:  Diagnosis Date   Hypertension    Medical history non-contributory     Patient Active Problem List   Diagnosis Date Noted   Acute respiratory failure (HCC)    Postpartum complication 06/03/2016   Cesarean delivery delivered--primary, [facility], abruption 05/29/2016   Active labor at term 05/28/2016    Past Surgical History:  Procedure Laterality Date   CESAREAN SECTION N/A 05/28/2016   Procedure: CESAREAN SECTION;  Surgeon: Silverio Lay, MD;  Location: [facility] BIRTHING SUITES;  Service: Obstetrics;  Laterality: N/A;   WISDOM TOOTH EXTRACTION       OB History     Gravida  2   Para  1   Term  1   Preterm      AB      Living  1      SAB      IAB      Ectopic      Multiple  0   Live Births  1           No family history on file.  Social History   Tobacco Use   Smoking status: Never   Smokeless tobacco: Never  Substance Use Topics   Alcohol use: Never   Drug use: No    Home Medications Prior to Admission medications   Medication Sig Start Date End Date Taking? Authorizing Provider  cetirizine (ZYRTEC ALLERGY) 10 MG tablet Take 1 tablet (10 mg total)  by mouth daily. 10/23/19   Georgetta Haber, NP  Dextromethorphan Polistirex (ROBITUSSIN 12 HOUR COUGH PO) Take by mouth.    [provider]  fluticasone (FLONASE) 50 MCG/ACT nasal spray Place 2 sprays into both nostrils 2 (two) times daily as needed for up to 3 days for allergies or rhinitis. 02/27/21 03/02/21  Terald Sleeper, MD  ibuprofen (ADVIL) 800 MG tablet Take 1 tablet (800 mg total) by mouth every 8 (eight) hours as needed for fever or moderate pain. 10/23/19   Georgetta Haber, NP  Multiple Vitamin (MULTIVITAMIN) tablet Take 1 tablet by mouth daily.    [provider]  albuterol (PROVENTIL) (2.5 MG/3ML) 0.083% nebulizer solution Take 3 mLs (2.5 mg total) by nebulization every 4 (four) hours as needed for wheezing or shortness of breath. 06/07/16 10/23/19  Silverio Lay, MD  levonorgestrel (MIRENA) 20 MCG/24HR IUD 1 each by Intrauterine route once.  10/23/19  [provider]    Allergies    Patient has no known allergies.  Review of Systems   Review of Systems  Genitourinary:  Negative for difficulty urinating, dyspareunia, dysuria, flank pain, pelvic pain, vaginal bleeding  and vaginal discharge.  Musculoskeletal:  Positive for back pain.  All other systems reviewed and are negative.  Physical Exam Updated Vital Signs BP (!) 145/76   Pulse 99   Temp 98 F (36.7 C) (Oral)   Resp 18   LMP 02/23/2021 (Exact Date)   SpO2 99%   Physical Exam Vitals reviewed.  Constitutional:      General: She is not in acute distress.    Appearance: Normal appearance. She is obese. She is not ill-appearing.  HENT:     Head: Normocephalic and atraumatic.  Eyes:     Extraocular Movements: Extraocular movements intact.  Cardiovascular:     Rate and Rhythm: Normal rate.  Pulmonary:     Effort: Pulmonary effort is normal.  Abdominal:     General: Bowel sounds are normal. There is no distension.     Palpations: Abdomen is soft.     Tenderness: There is no abdominal  tenderness. There is no right CVA tenderness or left CVA tenderness.  Musculoskeletal:     Cervical back: Normal range of motion.  Skin:    General: Skin is warm and dry.  Neurological:     Mental Status: She is alert.    ED Results / Procedures / Treatments   Labs (all labs ordered are listed, but only abnormal results are displayed) Labs Reviewed  URINALYSIS, ROUTINE W REFLEX MICROSCOPIC - Abnormal; Notable for the following components:      Result Value   Specific Gravity, Urine 1.037 (*)    Protein, ur 30 (*)    All other components within normal limits  PREGNANCY, URINE    EKG None  Radiology No results found.  Procedures Procedures   Medications Ordered in ED Medications - No data to display  ED Course  I have reviewed the triage vital signs and the nursing notes.  Pertinent labs & imaging results that were available during my care of the patient were reviewed by me and considered in my medical decision making (see chart for details).    MDM Rules/Calculators/A&P                           Patient coming in with what is suspected to be musculoskeletal back pain. Normal vital signs, ambulating without difficulty. Urine pregnancy test is negative, UA does not suggest UTI. Low suspicion for ectopic pregnancy at this time. Patient non-tender on exam, including abdominal or back tenderness. Recommend patient follow up about prior positive pregnancy test with her Obgyn. Provided woman's health referral that patient can use if desired.   Final Clinical Impression(s) / ED Diagnoses Final diagnoses:  Acute low back pain without sciatica, unspecified back pain laterality  Muscle spasm    Rx / DC Orders ED Discharge Orders     None        Ilene Qua, MD 07/21/21 1507    Melene Plan, DO 07/24/21 1606

## 2021-07-21 NOTE — Discharge Instructions (Addendum)
You were seen at [facility] emergency department for back pain. We did a urine pregnancy test on you which was negative. Since you had a positive home pregnancy test previously it is likely that you had a miscarriage. We think that your back pain is related to the muscles in your back. We recommend that you follow up with your OBGYN.

## 2021-07-21 NOTE — ED Triage Notes (Signed)
Pt presents with back pain since last night  (lower back pain). Pt is [redacted] weeks pregnant. Pt had similar back pain in May and had a miscarriage (she was only about 4 weeks at the time and didn't know she was pregnant.).

## 2021-07-21 NOTE — MAU Note (Signed)
+  HPT last night and this morning.  Been having some cramping in low back and lower abd.  Similar to when had "chemical preg" in May, was a miscarriage.

## 2021-07-23 ENCOUNTER — Inpatient Hospital Stay (HOSPITAL_COMMUNITY): Payer: Medicaid Other

## 2021-07-23 ENCOUNTER — Other Ambulatory Visit: Payer: Self-pay

## 2021-07-23 ENCOUNTER — Inpatient Hospital Stay (HOSPITAL_COMMUNITY)
Admission: AD | Admit: 2021-07-23 | Discharge: 2021-07-23 | Disposition: A | Discharge status: Home / Self Care | Payer: Medicaid Other | Attending: Obstetrics and Gynecology | Admitting: Obstetrics and Gynecology

## 2021-07-23 DIAGNOSIS — O4691 Antepartum hemorrhage, unspecified, first trimester: Secondary | ICD-10-CM | POA: Diagnosis not present

## 2021-07-23 DIAGNOSIS — O3680X0 Pregnancy with inconclusive fetal viability, not applicable or unspecified: Secondary | ICD-10-CM | POA: Insufficient documentation

## 2021-07-23 DIAGNOSIS — Z3A01 Less than 8 weeks gestation of pregnancy: Secondary | ICD-10-CM | POA: Diagnosis not present

## 2021-07-23 DIAGNOSIS — O209 Hemorrhage in early pregnancy, unspecified: Secondary | ICD-10-CM | POA: Diagnosis present

## 2021-07-23 LAB — URINALYSIS, ROUTINE W REFLEX MICROSCOPIC
Bilirubin Urine: NEGATIVE
Glucose, UA: NEGATIVE mg/dL
Hgb urine dipstick: NEGATIVE
Ketones, ur: 5 mg/dL — AB
Leukocytes,Ua: NEGATIVE
Nitrite: NEGATIVE
Protein, ur: NEGATIVE mg/dL
Specific Gravity, Urine: 1.03 (ref 1.005–1.030)
pH: 5 (ref 5.0–8.0)

## 2021-07-23 LAB — HCG, QUANTITATIVE, PREGNANCY: hCG, Beta Chain, Quant, S: 90 m[IU]/mL — ABNORMAL HIGH (ref <5)

## 2021-07-23 NOTE — MAU Note (Signed)
Pt reports about one hour ago she went to the restroom and when she wiped there was some brown discharge and 2 small clots. Some left sided cramping off/on.

## 2021-07-23 NOTE — MAU Provider Note (Addendum)
History     CSN: [id number]  Arrival date and time: 07/23/21 1907  Seen by provider at 2045    Chief Complaint  Patient presents with   Vaginal Bleeding   Abdominal Pain   HPI Paula [name] 30 y.o. [redacted]w[redacted]d  Seen in MAU on 07-21-21.  Returns today with a brown bleeding covering the tissue when she wipes with 2 very small clots (1/2 of eraser in size) and some periodic stabbing lower abdominal pain since last night.   OB History     Gravida  3   Para  1   Term  1   Preterm      AB      Living  1      SAB      IAB      Ectopic      Multiple  0   Live Births  1           Past Medical History:  Diagnosis Date   Hypertension    Medical history non-contributory     Past Surgical History:  Procedure Laterality Date   CESAREAN SECTION N/A 05/28/2016   Procedure: CESAREAN SECTION;  Surgeon: Silverio Lay, MD;  Location: [facility] BIRTHING SUITES;  Service: Obstetrics;  Laterality: N/A;   WISDOM TOOTH EXTRACTION      No family history on file.  Social History   Tobacco Use   Smoking status: Never   Smokeless tobacco: Never  Substance Use Topics   Alcohol use: Never   Drug use: No    Allergies: No Known Allergies  Medications Prior to Admission  Medication Sig Dispense Refill Last Dose   cetirizine (ZYRTEC ALLERGY) 10 MG tablet Take 1 tablet (10 mg total) by mouth daily. 30 tablet 0    Dextromethorphan Polistirex (ROBITUSSIN 12 HOUR COUGH PO) Take by mouth.      fluticasone (FLONASE) 50 MCG/ACT nasal spray Place 2 sprays into both nostrils 2 (two) times daily as needed for up to 3 days for allergies or rhinitis. 16 g 0    Multiple Vitamin (MULTIVITAMIN) tablet Take 1 tablet by mouth daily.       Review of Systems  Constitutional:  Negative for fever.  Respiratory:  Negative for cough, shortness of breath and wheezing.   Gastrointestinal:  Positive for abdominal pain.  Genitourinary:  Positive for vaginal bleeding. Negative for dysuria and  vaginal discharge.  Physical Exam   Blood pressure (!) 145/87, pulse 81, temperature 98.5 F (36.9 C), temperature source Oral, resp. rate 17, last menstrual period 06/08/2021, SpO2 100 %, unknown if currently breastfeeding.  Physical Exam GENERAL: Well-developed, well-nourished female in no acute distress.  HEENT: Normocephalic, atraumatic.  LUNGS: Effort normal  HEART: Regular rate  SKIN: Warm, dry and without erythema  PSYCH: Normal mood and affect  MAU Course  Procedures  MDM Very stable at this time. Blood type O positive Quant has more than doubled Ultrasound ordered due to vaginal bleeding and more lower abdominal pain  Care assumed by Rennie Natter, CNM at 2100  Assessment and Plan    Paula [name] 07/23/2021, 8:42 PM   1. Pregnancy of unknown anatomic location   2. Vaginal bleeding in pregnancy, first trimester     Patient's quant is 90, which is a 100% increase in 48 hours.  Transvaginal US was done due to patient's complaint of increased pain and bleeding. US shows nothing in the uterus, still pregnancy of unknown location. Discussed that increase in quant was hopeful  sign, but that bleeding may be indication of pregnancy loss. Reviewed strict return precuations.  -Patient declines STI/wet prep testing -reviewed blood type is O pos -Patient app for stat bHCG was made for Thursday morning (48 hours).  -Reviewed location, expect to wait for results. Patient and partner verbalized understanding. -All questions answered  Luna Kitchens

## 2021-07-24 ENCOUNTER — Other Ambulatory Visit: Payer: Medicaid Other

## 2021-07-26 ENCOUNTER — Ambulatory Visit (INDEPENDENT_AMBULATORY_CARE_PROVIDER_SITE_OTHER): Payer: Medicaid Other

## 2021-07-26 ENCOUNTER — Other Ambulatory Visit: Payer: Self-pay

## 2021-07-26 VITALS — Wt 246.0 lb

## 2021-07-26 DIAGNOSIS — O209 Hemorrhage in early pregnancy, unspecified: Secondary | ICD-10-CM

## 2021-07-26 LAB — BETA HCG QUANT (REF LAB): hCG Quant: 231 m[IU]/mL

## 2021-07-26 NOTE — Progress Notes (Signed)
Pt here today for STAT Beta from MAU follow up on 07/23/2021. Pt states has irregular periods, but had last LMP 06/09/21.  Pt denies any vaginal bleeding, pain or cramps. Pt advised after results come back from Beta, will be contacted via phone with plan of care per Dr Annia Friendly. Pt verbalized understanding.    Paula [name]

## 2021-07-26 NOTE — Progress Notes (Signed)
Results are back. Reviewed results with Dr Annia Friendly. Pt advised normal rise in beta today and will need repeat US in 10-14 days.  Call placed to pt. Spoke with pt. Pt given results and recommendations per Dr Annia Friendly. Pt agreeable to plan of care and verbalized understanding. Pt scheduled for OB US on 9/15 at 9am. Pt agreeable to date and time of appt. Reviewed bleeding precautions with pt and when to return to MAU.   Judeth Cornfield, RN

## 2021-07-26 NOTE — Progress Notes (Signed)
Patient was evaluated by nursing staff. Agree with assessment and plan.  °

## 2021-08-09 ENCOUNTER — Ambulatory Visit: Payer: Medicaid Other

## 2021-08-09 ENCOUNTER — Other Ambulatory Visit: Payer: Self-pay

## 2021-08-09 ENCOUNTER — Ambulatory Visit
Admission: RE | Admit: 2021-08-09 | Discharge: 2021-08-09 | Disposition: A | Discharge status: Home / Self Care | Payer: Medicaid Other | Source: Ambulatory Visit | Attending: Family Medicine | Admitting: Family Medicine

## 2021-08-09 VITALS — BP 129/84 | HR 98 | Ht 64.0 in | Wt 248.0 lb

## 2021-08-09 DIAGNOSIS — O209 Hemorrhage in early pregnancy, unspecified: Secondary | ICD-10-CM

## 2021-08-09 DIAGNOSIS — O3680X0 Pregnancy with inconclusive fetal viability, not applicable or unspecified: Secondary | ICD-10-CM

## 2021-08-09 NOTE — Progress Notes (Addendum)
Pt here today for US OB results. Pt denies any vaginal bleeding or pain or cramps at this time. Pt advised about heavy bleeding/SAB precautions. Pt verbalized understanding. Reviewed OB US with Terri, NP. Pt given results and recommendations to have repeat OB US in 4 weeks and establish OB care. Pt agreeable to plan of care.  Pt scheduled for repeat OB US on 10/13 at 0900.  Pt states has established care at [facility]. Pt advised to call and make new OB appt and continue PNV. Pt agreeable to date and time of appt and plan of care.   Judeth Cornfield, RN    Nolene Bernheim, RN, MSN, NP-BC Nurse Practitioner, [facility] for [employer], [facility] 08/09/2021 12:16 PM

## 2021-08-27 ENCOUNTER — Inpatient Hospital Stay (HOSPITAL_COMMUNITY)
Admission: AD | Admit: 2021-08-27 | Discharge: 2021-08-27 | Disposition: A | Discharge status: Home / Self Care | Payer: Medicaid Other | Attending: Family Medicine | Admitting: Family Medicine

## 2021-08-27 ENCOUNTER — Encounter (HOSPITAL_COMMUNITY): Payer: Self-pay | Admitting: Obstetrics & Gynecology

## 2021-08-27 ENCOUNTER — Other Ambulatory Visit: Payer: Self-pay

## 2021-08-27 DIAGNOSIS — Z3A08 8 weeks gestation of pregnancy: Secondary | ICD-10-CM | POA: Insufficient documentation

## 2021-08-27 DIAGNOSIS — M7918 Myalgia, other site: Secondary | ICD-10-CM | POA: Insufficient documentation

## 2021-08-27 DIAGNOSIS — M545 Low back pain, unspecified: Secondary | ICD-10-CM | POA: Diagnosis present

## 2021-08-27 DIAGNOSIS — O26891 Other specified pregnancy related conditions, first trimester: Secondary | ICD-10-CM | POA: Diagnosis not present

## 2021-08-27 LAB — URINALYSIS, ROUTINE W REFLEX MICROSCOPIC
Bilirubin Urine: NEGATIVE
Glucose, UA: NEGATIVE mg/dL
Hgb urine dipstick: NEGATIVE
Ketones, ur: NEGATIVE mg/dL
Leukocytes,Ua: NEGATIVE
Nitrite: NEGATIVE
Protein, ur: NEGATIVE mg/dL
Specific Gravity, Urine: 1.029 (ref 1.005–1.030)
pH: 5 (ref 5.0–8.0)

## 2021-08-27 MED ORDER — ACETAMINOPHEN 500 MG PO TABS
1000.0000 mg | ORAL_TABLET | Freq: Once | ORAL | Status: AC
  Administered 2021-08-27: 1000 mg via ORAL
  Filled 2021-08-27: qty 2

## 2021-08-27 NOTE — MAU Note (Signed)
Pt reports pain does not radiate and is located at mid low back. Worsens with increased walking-resolved with rest.  Pt denies urgency of frequency with urination-denies pain or pressure. VSS. Afebrile.

## 2021-08-27 NOTE — Discharge Instructions (Signed)

## 2021-08-27 NOTE — MAU Provider Note (Signed)
History     CSN: [id number]  Arrival date and time: 08/27/21 1907   Event Date/Time   First Provider Initiated Contact with Patient 08/27/21 1953      Chief Complaint  Patient presents with   Back Pain   HPI Paula [name] is a 30 y.o. G3P1001 at [redacted]w[redacted]d who presents with lower back pain. She reports on Friday she went to the [employer] ball game and had to park far away in the rain so she walked really fast into the game. She states she has had some soreness in her back since then. Initially it was a 7/10 and today it has improved. She has not tried anything for the pain. She denies any bleeding or leaking.   OB History     Gravida  3   Para  1   Term  1   Preterm      AB      Living  1      SAB      IAB      Ectopic      Multiple  0   Live Births  1           Past Medical History:  Diagnosis Date   Hypertension    Gestatoinal   Medical history non-contributory     Past Surgical History:  Procedure Laterality Date   CESAREAN SECTION N/A 05/28/2016   Procedure: CESAREAN SECTION;  Surgeon: Silverio Lay, MD;  Location: [facility] BIRTHING SUITES;  Service: Obstetrics;  Laterality: N/A;   WISDOM TOOTH EXTRACTION      History reviewed. No pertinent family history.  Social History   Tobacco Use   Smoking status: Never   Smokeless tobacco: Never  Substance Use Topics   Alcohol use: Never   Drug use: No    Allergies: No Known Allergies  Medications Prior to Admission  Medication Sig Dispense Refill Last Dose   Multiple Vitamin (MULTIVITAMIN) tablet Take 1 tablet by mouth daily.   08/27/2021   cetirizine (ZYRTEC ALLERGY) 10 MG tablet Take 1 tablet (10 mg total) by mouth daily. (Patient not taking: Reported on 08/27/2021) 30 tablet 0 Not Taking    Review of Systems  Constitutional: Negative.  Negative for fatigue and fever.  HENT: Negative.    Respiratory: Negative.  Negative for shortness of breath.   Cardiovascular: Negative.  Negative for chest  pain.  Gastrointestinal: Negative.  Negative for abdominal pain, constipation, diarrhea, nausea and vomiting.  Genitourinary: Negative.  Negative for dysuria, vaginal bleeding and vaginal discharge.  Musculoskeletal:  Positive for back pain.  Neurological: Negative.  Negative for dizziness and headaches.  Physical Exam   Blood pressure 139/69, pulse 82, temperature 98.7 F (37.1 C), temperature source Oral, resp. rate 17, height 5\' 4"  (1.626 m), weight 114.5 kg, last menstrual period 06/08/2021, SpO2 99 %, unknown if currently breastfeeding.  Physical Exam Vitals and nursing note reviewed.  Constitutional:      General: She is not in acute distress.    Appearance: She is well-developed.  HENT:     Head: Normocephalic.  Eyes:     Pupils: Pupils are equal, round, and reactive to light.  Cardiovascular:     Rate and Rhythm: Normal rate and regular rhythm.     Heart sounds: Normal heart sounds.  Pulmonary:     Effort: Pulmonary effort is normal. No respiratory distress.     Breath sounds: Normal breath sounds.  Abdominal:     General: Bowel sounds are normal.  There is no distension.     Palpations: Abdomen is soft.     Tenderness: There is no abdominal tenderness.  Skin:    General: Skin is warm and dry.  Neurological:     Mental Status: She is alert and oriented to person, place, and time.  Psychiatric:        Mood and Affect: Mood normal.        Behavior: Behavior normal.        Thought Content: Thought content normal.        Judgment: Judgment normal.    MAU Course  Procedures Results for orders placed or performed during the hospital encounter of 08/27/21 (from the past 24 hour(s))  Urinalysis, Routine w reflex microscopic Urine, Clean Catch     Status: Abnormal   Collection Time: 08/27/21  7:28 PM  Result Value Ref Range   Color, Urine YELLOW YELLOW   APPearance HAZY (A) CLEAR   Specific Gravity, Urine 1.029 1.005 - 1.030   pH 5.0 5.0 - 8.0   Glucose, UA NEGATIVE  NEGATIVE mg/dL   Hgb urine dipstick NEGATIVE NEGATIVE   Bilirubin Urine NEGATIVE NEGATIVE   Ketones, ur NEGATIVE NEGATIVE mg/dL   Protein, ur NEGATIVE NEGATIVE mg/dL   Nitrite NEGATIVE NEGATIVE   Leukocytes,Ua NEGATIVE NEGATIVE    MDM UA  Pt informed that the ultrasound is considered a limited OB ultrasound and is not intended to be a complete ultrasound exam.  Patient also informed that the ultrasound is not being completed with the intent of assessing for fetal or placental anomalies or any pelvic abnormalities.  Explained that the purpose of today's ultrasound is to assess for  viability.  Patient acknowledges the purpose of the exam and the limitations of the study.    Fetal pole with FHR of 166 bpm confirmed  Tylenol PO- patient reports improvement of pain  Assessment and Plan   1. Musculoskeletal pain   2. [redacted] weeks gestation of pregnancy    -Discharge home in stable condition -Pain precautions discussed -Patient advised to follow-up with OB to establish prenatal care -Patient may return to MAU as needed or if her condition were to change or worsen   Rolm Bookbinder CNM 08/27/2021, 7:53 PM

## 2021-08-27 NOTE — MAU Note (Signed)
Pt having lower back pain after walking a lot on Friday. Still having some pain but not as bad as the other day. No bleeding or LOF.  Rates pain 7/10

## 2021-09-06 ENCOUNTER — Other Ambulatory Visit: Payer: Self-pay

## 2021-09-06 ENCOUNTER — Ambulatory Visit
Admission: RE | Admit: 2021-09-06 | Discharge: 2021-09-06 | Disposition: A | Discharge status: Home / Self Care | Payer: Medicaid Other | Source: Ambulatory Visit | Attending: Nurse Practitioner | Admitting: Nurse Practitioner

## 2021-09-06 ENCOUNTER — Other Ambulatory Visit: Payer: Self-pay | Admitting: Nurse Practitioner

## 2021-09-06 DIAGNOSIS — O3680X0 Pregnancy with inconclusive fetal viability, not applicable or unspecified: Secondary | ICD-10-CM

## 2021-09-07 ENCOUNTER — Telehealth: Payer: Self-pay | Admitting: Nurse Practitioner

## 2021-09-07 NOTE — Telephone Encounter (Signed)
Paula [name] 30 y.o. [redacted]w[redacted]d  Called patient.  Confirmed 2 identifiers.  Reviewed results of normal ultrasound done yesterday.  No questions voiced.  Is awaiting call to begin prenatal care and plans care at [facility].  Nolene Bernheim, RN, MSN, NP-BC Nurse Practitioner, [facility] for [employer], [employer] 09/07/2021 9:00 AM

## 2021-10-01 LAB — OB RESULTS CONSOLE GC/CHLAMYDIA
Chlamydia: NEGATIVE
Gonorrhea: NEGATIVE

## 2021-10-01 LAB — OB RESULTS CONSOLE RUBELLA ANTIBODY, IGM: Rubella: IMMUNE

## 2021-10-01 LAB — OB RESULTS CONSOLE ABO/RH: RH Type: POSITIVE

## 2021-10-01 LAB — OB RESULTS CONSOLE HGB/HCT, BLOOD
HCT: 34 (ref 29–41)
Hemoglobin: 11.5

## 2021-10-01 LAB — OB RESULTS CONSOLE HEPATITIS B SURFACE ANTIGEN: Hepatitis B Surface Ag: NEGATIVE

## 2021-10-01 LAB — OB RESULTS CONSOLE RPR: RPR: NONREACTIVE

## 2021-10-01 LAB — OB RESULTS CONSOLE ANTIBODY SCREEN: Antibody Screen: NEGATIVE

## 2021-10-01 LAB — CYSTIC FIBROSIS DIAGNOSTIC STUDY: Interpretation-CFDNA:: NEGATIVE

## 2021-10-01 LAB — OB RESULTS CONSOLE HIV ANTIBODY (ROUTINE TESTING): HIV: NONREACTIVE

## 2021-10-14 ENCOUNTER — Other Ambulatory Visit: Payer: Self-pay

## 2021-10-14 ENCOUNTER — Inpatient Hospital Stay (HOSPITAL_COMMUNITY)
Admission: AD | Admit: 2021-10-14 | Discharge: 2021-10-14 | Disposition: A | Discharge status: Home / Self Care | Payer: Commercial Managed Care - PPO | Attending: Obstetrics and Gynecology | Admitting: Obstetrics and Gynecology

## 2021-10-14 ENCOUNTER — Encounter (HOSPITAL_COMMUNITY): Payer: Self-pay | Admitting: Obstetrics and Gynecology

## 2021-10-14 DIAGNOSIS — O99891 Other specified diseases and conditions complicating pregnancy: Secondary | ICD-10-CM

## 2021-10-14 DIAGNOSIS — Z3A15 15 weeks gestation of pregnancy: Secondary | ICD-10-CM | POA: Diagnosis not present

## 2021-10-14 DIAGNOSIS — O26892 Other specified pregnancy related conditions, second trimester: Secondary | ICD-10-CM | POA: Diagnosis present

## 2021-10-14 DIAGNOSIS — M549 Dorsalgia, unspecified: Secondary | ICD-10-CM | POA: Diagnosis not present

## 2021-10-14 LAB — URINALYSIS, ROUTINE W REFLEX MICROSCOPIC
Bilirubin Urine: NEGATIVE
Glucose, UA: NEGATIVE mg/dL
Hgb urine dipstick: NEGATIVE
Ketones, ur: NEGATIVE mg/dL
Leukocytes,Ua: NEGATIVE
Nitrite: NEGATIVE
Protein, ur: NEGATIVE mg/dL
Specific Gravity, Urine: 1.027 (ref 1.005–1.030)
pH: 5 (ref 5.0–8.0)

## 2021-10-14 NOTE — MAU Provider Note (Signed)
History     CSN: [id number]  Arrival date and time: 10/14/21 1622   Event Date/Time   First Provider Initiated Contact with Patient 10/14/21 2000      Chief Complaint  Patient presents with   Back Pain   Headache   [name] is a 30 y.o. G3P1011 at [redacted]w[redacted]d who presents to MAU for low back pain and stiffness when sitting and walking. Patient states the pain and stiffness started 3 weeks ago. Patient reports she has experienced low back pain before pregnancy, but not accompanied by the stiffness. Patient reports over the past two days she has noticed she has been shifting a lot more in her chair trying to get comfortable. Patient reports she has not told her OB about this as she thought it would go away, but then when it did not, she did not think about calling her OB. Patient reports she has been using a heating pad, which she reports helps with the muscles in her back, but reports it does not help with the pain she feels over the bony prominences on the back of her pelvis. Patient reports with the heating pad the pain does go away. Patient states she also feels this when she walks, but she does not feel it when she is lying down, unless she is lying flat on her back. Patient denies issues with urination or defecation or difficulty walking.  Pt denies VB, LOF, ctx, decreased FM, vaginal discharge/odor/itching. Pt denies N/V, abdominal pain, constipation, diarrhea, or urinary problems. Pt denies fever, chills, fatigue, sweating or changes in appetite. Pt denies SOB or chest pain. Pt denies dizziness, HA, light-headedness, weakness.  Problems this pregnancy include: hx gHTN. Allergies? NKDA Current medications/supplements? PNV, iron, low dose ASA Prenatal care provider? [facility], next appt 10/22/2021   OB History     Gravida  3   Para  1   Term  1   Preterm      AB  1   Living  1      SAB  1   IAB      Ectopic      Multiple  0   Live Births  1         Obstetric Comments  "Chemical preg" C/s: "placenta started to detach"         Past Medical History:  Diagnosis Date   Chlamydia 2011   Hypertension    Gestatoinal   Medical history non-contributory     Past Surgical History:  Procedure Laterality Date   CESAREAN SECTION N/A 05/28/2016   Procedure: CESAREAN SECTION;  Surgeon: Delsa Bern, MD;  Location: [facility];  Service: Obstetrics;  Laterality: N/A;   WISDOM TOOTH EXTRACTION      Family History  Problem Relation Age of Onset   Healthy Father     Social History   Tobacco Use   Smoking status: Never   Smokeless tobacco: Never  Vaping Use   Vaping Use: Never used  Substance Use Topics   Alcohol use: Never   Drug use: No    Allergies: No Known Allergies  Medications Prior to Admission  Medication Sig Dispense Refill Last Dose   aspirin EC 81 MG tablet Take 81 mg by mouth daily. Swallow whole.   10/13/2021   Prenatal Vit-Fe Fumarate-FA (PRENATAL VITAMIN PO) Take by mouth.   10/13/2021   cetirizine (ZYRTEC ALLERGY) 10 MG tablet Take 1 tablet (10 mg total) by mouth daily. (Patient not taking: Reported on  08/27/2021) 30 tablet 0    Multiple Vitamin (MULTIVITAMIN) tablet Take 1 tablet by mouth daily.       Review of Systems  Constitutional:  Negative for chills, diaphoresis, fatigue and fever.  Eyes:  Negative for visual disturbance.  Respiratory:  Negative for shortness of breath.   Cardiovascular:  Negative for chest pain.  Gastrointestinal:  Negative for abdominal pain, constipation, diarrhea, nausea and vomiting.  Genitourinary:  Negative for dysuria, flank pain, frequency, pelvic pain, urgency, vaginal bleeding and vaginal discharge.  Musculoskeletal:  Positive for back pain.  Neurological:  Negative for dizziness, weakness, light-headedness and headaches.   Physical Exam   Blood pressure 123/64, pulse 77, temperature 98.4 F (36.9 C), temperature source Oral, resp. rate 17, height 5\' 4"  (1.626  m), weight 115.6 kg, last menstrual period 06/08/2021, SpO2 100 %, unknown if currently breastfeeding.  Patient Vitals for the past 24 hrs:  BP Temp Temp src Pulse Resp SpO2 Height Weight  10/14/21 1853 123/64 98.4 F (36.9 C) Oral 77 17 100 % -- --  10/14/21 1650 139/67 99.2 F (37.3 C) Oral 86 18 -- 5\' 4"  (1.626 m) 115.6 kg   Physical Exam Vitals and nursing note reviewed.  Constitutional:      General: She is not in acute distress.    Appearance: Normal appearance. She is not ill-appearing, toxic-appearing or diaphoretic.  HENT:     Head: Normocephalic and atraumatic.  Pulmonary:     Effort: Pulmonary effort is normal.  Musculoskeletal:       Back:  Skin:    General: Skin is warm and dry.  Neurological:     Mental Status: She is alert and oriented to person, place, and time.     Gait: Gait is intact.  Psychiatric:        Mood and Affect: Mood normal.        Behavior: Behavior normal.        Thought Content: Thought content normal.        Judgment: Judgment normal.   MAU Course  Procedures  MDM -back pain/stiffness mostly relieved by heat -no neurological changes, normal gait -no loss of bowel/bladder function -pinpoint tenderness over bony prominence of left lower back -normal exam otherwise -no emergency condition requiring treatment -pt discharged to home in stable condition  Orders Placed This Encounter  Procedures   Urinalysis, Routine w reflex microscopic Urine, Clean Catch    Standing Status:   Standing    Number of Occurrences:   1   Ambulatory referral to Physical Therapy    Referral Priority:   Routine    Referral Type:   Physical Medicine    Referral Reason:   Specialty Services Required    Requested Specialty:   Physical Therapy    Number of Visits Requested:   1   Discharge patient    Order Specific Question:   Discharge disposition    Answer:   01-Home or Self Care [1]    Order Specific Question:   Discharge patient date    Answer:    10/14/2021   No orders of the defined types were placed in this encounter.  Assessment and Plan   1. Back pain in pregnancy   2. [redacted] weeks gestation of pregnancy    Allergies as of 10/14/2021   No Known Allergies      Medication List     TAKE these medications    aspirin EC 81 MG tablet Take 81 mg by mouth daily. Swallow whole.  cetirizine 10 MG tablet Commonly known as: ZyrTEC Allergy Take 1 tablet (10 mg total) by mouth daily.   multivitamin tablet Take 1 tablet by mouth daily.   PRENATAL VITAMIN PO Take by mouth.       -referral physical therapy -return MAU/ED precautions given -pt discharged to home in stable condition  Gerrie Nordmann Yarel Kilcrease 10/14/2021, 8:39 PM

## 2021-10-14 NOTE — MAU Note (Signed)
Past couple wks  has been shifting in chair to get comfortable.  The last couple days, has noted increased pain in her lower back and tailbone. Hurts when she sits and when she walks.  Sits in a chair for work. Has been having headaches, just on the left side- the last 2 days.  Did not take anything for it.

## 2021-10-24 ENCOUNTER — Other Ambulatory Visit: Payer: Self-pay

## 2021-10-24 ENCOUNTER — Ambulatory Visit: Payer: Commercial Managed Care - PPO | Attending: Women's Health | Admitting: Physical Therapy

## 2021-10-24 ENCOUNTER — Encounter: Payer: Self-pay | Admitting: Physical Therapy

## 2021-10-24 DIAGNOSIS — R293 Abnormal posture: Secondary | ICD-10-CM | POA: Diagnosis present

## 2021-10-24 DIAGNOSIS — M545 Low back pain, unspecified: Secondary | ICD-10-CM | POA: Insufficient documentation

## 2021-10-24 DIAGNOSIS — O99891 Other specified diseases and conditions complicating pregnancy: Secondary | ICD-10-CM | POA: Diagnosis not present

## 2021-10-24 DIAGNOSIS — M549 Dorsalgia, unspecified: Secondary | ICD-10-CM | POA: Diagnosis not present

## 2021-10-24 DIAGNOSIS — M6281 Muscle weakness (generalized): Secondary | ICD-10-CM | POA: Diagnosis present

## 2021-10-24 NOTE — Therapy (Signed)
[facility] [address] [place], [place], [address] Phone: [phone]   Fax:  [phone]  Physical Therapy Evaluation  Patient Details  Name: Paula [name] MRN: [id number] Date of Birth: May 01, 1991 Referring Provider (PT): Marylen Ponto, NP   Encounter Date: 10/24/2021   PT End of Session - 10/24/21 0803     Visit Number 1    Number of Visits 27    Date for PT Re-Evaluation 12/19/21    Authorization Type [employer] Medicare, 27 visit limit    PT Start Time 0803    PT Stop Time 0845    PT Time Calculation (min) 42 min    Activity Tolerance Patient tolerated treatment well    Behavior During Therapy [facility] for tasks assessed/performed             Past Medical History:  Diagnosis Date   Chlamydia 2011   Hypertension    Gestatoinal   Medical history non-contributory     Past Surgical History:  Procedure Laterality Date   CESAREAN SECTION N/A 05/28/2016   Procedure: CESAREAN SECTION;  Surgeon: Silverio Lay, MD;  Location: [facility];  Service: Obstetrics;  Laterality: N/A;   WISDOM TOOTH EXTRACTION      There were no vitals filed for this visit.    Subjective Assessment - 10/24/21 0802     Subjective Pt is a 30yo female who is 16w 4d pregnant with ongoing Lt LBP, Lt buttock and tailbone pain, worse with sitting at work and in car.  Pt has pain with bed mobility, especially getting out of bed.  Pt gets relief in SL.    Pertinent History c-section 2017, HTN    Limitations Sitting    How long can you sit comfortably? 1 hour    Patient Stated Goals pain relief    Currently in Pain? Yes    Pain Score 8    can be as low as a 5/10   Pain Location Back    Pain Orientation Left;Lower    Pain Descriptors / Indicators Sharp;Aching   "like a shock in the tailbone"   Pain Type Acute pain    Pain Radiating Towards Lt buttock and tailbone    Pain Onset 1 to 4 weeks ago    Pain Frequency Constant     Aggravating Factors  sitting, bed mobility    Pain Relieving Factors icy hot, heat for LBP, nothing helps tailbone    Effect of Pain on Daily Activities sitting at work, getting out of bed, limiting household activiites                [facility] PT Assessment - 10/24/21 0001       Assessment   Medical Diagnosis O99.891,M54.9 (ICD-10-CM) - Back pain in pregnancy    Referring Provider (PT) Loralee Pacas, Odie Sera, NP    Onset Date/Surgical Date --   approx 2 weeks ago   Next MD Visit 11/16/21   with Nigel Bridgeman   Prior Therapy no      Precautions   Precautions Other (comment)    Precaution Comments pregnant      Restrictions   Weight Bearing Restrictions No      Balance Screen   Has the patient fallen in the past 6 months No    Has the patient had a decrease in activity level because of a fear of falling?  No    Is the patient reluctant to leave their home because of  a fear of falling?  No      Home Environment   Living Environment Private residence    Living Arrangements Spouse/significant other;Children    Home Access Level entry    Home Layout Two level    Additional Comments stairs hurt so takes her time      Prior Function   Level of Independence Independent    Vocation Full time employment    Vocation Requirements sitting for computer work    Leisure art      Observation/Other Assessments   Focus on Therapeutic Outcomes (FOTO)  30%, goal 59%      Functional Tests   Functional tests Single leg stance      Single Leg Stance   Comments able to balance on Rt/Lt, no pain      Posture/Postural Control   Posture/Postural Control Postural limitations    Postural Limitations Increased lumbar lordosis;Decreased thoracic kyphosis    Posture Comments Rt in-toeing, Lt knee hyperextended      ROM / Strength   AROM / PROM / Strength AROM;PROM;Strength      AROM   Overall AROM Comments lumbar flexion 30 deg with central LBP, lumbar hinge with extension    AROM Assessment  Site Lumbar    Lumbar Flexion 30   pain central lumbar   Lumbar Extension WNL   L5 hinge   Lumbar - Right Side Bend 20   flexes to avoid pure SB   Lumbar - Left Side Bend 20   flexes to avoid pure SB     PROM   Overall PROM Comments Lt hip IR/ER and flexion limited 50%, Rt hip flexion limited 50%      Strength   Overall Strength Comments Lt hip 4-/5, pain with resisted IR/ER, core 4-/5    Strength Assessment Site Hip    Right/Left Hip Left;Right    Right Hip ADduction 3+/5    Left Hip Flexion 4+/5    Left Hip Extension 4-/5    Left Hip External Rotation 4-/5    Left Hip Internal Rotation 4-/5    Left Hip ABduction 4-/5    Left Hip ADduction 3+/5      Flexibility   Soft Tissue Assessment /Muscle Length yes    Hamstrings limited 30% bil    Piriformis limited Lt>Rt    Levator Ani limited Lt      Palpation   Spinal mobility increased joint play Lt pubic shear, L5 PA    SI assessment  non-tender    Palpation comment pubic ramus Lt, L5 PA pain, Lt coccygeus      Transfers   Transfers Independent with all Transfers    Comments slow, educated on log rolling                        Objective measurements completed on examination: See above findings.       [facility] Adult PT Treatment/Exercise - 10/24/21 0001       Self-Care   Self-Care Posture;Other Self-Care Comments    Posture seated posture pillow Tush Cush or use folded towels to create pelvic gutter and lumbar roll - Pt with relief with this today.  Educated on use of TA and log rolling for bed mobility transfers      Exercises   Exercises Lumbar      Lumbar Exercises: Sidelying   Other Sidelying Lumbar Exercises TA with self-palpation for feedback (HEP)      Lumbar Exercises: Quadruped  Other Quadruped Lumbar Exercises rocking for pelvic floor opening/release (HEP)                       PT Short Term Goals - 10/24/21 0948       PT SHORT TERM GOAL #1   Title Pt will be compliant with  supported sitting strategies using towels or cushion for daily work to reduce pain in sitting    Time 3    Period Weeks    Status New    Target Date 11/14/21      PT SHORT TERM GOAL #2   Title Pt will demo TA engagement with log rolling for reduced pain with bed mobility.    Time 3    Period Weeks    Status New    Target Date 11/14/21               PT Long Term Goals - 10/24/21 0952       PT LONG TERM GOAL #1   Title Pt will be ind with HEP and understand how to safely support posture for duration of pregnancy.    Time 8    Period Weeks    Status New    Target Date 12/19/21      PT LONG TERM GOAL #2   Title Pt will improve Lt hip strength and core to at least 4+/5 to improve functional task performance such as stairs, sit to stand and functional squatting.    Time 8    Period Weeks    Status New    Target Date 12/19/21      PT LONG TERM GOAL #3   Title Pt will be able to perform work day with pain not to exceed 5/10.    Baseline reaches 8/10 in sitting    Time 8    Period Weeks    Status New    Target Date 12/19/21      PT LONG TERM GOAL #4   Title FOTO improved to 59% or greater to demo improved function.    Baseline 30%    Time 8    Period Weeks    Status New    Target Date 12/19/21      PT LONG TERM GOAL #5   Title Pt will improve LE flexibility to [facility] for hamstrings and hips to reduce undue tension on lumbar and pelvic regions.    Time 8    Period Weeks    Status New    Target Date 12/19/21                    Plan - 10/24/21 0956     Clinical Impression Statement Pt is a pleasant 30yo pregnant female with ongoing central and Lt sided lumbar, buttock and tailbone pain, worse with bed mobility and sitting at work.  At time of evaluation she is [redacted]w[redacted]d along with pregnancy.  She has history of c-section in 2017.  She presents with limited and painful trunk flexion, + Lt pubic shear with tender Lt pubic ramus, and tender coccyx and Lt coccygeus.   She has 4-/5 weakness in Lt hip and pain in pubic bone and lumbar region with resisted hip abd/add.  Lt hip ROM is limited in IR, ER and flexion.  Bil hamstrings and piriformis and gluteals are limited in length.  She has a single finger diastasis along abdominal midline and general core weakness.  Pt stands with lumbar hinge at L5.  PT educated Pt on supported use of sitting on towels to create pelvic gutter and lumbar roll which gave Pt relief on trial in clinic today.  PT intiated TA in SL and quadruped rocking, and practiced log rolling with TA engaged to avoid twisting or throwing one leg over the other which have been Pt's strategies, with pain, for bed transfers.  Pt will benefit from skilled PT to address findings and improve overall QOL and daily task tolerance as her pregnancy progresses.    Personal Factors and Comorbidities Fitness;Profession    Examination-Activity Limitations Transfers;Bed Mobility;Bend;Squat;Lift;Sit;Stairs;Locomotion Level    Examination-Participation Restrictions Driving;Cleaning;Occupation;Laundry;Meal Prep    Stability/Clinical Decision Making Stable/Uncomplicated    Clinical Decision Making Low    Rehab Potential Good    PT Frequency 2x / week    PT Duration 8 weeks    PT Treatment/Interventions ADLs/Self Care Home Management;Moist Heat;Neuromuscular re-education;Therapeutic exercise;Therapeutic activities;Functional mobility training;Patient/family education;Manual techniques;Passive range of motion;Taping;Aquatic Therapy    PT Next Visit Plan work on SL TA with log rolling and SL to sit for bed mobility, f/u on seated posture for work with use of towels, hip isometrics or low grade strength with core as tol, discuss options for pelvic support belts    PT Home Exercise Plan Access Code: [email or web address]    Consulted and Agree with Plan of Care Patient             Patient will benefit from skilled therapeutic intervention in order to improve the following deficits  and impairments:  Decreased range of motion, Pain, Decreased strength, Postural dysfunction, Impaired flexibility, Improper body mechanics, Decreased activity tolerance, Decreased mobility  Visit Diagnosis: Left-sided low back pain without sciatica, unspecified chronicity - Plan: PT plan of care cert/re-cert  Abnormal posture - Plan: PT plan of care cert/re-cert  Muscle weakness (generalized) - Plan: PT plan of care cert/re-cert     Problem List Patient Active Problem List   Diagnosis Date Noted   Acute respiratory failure (HCC)    Postpartum complication 06/03/2016   Cesarean delivery delivered--primary, NRFHR, abruption 05/29/2016   Active labor at term 05/28/2016    Morton Peters, PT 10/24/21 10:09 AM   [facility] [address], [place], [address] Phone: [phone]   Fax:  [phone]  Name: Paula [name] MRN: [id number] Date of Birth: 10/30/1991

## 2021-10-24 NOTE — Patient Instructions (Signed)
Access Code: [email or web address] URL: [email or web address] Date: 10/24/2021 Prepared by: Loistine Simas Amour Trigg  Exercises Sidelying Transversus Abdominis Bracing - 3 x daily - 7 x weekly - 1 sets - 10 reps - 10 hold Quadruped Rocking Slow - 3 x daily - 7 x weekly - 1 sets - 10 reps

## 2021-10-29 ENCOUNTER — Ambulatory Visit: Payer: Commercial Managed Care - PPO | Admitting: Rehabilitative and Restorative Service Providers"

## 2021-10-31 ENCOUNTER — Other Ambulatory Visit: Payer: Self-pay

## 2021-10-31 ENCOUNTER — Encounter: Payer: Self-pay | Admitting: Physical Therapy

## 2021-10-31 ENCOUNTER — Ambulatory Visit: Payer: Commercial Managed Care - PPO | Attending: Women's Health | Admitting: Physical Therapy

## 2021-10-31 DIAGNOSIS — M545 Low back pain, unspecified: Secondary | ICD-10-CM | POA: Insufficient documentation

## 2021-10-31 DIAGNOSIS — M6281 Muscle weakness (generalized): Secondary | ICD-10-CM | POA: Insufficient documentation

## 2021-10-31 DIAGNOSIS — R293 Abnormal posture: Secondary | ICD-10-CM | POA: Insufficient documentation

## 2021-10-31 NOTE — Patient Instructions (Signed)
Access Code: [email or web address] URL: [email or web address] Date: 10/31/2021 Prepared by: Loistine Simas Lyvonne Cassell  Exercises Sidelying Transversus Abdominis Bracing - 3 x daily - 7 x weekly - 1 sets - 10 reps - 10 hold Quadruped Rocking Slow - 3 x daily - 7 x weekly - 1 sets - 10 reps Supine Posterior Pelvic Tilt - 1 x daily - 7 x weekly - 1 sets - 15 reps Hooklying Isometric Hip Flexion - 1 x daily - 7 x weekly - 1 sets - 5 reps - 5 hold Child's Pose Stretch - 1 x daily - 7 x weekly - 1 sets - 3 reps - 10 hold Child's Pose with Sidebending - 1 x daily - 7 x weekly - 1 sets - 3 reps - 10 hold Quadruped Cat with Posterior Pelvic Tilt - 1 x daily - 7 x weekly - 1 sets - 5 reps - 5 hold   Serola Belt [email or web address] Or try [employer]

## 2021-10-31 NOTE — Therapy (Signed)
[facility] @ [facility] [address] [place], [place], [address] Phone: [phone]   Fax:  [phone]  Physical Therapy Treatment  Patient Details  Name: Paula [name] MRN: [id number] Date of Birth: 24-Sep-1991 Referring Provider (PT): Marylen Ponto, NP   Encounter Date: 10/31/2021   PT End of Session - 10/31/21 0800     Visit Number 2    Number of Visits 27    Date for PT Re-Evaluation 12/19/21    Authorization Type UHC Medicare, 27 visit limit    PT Start Time 0801    PT Stop Time 0847    PT Time Calculation (min) 46 min    Activity Tolerance Patient tolerated treatment well    Behavior During Therapy [facility] for tasks assessed/performed             Past Medical History:  Diagnosis Date   Chlamydia 2011   Hypertension    Gestatoinal   Medical history non-contributory     Past Surgical History:  Procedure Laterality Date   CESAREAN SECTION N/A 05/28/2016   Procedure: CESAREAN SECTION;  Surgeon: Silverio Lay, MD;  Location: [facility] BIRTHING SUITES;  Service: Obstetrics;  Laterality: N/A;   WISDOM TOOTH EXTRACTION      There were no vitals filed for this visit.   Subjective Assessment - 10/31/21 0801     Subjective I have pain in the central low back only.  No pain lately in tailbone and no shocking pains in pelvis.  I am still limiting sitting and walking more.  I didn't try the towel sitting.    Pertinent History c-section 2017, HTN    Limitations Sitting    How long can you sit comfortably? 1 hour    Patient Stated Goals pain relief    Currently in Pain? Yes    Pain Score 5     Pain Location Back    Pain Orientation Lower;Mid    Pain Descriptors / Indicators Aching    Pain Type Acute pain    Pain Onset More than a month ago    Pain Frequency Constant    Aggravating Factors  sitting too long    Pain Relieving Factors icy hot                               [facility] Adult PT  Treatment/Exercise - 10/31/21 0001       Self-Care   Self-Care Other Self-Care Comments    Other Self-Care Comments  trial of Serola belt (sample in clinic), Kinesiotex purpose and to watch for irritation and remove within 3-5 days of application if still on      Therapeutic Activites    Therapeutic Activities Other Therapeutic Activities    Other Therapeutic Activities log rolling to avoid twisting through lumbar spine x 5 reps, intial need for VC/TC then ind for final reps      Exercises   Exercises Lumbar;Knee/Hip      Lumbar Exercises: Stretches   Pelvic Tilt 15 reps;5 seconds    Pelvic Tilt Limitations VC/TC for initial reps, then good form   relief with this     Lumbar Exercises: Aerobic   Nustep L4 seat 8 x 4' PT present to discuss symptoms      Lumbar Exercises: Seated   Sit to Stand 5 reps    Sit to Stand Limitations with focus on TA use and hip hinge  Lumbar Exercises: Supine   Clam 10 reps    Clam Limitations green loop    Isometric Hip Flexion 5 reps;5 seconds    Isometric Hip Flexion Limitations Rt/Lt in hooklying      Lumbar Exercises: Sidelying   Clam Right;10 reps   paired with TA   Clam Limitations in Lt SL (preferred side to lay on)    Other Sidelying Lumbar Exercises Lt SL ball squeeze with TA 10x5"      Manual Therapy   Manual Therapy Taping    Kinesiotex Create Space      Kinesiotix   Create Space star pattern over L5/S1                       PT Short Term Goals - 10/24/21 0948       PT SHORT TERM GOAL #1   Title Pt will be compliant with supported sitting strategies using towels or cushion for daily work to reduce pain in sitting    Time 3    Period Weeks    Status New    Target Date 11/14/21      PT SHORT TERM GOAL #2   Title Pt will demo TA engagement with log rolling for reduced pain with bed mobility.    Time 3    Period Weeks    Status New    Target Date 11/14/21               PT Long Term Goals -  10/24/21 0952       PT LONG TERM GOAL #1   Title Pt will be ind with HEP and understand how to safely support posture for duration of pregnancy.    Time 8    Period Weeks    Status New    Target Date 12/19/21      PT LONG TERM GOAL #2   Title Pt will improve Lt hip strength and core to at least 4+/5 to improve functional task performance such as stairs, sit to stand and functional squatting.    Time 8    Period Weeks    Status New    Target Date 12/19/21      PT LONG TERM GOAL #3   Title Pt will be able to perform work day with pain not to exceed 5/10.    Baseline reaches 8/10 in sitting    Time 8    Period Weeks    Status New    Target Date 12/19/21      PT LONG TERM GOAL #4   Title FOTO improved to 59% or greater to demo improved function.    Baseline 30%    Time 8    Period Weeks    Status New    Target Date 12/19/21      PT LONG TERM GOAL #5   Title Pt will improve LE flexibility to [facility] for hamstrings and hips to reduce undue tension on lumbar and pelvic regions.    Time 8    Period Weeks    Status New    Target Date 12/19/21                   Plan - 10/31/21 0856     Clinical Impression Statement Pt arrives with report of 5/10 central low lumbar pain and report of eliminated/reduced tailbone pain so long as she limits sitting for work.  She has been compliant with initial HEP and is walking more  to break up sitting.  Pain is present with bed mobility and movement and described as achey.  She is using icy hot for relief.  PT worked on bed mobility to re-inforce log rolling since Pt was twisting through trunk rolling with trunk before legs.  She was able to log roll and transition SL to sit without pain.  PT introduced hip isometrics and low grade hip strength with TA focus with good tolerance.  Pt felt relief with gentle stretching into lumbar flexion (prayer, mad cat).  PT trialed Serola belt clinic sample with Pt and used KT tape for star pattern over  central L5/S1.  Updated HEP for hip strength and stretches.  Continue along POC.    PT Next Visit Plan review log roll and HEP, did Pt like star pattern KT tape?  progress glut strength as tol, especially in closed chain, intro core in standing, tape?    PT Home Exercise Plan Access Code: [email or web address]    Consulted and Agree with Plan of Care Patient             Patient will benefit from skilled therapeutic intervention in order to improve the following deficits and impairments:     Visit Diagnosis: Left-sided low back pain without sciatica, unspecified chronicity  Abnormal posture  Muscle weakness (generalized)     Problem List Patient Active Problem List   Diagnosis Date Noted   Acute respiratory failure (HCC)    Postpartum complication 06/03/2016   Cesarean delivery delivered--primary, [facility], abruption 05/29/2016   Active labor at term 05/28/2016    Morton Peters, PT 10/31/21 9:08 AM   [facility] @ [facility] [address] [place], [place], [address] Phone: [phone]   Fax:  [phone]  Name: Kaye Luoma MRN: [id number] Date of Birth: 11/07/1991

## 2021-11-05 ENCOUNTER — Other Ambulatory Visit: Payer: Self-pay

## 2021-11-05 ENCOUNTER — Encounter: Payer: Self-pay | Admitting: Rehabilitative and Restorative Service Providers"

## 2021-11-05 ENCOUNTER — Ambulatory Visit: Payer: Commercial Managed Care - PPO | Admitting: Rehabilitative and Restorative Service Providers"

## 2021-11-05 DIAGNOSIS — M545 Low back pain, unspecified: Secondary | ICD-10-CM | POA: Diagnosis not present

## 2021-11-05 DIAGNOSIS — R293 Abnormal posture: Secondary | ICD-10-CM

## 2021-11-05 DIAGNOSIS — M6281 Muscle weakness (generalized): Secondary | ICD-10-CM

## 2021-11-05 NOTE — Therapy (Addendum)
[facility] [place] @ [place], [place], [address] Phone: [phone]   Fax:  [phone]  Physical Therapy Treatment  Patient Details  Name: Paula [name] MRN: [id number] Date of Birth: 1991-05-08 Referring Provider (PT): Clarisa Fling, NP   Encounter Date: 11/05/2021   PT End of Session - 11/05/21 0850     Visit Number 3    Number of Visits 27    Date for PT Re-Evaluation 12/19/21    Authorization Type UHC Medicare, 27 visit limit    PT Start Time 0845    PT Stop Time 0925    PT Time Calculation (min) 40 min    Activity Tolerance Patient tolerated treatment well    Behavior During Therapy [facility] for tasks assessed/performed             Past Medical History:  Diagnosis Date   Chlamydia 2011   Hypertension    Gestatoinal   Medical history non-contributory     Past Surgical History:  Procedure Laterality Date   CESAREAN SECTION N/A 05/28/2016   Procedure: CESAREAN SECTION;  Surgeon: Delsa Bern, MD;  Location: [facility];  Service: Obstetrics;  Laterality: N/A;   WISDOM TOOTH EXTRACTION      There were no vitals filed for this visit.   Subjective Assessment - 11/05/21 0848     Subjective When I get out of bed, I have pain in my back and in my right hip.    Patient Stated Goals pain relief    Currently in Pain? Yes    Pain Score 4     Pain Location Back    Pain Orientation Mid;Lower    Pain Descriptors / Indicators Aching    Pain Type Acute pain                               [facility] Adult PT Treatment/Exercise - 11/05/21 0001       Transfers   Transfers Supine to Sit;Sit to Supine    Supine to Sit 5: Supervision    Supine to Sit Details (indicate cue type and reason) practiced log rolling with cuing for technique    Sit to Supine 5: Supervision    Sit to Supine Details (indicate cue type and reason) Verbal cues for sequencing;Verbal cues for technique     Sit to Supine Details  cuing for proper log rolling technique      Ambulation/Gait   Gait Comments Ambulation around both sides of PT gym with cuing for posture and maintaining proper pelvic alignment.      Lumbar Exercises: Aerobic   Nustep L4 seat 8 x 4' PT present to discuss symptoms   291 steps     Lumbar Exercises: Standing   Other Standing Lumbar Exercises Side stepping down length of mirror and back x2 laps      Lumbar Exercises: Seated   Sit to Stand 5 reps    Sit to Stand Limitations with focus on TA use and hip hinge    Other Seated Lumbar Exercises 4 way pelvic tilt on dynadisc x10 each    Other Seated Lumbar Exercises on dynadisc:  ab set, marching, LAQ.  cuing for maintaining TA contraction.  x10 each      Lumbar Exercises: Supine   Clam 10 reps    Clam Limitations green loop, 2x10    Bent Knee Raise 10 reps    Bent  Knee Raise Limitations hooklying marching 2x10B    Straight Leg Raise 5 reps    Straight Leg Raises Limitations partial range with assist on LLE    Isometric Hip Flexion 5 reps;5 seconds    Isometric Hip Flexion Limitations Rt/Lt in hooklying      Manual Therapy   Manual Therapy Taping    Kinesiotex Facilitate Muscle;Create Space      Kinesiotix   Create Space Two I strips over B lumbar paraspinals.  Two I strips from back to around abdomen to facilitate abdominal and core muscles to assist with taking pressure off back.                       PT Short Term Goals - 11/05/21 0943       PT SHORT TERM GOAL #1   Title Pt will be compliant with supported sitting strategies using towels or cushion for daily work to reduce pain in sitting    Status On-going      PT SHORT TERM GOAL #2   Title Pt will demo TA engagement with log rolling for reduced pain with bed mobility.    Status On-going               PT Long Term Goals - 10/24/21 4010       PT LONG TERM GOAL #1   Title Pt will be ind with HEP and understand how to safely  support posture for duration of pregnancy.    Time 8    Period Weeks    Status New    Target Date 12/19/21      PT LONG TERM GOAL #2   Title Pt will improve Lt hip strength and core to at least 4+/5 to improve functional task performance such as stairs, sit to stand and functional squatting.    Time 8    Period Weeks    Status New    Target Date 12/19/21      PT LONG TERM GOAL #3   Title Pt will be able to perform work day with pain not to exceed 5/10.    Baseline reaches 8/10 in sitting    Time 8    Period Weeks    Status New    Target Date 12/19/21      PT LONG TERM GOAL #4   Title FOTO improved to 59% or greater to demo improved function.    Baseline 30%    Time 8    Period Weeks    Status New    Target Date 12/19/21      PT LONG TERM GOAL #5   Title Pt will improve LE flexibility to [facility] for hamstrings and hips to reduce undue tension on lumbar and pelvic regions.    Time 8    Period Weeks    Status New    Target Date 12/19/21                   Plan - 11/05/21 2725     Clinical Impression Statement Pt tolerated session well and had a report of decrease in pain to 3/10 at the end of session.  Utilized kenisiotape to assist with back pain and abdominal support and facilitate abdominal muscle contraction at beginning of session. Pt reports feeling more abdominal support immediately following, and states that she feels some relief as session progressed through.  Pt required additional education with sit to/from supine on log roll technique and after performing  2 trials with correct technique, pt reports decreased pain during transfer.  Educated pt on the importance of using proper techniques and alignment duing transfers to decrease her pain, especially as her pregnancy progresses.  Pt verbalizes understanding and returned demonstration.    Personal Factors and Comorbidities Fitness;Profession    Examination-Activity Limitations Transfers;Bed  Mobility;Bend;Squat;Lift;Sit;Stairs;Locomotion Level    PT Treatment/Interventions ADLs/Self Care Home Management;Moist Heat;Neuromuscular re-education;Therapeutic exercise;Therapeutic activities;Functional mobility training;Patient/family education;Manual techniques;Passive range of motion;Taping;Aquatic Therapy    PT Next Visit Plan review log roll and HEP, assess kinesiotape,  progress glut strength as tol, especially in closed chain, intro core in standing    Consulted and Agree with Plan of Care Patient             Patient will benefit from skilled therapeutic intervention in order to improve the following deficits and impairments:  Decreased range of motion, Pain, Decreased strength, Postural dysfunction, Impaired flexibility, Improper body mechanics, Decreased activity tolerance, Decreased mobility  Visit Diagnosis: Left-sided low back pain without sciatica, unspecified chronicity  Abnormal posture  Muscle weakness (generalized)     Problem List Patient Active Problem List   Diagnosis Date Noted   Acute respiratory failure ([place])    Postpartum complication 28/83/3744   Cesarean delivery delivered--primary, [facility], abruption 05/29/2016   Active labor at term 05/28/2016    Juel Burrow, PT, DPT 11/05/2021, 9:44 AM  PHYSICAL THERAPY DISCHARGE SUMMARY  Visits from Start of Care: 3  Current functional level related to goals / functional outcomes: Pt with much improved pain and mobility in 3 visits.  Pt did not return after 3rd visit and certification for treatment expired.  D/C PT.   Remaining deficits: See above   Education / Equipment: HEP  Patient agrees to discharge. Patient goals were partially met. Patient is being discharged due to not returning since the last visit.  Baruch Merl, PT 12/25/21 9:52 AM  [facility] @ [place], [place], [address] Phone: [phone]   Fax:   [phone]  Name: Paula [name] MRN: [id number] Date of Birth: [birth year]

## 2021-11-07 ENCOUNTER — Encounter: Payer: Self-pay | Admitting: Physical Therapy

## 2021-11-14 ENCOUNTER — Encounter: Payer: Self-pay | Admitting: Physical Therapy

## 2021-11-28 ENCOUNTER — Encounter (HOSPITAL_COMMUNITY): Payer: Self-pay | Admitting: Obstetrics & Gynecology

## 2021-11-28 ENCOUNTER — Inpatient Hospital Stay (HOSPITAL_COMMUNITY)
Admission: AD | Admit: 2021-11-28 | Discharge: 2021-11-28 | Disposition: A | Discharge status: Home / Self Care | Payer: Medicaid Other | Attending: Obstetrics & Gynecology | Admitting: Obstetrics & Gynecology

## 2021-11-28 DIAGNOSIS — O162 Unspecified maternal hypertension, second trimester: Secondary | ICD-10-CM | POA: Diagnosis not present

## 2021-11-28 DIAGNOSIS — R030 Elevated blood-pressure reading, without diagnosis of hypertension: Secondary | ICD-10-CM | POA: Diagnosis not present

## 2021-11-28 DIAGNOSIS — Z3A22 22 weeks gestation of pregnancy: Secondary | ICD-10-CM | POA: Insufficient documentation

## 2021-11-28 DIAGNOSIS — O26892 Other specified pregnancy related conditions, second trimester: Secondary | ICD-10-CM | POA: Insufficient documentation

## 2021-11-28 DIAGNOSIS — O99891 Other specified diseases and conditions complicating pregnancy: Secondary | ICD-10-CM | POA: Diagnosis not present

## 2021-11-28 DIAGNOSIS — R519 Headache, unspecified: Secondary | ICD-10-CM | POA: Diagnosis not present

## 2021-11-28 DIAGNOSIS — Z3A21 21 weeks gestation of pregnancy: Secondary | ICD-10-CM

## 2021-11-28 DIAGNOSIS — R110 Nausea: Secondary | ICD-10-CM | POA: Insufficient documentation

## 2021-11-28 LAB — URINALYSIS, ROUTINE W REFLEX MICROSCOPIC
Bilirubin Urine: NEGATIVE
Glucose, UA: NEGATIVE mg/dL
Hgb urine dipstick: NEGATIVE
Ketones, ur: NEGATIVE mg/dL
Leukocytes,Ua: NEGATIVE
Nitrite: NEGATIVE
Protein, ur: NEGATIVE mg/dL
Specific Gravity, Urine: <1.005 — ABNORMAL LOW (ref 1.005–1.030)
pH: 6 (ref 5.0–8.0)

## 2021-11-28 LAB — CBC
HCT: 31 % — ABNORMAL LOW (ref 36.0–46.0)
Hemoglobin: 10.2 g/dL — ABNORMAL LOW (ref 12.0–15.0)
MCH: 30.6 pg (ref 26.0–34.0)
MCHC: 32.9 g/dL (ref 30.0–36.0)
MCV: 93.1 fL (ref 80.0–100.0)
Platelets: 214 10*3/uL (ref 150–400)
RBC: 3.33 MIL/uL — ABNORMAL LOW (ref 3.87–5.11)
RDW: 13.7 % (ref 11.5–15.5)
WBC: 10 10*3/uL (ref 4.0–10.5)
nRBC: 0 % (ref 0.0–0.2)

## 2021-11-28 LAB — PROTEIN / CREATININE RATIO, URINE
Creatinine, Urine: 48.58 mg/dL
Total Protein, Urine: <6 mg/dL

## 2021-11-28 LAB — COMPREHENSIVE METABOLIC PANEL
ALT: 17 U/L (ref 0–44)
AST: 16 U/L (ref 15–41)
Albumin: 2.7 g/dL — ABNORMAL LOW (ref 3.5–5.0)
Alkaline Phosphatase: 67 U/L (ref 38–126)
Anion gap: 7 (ref 5–15)
BUN: 5 mg/dL — ABNORMAL LOW (ref 6–20)
CO2: 24 mmol/L (ref 22–32)
Calcium: 8.8 mg/dL — ABNORMAL LOW (ref 8.9–10.3)
Chloride: 104 mmol/L (ref 98–111)
Creatinine, Ser: 0.67 mg/dL (ref 0.44–1.00)
GFR, Estimated: >60 mL/min (ref >60)
Glucose, Bld: 83 mg/dL (ref 70–99)
Potassium: 3.9 mmol/L (ref 3.5–5.1)
Sodium: 135 mmol/L (ref 135–145)
Total Bilirubin: 0.5 mg/dL (ref 0.3–1.2)
Total Protein: 6.1 g/dL — ABNORMAL LOW (ref 6.5–8.1)

## 2021-11-28 MED ORDER — BUTALBITAL-APAP-CAFFEINE 50-325-40 MG PO TABS
2.0000 | ORAL_TABLET | ORAL | Status: DC | PRN
  Administered 2021-11-28: 2 via ORAL
  Filled 2021-11-28: qty 2

## 2021-11-28 MED ORDER — ONDANSETRON HCL 4 MG PO TABS: 4.0000 mg | ORAL_TABLET | Freq: Three times a day (TID) | ORAL | 0 refills | Status: DC | PRN

## 2021-11-28 NOTE — Discharge Instructions (Signed)
-  return to MAU if you start having severe headache, ongoing nausea and vomiting, blurry vision, floating spots in your vision, sudden all over edema

## 2021-11-28 NOTE — MAU Provider Note (Addendum)
Patient Paula [name] is a 31 y.o. G3P1011  At [redacted]w[redacted]d here with complaints of HA and nausea that started on Friday, December 30 (5 days ago). She reports feeling queasy after she eats; she is not vomiting. She denies vaginal bleeding, vaginal discharge, contractions. She reports some fetal movements. She denies pain with urination.  She has a history of GHTN but has not been diagnosed with CHTN. She states she has had no elevated BPs in her pregnancy so far.  She denies blurry vision, floating spots, RUQ pain, sudden all over edema.   She reports that she has been eating and drinking enough and she does not feel dehydrated. She thought maybe her nausea and HA was due to the food and activity at the holidays but she still had a HA when she woke up. She called her afterhours number for [facility] and the nurse told her to come to MAU for evaluation.  History     CSN: [id number]  Arrival date and time: 11/28/21 0912   None     Chief Complaint  Patient presents with   Headache   Nausea   Headache  This is a new problem. The current episode started in the past 7 days. The problem has been waxing and waning. The pain is located in the Frontal region. Quality: can go up to a 5. The pain is at a severity of 3/10. Pertinent negatives include no back pain, blurred vision, coughing, fever, phonophobia, photophobia, sore throat or vomiting. Nothing aggravates the symptoms. Treatments tried: rest makes it better. There is no history of migraine headaches or recent head traumas.   She also reports nausea; this is a queasy feeling that happens after she eats. This starts after she eats but she does not throw.   OB History     Gravida  3   Para  1   Term  1   Preterm      AB  1   Living  1      SAB  1   IAB      Ectopic      Multiple  0   Live Births  1        Obstetric Comments  "Chemical preg" C/s: "placenta started to detach"         Past Medical History:  Diagnosis  Date   Chlamydia 2011   Hypertension    Gestatoinal   Medical history non-contributory     Past Surgical History:  Procedure Laterality Date   CESAREAN SECTION N/A 05/28/2016   Procedure: CESAREAN SECTION;  Surgeon: Silverio Lay, MD;  Location: [facility] BIRTHING SUITES;  Service: Obstetrics;  Laterality: N/A;   WISDOM TOOTH EXTRACTION      Family History  Problem Relation Age of Onset   Healthy Father     Social History   Tobacco Use   Smoking status: Never   Smokeless tobacco: Never  Vaping Use   Vaping Use: Never used  Substance Use Topics   Alcohol use: Never   Drug use: No    Allergies: No Known Allergies  Medications Prior to Admission  Medication Sig Dispense Refill Last Dose   aspirin EC 81 MG tablet Take 81 mg by mouth daily. Swallow whole.   11/27/2021   ferrous sulfate 325 (65 FE) MG tablet Take 325 mg by mouth daily with breakfast.   11/27/2021   Prenatal Vit-Fe Fumarate-FA (PRENATAL VITAMIN PO) Take by mouth.   11/27/2021   cetirizine (ZYRTEC ALLERGY) 10  MG tablet Take 1 tablet (10 mg total) by mouth daily. (Patient not taking: Reported on 08/27/2021) 30 tablet 0 Unknown   Multiple Vitamin (MULTIVITAMIN) tablet Take 1 tablet by mouth daily.   Unknown    Review of Systems  Constitutional: Negative.  Negative for fever.  HENT: Negative.  Negative for sore throat.   Eyes:  Negative for blurred vision and photophobia.  Respiratory: Negative.  Negative for cough.   Cardiovascular: Negative.   Gastrointestinal: Negative.  Negative for vomiting.  Genitourinary: Negative.  Negative for vaginal bleeding and vaginal discharge.  Musculoskeletal:  Negative for back pain.  Skin: Negative.   Neurological:  Positive for headaches.  Physical Exam   Blood pressure 130/64, pulse 76, temperature 98.1 F (36.7 C), temperature source Oral, resp. rate 18, weight 117.6 kg, last menstrual period 06/08/2021, SpO2 100 %, unknown if currently breastfeeding.  Physical  Exam Constitutional:      Appearance: She is well-developed.  HENT:     Head: Normocephalic.  Pulmonary:     Effort: Pulmonary effort is normal.  Abdominal:     Palpations: Abdomen is soft.  Musculoskeletal:        General: Normal range of motion.  Neurological:     Mental Status: She is alert.  Psychiatric:        Mood and Affect: Mood normal.        Behavior: Behavior normal.    MAU Course  Procedures  MDM Reassessment (11:49 AM)  -Patient had 2 tabs of fioricet while in MAU; HA now a 1/10 -CBC normal -CMP and PCR pending  Patient Vitals for the past 24 hrs:  BP Temp Temp src Pulse Resp SpO2 Weight  11/28/21 1146 130/64 -- -- 76 -- -- --  11/28/21 1131 132/61 -- -- 77 -- -- --  11/28/21 1116 134/71 -- -- 85 -- -- --  11/28/21 1101 132/68 -- -- 82 -- -- --  11/28/21 1046 (!) 145/77 -- -- 87 -- -- --  11/28/21 1031 138/69 -- -- 86 -- -- --  11/28/21 1016 138/70 -- -- 85 -- -- --  11/28/21 1001 (!) 146/72 -- -- 89 -- -- --  11/28/21 0946 (!) 142/74 -- -- 89 -- -- --  11/28/21 0943 (!) 145/74 -- -- 87 -- -- --  11/28/21 0929 (!) 141/76 -- -- 88 -- -- --  11/28/21 0928 (!) 141/76 98.1 F (36.7 C) Oral 86 18 100 % 117.6 kg   Reassessment (12:17 PM) -PCR is normal -CMP is normal FHR is 152 by Doppler  Assessment and Plan   1. Transient hypertension   -Patient feels much better; HA resolved -Will send home with RX for zofran and she can try Tylenol at home for HA; can discuss other HA management with OB -keep upcoming appt on 12/14/2021 -Patient given strict return precautions and BP warnings  Charlesetta Garibaldi Sedalia Greeson 11/28/2021, 11:49 AM

## 2021-11-28 NOTE — MAU Note (Signed)
Pt reports to MAU with c/o headaches that started on Friday that is accompanied with nausea.  Pt states that she took another ASA 81 mg and it was not effective.  +FM

## 2021-12-19 ENCOUNTER — Other Ambulatory Visit: Payer: Self-pay

## 2021-12-19 ENCOUNTER — Encounter: Payer: Self-pay | Admitting: Family Medicine

## 2021-12-19 ENCOUNTER — Ambulatory Visit (INDEPENDENT_AMBULATORY_CARE_PROVIDER_SITE_OTHER): Payer: Medicaid Other | Admitting: Family Medicine

## 2021-12-19 VITALS — BP 137/83 | HR 88 | Wt 259.3 lb

## 2021-12-19 DIAGNOSIS — Z148 Genetic carrier of other disease: Secondary | ICD-10-CM | POA: Insufficient documentation

## 2021-12-19 DIAGNOSIS — Z98891 History of uterine scar from previous surgery: Secondary | ICD-10-CM

## 2021-12-19 DIAGNOSIS — O0990 Supervision of high risk pregnancy, unspecified, unspecified trimester: Secondary | ICD-10-CM

## 2021-12-19 NOTE — Progress Notes (Signed)
Needs US OB follow up

## 2021-12-19 NOTE — Progress Notes (Signed)
Subjective:   Paula [name] is a 31 y.o. G3P1011 at [redacted]w[redacted]d by early ultrasound being seen today for her first obstetrical visit.  Her obstetrical history is significant for obesity and history of postpartum respiratory failure requiring O2 and lasix . Patient does intend to breast feed. Pregnancy history fully reviewed.  Patient reports no complaints.  HISTORY: OB History  Gravida Para Term Preterm AB Living  3 1 1  0 1 1  SAB IAB Ectopic Multiple Live Births  1 0 0 0 1    # Outcome Date GA Lbr Len/2nd Weight Sex Delivery Anes PTL Lv  3 Current           2 Term 05/28/16 [redacted]w[redacted]d  5 lb 1.1 oz (2.3 kg) F CS-LTranv EPI  LIV     Name: Opperman,GIRL Elke     Apgar1: 6  Apgar5: 9  1 SAB             Obstetric Comments  "Chemical preg"  C/s: "placenta started to detach"     Last pap smear: No results found for: DIAGPAP, HPV, HPVHIGH Last pap 01/2017, NILM Due for repeat  Past Medical History:  Diagnosis Date   Chlamydia 2011   Hypertension    Gestatoinal   Medical history non-contributory    Past Surgical History:  Procedure Laterality Date   CESAREAN SECTION N/A 05/28/2016   Procedure: CESAREAN SECTION;  Surgeon: 07/29/2016, MD;  Location: [facility] BIRTHING SUIT[facility];  Service: Obstetrics;  Laterality: N/A;   WISDOM TOOTH EXTRACTION     Family History  Problem Relation Age of Onset   Healthy Father    Social History   Tobacco Use   Smoking status: Never   Smokeless tobacco: Never  Vaping Use   Vaping Use: Never used  Substance Use Topics   Alcohol use: Never   Drug use: No   No Known Allergies Current Outpatient Medications on File Prior to Visit  Medication Sig Dispense Refill   Prenatal Vit-Fe Fumarate-FA (PRENATAL VITAMIN PO) Take by mouth.     aspirin EC 81 MG tablet Take 81 mg by mouth daily. Swallow whole.     Ferrous Sulfate (IRON) 325 (65 Fe) MG TABS iron     [DISCONTINUED] albuterol (PROVENTIL) (2.5 MG/3ML) 0.083% nebulizer solution Take 3 mLs (2.5  mg total) by nebulization every 4 (four) hours as needed for wheezing or shortness of breath. 75 mL 12   [DISCONTINUED] levonorgestrel (MIRENA) 20 MCG/24HR IUD 1 each by Intrauterine route once.     No current facility-administered medications on file prior to visit.     Exam   Vitals:   12/19/21 0849  BP: 137/83  Pulse: 88  Weight: 259 lb 4.8 oz (117.6 kg)   Fetal Heart Rate (bpm): 158  System: General: well-developed, well-nourished female in no acute distress   Skin: normal coloration and turgor, no rashes   Neurologic: oriented, normal, negative, normal mood   Extremities: normal strength, tone, and muscle mass, ROM of all joints is normal   HEENT PERRLA, extraocular movement intact and sclera clear, anicteric   Neck supple and no masses   Respiratory:  no respiratory distress      Assessment:   Pregnancy: G3P1011 Patient Active Problem List   Diagnosis Date Noted   Supervision of high risk pregnancy, antepartum 12/19/2021   Carrier of spinal muscular atrophy 12/19/2021   Acute respiratory failure (HCC)    Postpartum complication 06/03/2016   History of cesarean delivery 05/29/2016  Active labor at term 05/28/2016     Plan:  1. Supervision of high risk pregnancy, antepartum Initial labs drawn. Continue prenatal vitamins. Genetic Screening discussed, NIPS: results reviewed. Ultrasound discussed; fetal anatomic survey: ordered. Needs follow up, repots kidneys and spine not visualized previously. Problem list reviewed and updated. The nature of [facility] with multiple MDs and other Advanced Practice Providers was explained to patient; also emphasized that residents, students are part of our team.  2. History of cesarean delivery Chart reviewed, LTCS for NRFHT at 5 cm, abruption noted at time of delivery Subsequently readmitted a week later with respiratory failure, thought to be multifactorial and improved with O2 and  antibiotics Undecided about TOLAC, also considering BTL Will decide at next visit and sign consents  3. Carrier of spinal muscular atrophy Positive screen on outside records Discussed partner testing, already had neg partner testing   Routine obstetric precautions reviewed. Return in 4 weeks (on 01/16/2022) for [facility], ob visit.

## 2021-12-19 NOTE — Patient Instructions (Signed)

## 2021-12-19 NOTE — Addendum Note (Signed)
Addended by: Isabell Jarvis on: 12/19/2021 09:24 AM   Modules accepted: Orders

## 2021-12-26 ENCOUNTER — Other Ambulatory Visit: Payer: Self-pay

## 2021-12-26 ENCOUNTER — Ambulatory Visit: Payer: Medicaid Other | Admitting: *Deleted

## 2021-12-26 ENCOUNTER — Ambulatory Visit: Payer: Medicaid Other | Attending: Family Medicine

## 2021-12-26 ENCOUNTER — Encounter: Payer: Self-pay | Admitting: *Deleted

## 2021-12-26 ENCOUNTER — Other Ambulatory Visit: Payer: Self-pay | Admitting: *Deleted

## 2021-12-26 VITALS — BP 127/70 | HR 89

## 2021-12-26 DIAGNOSIS — Z3689 Encounter for other specified antenatal screening: Secondary | ICD-10-CM

## 2021-12-26 DIAGNOSIS — Z98891 History of uterine scar from previous surgery: Secondary | ICD-10-CM | POA: Diagnosis present

## 2021-12-26 DIAGNOSIS — O0990 Supervision of high risk pregnancy, unspecified, unspecified trimester: Secondary | ICD-10-CM | POA: Insufficient documentation

## 2021-12-26 DIAGNOSIS — Z148 Genetic carrier of other disease: Secondary | ICD-10-CM | POA: Insufficient documentation

## 2021-12-26 DIAGNOSIS — O34219 Maternal care for unspecified type scar from previous cesarean delivery: Secondary | ICD-10-CM

## 2021-12-26 DIAGNOSIS — Z8759 Personal history of other complications of pregnancy, childbirth and the puerperium: Secondary | ICD-10-CM

## 2021-12-26 DIAGNOSIS — Z6841 Body Mass Index (BMI) 40.0 and over, adult: Secondary | ICD-10-CM

## 2022-01-23 ENCOUNTER — Ambulatory Visit (INDEPENDENT_AMBULATORY_CARE_PROVIDER_SITE_OTHER): Payer: Medicaid Other | Admitting: Family Medicine

## 2022-01-23 ENCOUNTER — Other Ambulatory Visit: Payer: Self-pay

## 2022-01-23 ENCOUNTER — Encounter: Payer: Self-pay | Admitting: Family Medicine

## 2022-01-23 VITALS — BP 138/83 | HR 103 | Wt 262.8 lb

## 2022-01-23 DIAGNOSIS — O0990 Supervision of high risk pregnancy, unspecified, unspecified trimester: Secondary | ICD-10-CM | POA: Diagnosis not present

## 2022-01-23 DIAGNOSIS — Z23 Encounter for immunization: Secondary | ICD-10-CM

## 2022-01-23 DIAGNOSIS — J9600 Acute respiratory failure, unspecified whether with hypoxia or hypercapnia: Secondary | ICD-10-CM

## 2022-01-23 DIAGNOSIS — Z98891 History of uterine scar from previous surgery: Secondary | ICD-10-CM

## 2022-01-23 NOTE — Progress Notes (Signed)
? ?  Subjective:  ?Brittanee Ahliya Muska is a 31 y.o. G3P1011 at [redacted]w[redacted]d being seen today for ongoing prenatal care.  She is currently monitored for the following issues for this high-risk pregnancy and has Active labor at term; History of cesarean delivery; Postpartum complication; Acute respiratory failure ([place]); Supervision of high risk pregnancy, antepartum; and Carrier of spinal muscular atrophy on their problem list. ? ?Patient reports no complaints.  Contractions: Not present. Vag. Bleeding: None.  Movement: Present. Denies leaking of fluid.  ? ?The following portions of the patient's history were reviewed and updated as appropriate: allergies, current medications, past family history, past medical history, past social history, past surgical history and problem list. Problem list updated. ? ?Objective:  ? ?Vitals:  ? 01/23/22 0835  ?BP: 138/83  ?Pulse: (!) 103  ?Weight: 262 lb 12.8 oz (119.2 kg)  ? ? ?Fetal Status: Fetal Heart Rate (bpm): 165   Movement: Present    ? ?General:  Alert, oriented and cooperative. Patient is in no acute distress.  ?Skin: Skin is warm and dry. No rash noted.   ?Cardiovascular: Normal heart rate noted  ?Respiratory: Normal respiratory effort, no problems with respiration noted  ?Abdomen: Soft, gravid, appropriate for gestational age. Pain/Pressure: Absent     ?Pelvic: Vag. Bleeding: None     ?Cervical exam deferred        ?Extremities: Normal range of motion.  Edema: None  ?Mental Status: Normal mood and affect. Normal behavior. Normal judgment and thought content.  ? ?Urinalysis:     ? ?Assessment and Plan:  ?Pregnancy: G3P1011 at [redacted]w[redacted]d ? ?1. Supervision of high risk pregnancy, antepartum ?BP and FHR normal ?TDaP today, will return in 2 days for third trimester labs ?Discussed contraception, desires BTL, consent signed today ?- Tdap vaccine greater than or equal to 7yo IM ? ?2. History of cesarean delivery ?Discussed TOLAC, after counseling prefers to have scheduled  RCS+BTL ?Scheduled with myself for 04/01/2022 ? ?3. Acute respiratory failure, unspecified whether with hypoxia or hypercapnia ([place]) ?Chart reviewed, LTCS for NRFHT at 5 cm, abruption noted at time of delivery ?Subsequently readmitted a week later with respiratory failure, thought to be multifactorial and improved with O2 and antibiotics ? ?Preterm labor symptoms and general obstetric precautions including but not limited to vaginal bleeding, contractions, leaking of fluid and fetal movement were reviewed in detail with the patient. ?Please refer to After Visit Summary for other counseling recommendations.  ?Return in 2 weeks (on 02/06/2022) for [facility], ob visit. ? ? ?Clarnce Flock, MD ? ?

## 2022-01-23 NOTE — Patient Instructions (Signed)

## 2022-01-24 ENCOUNTER — Other Ambulatory Visit: Payer: Self-pay | Admitting: General Practice

## 2022-01-24 DIAGNOSIS — O0990 Supervision of high risk pregnancy, unspecified, unspecified trimester: Secondary | ICD-10-CM

## 2022-01-25 ENCOUNTER — Other Ambulatory Visit: Payer: Self-pay

## 2022-01-25 ENCOUNTER — Other Ambulatory Visit: Payer: Medicaid Other

## 2022-01-25 DIAGNOSIS — O0990 Supervision of high risk pregnancy, unspecified, unspecified trimester: Secondary | ICD-10-CM

## 2022-01-26 LAB — HIV ANTIBODY (ROUTINE TESTING W REFLEX): HIV Screen 4th Generation wRfx: NONREACTIVE

## 2022-01-26 LAB — GLUCOSE TOLERANCE, 2 HOURS W/ 1HR
Glucose, 1 hour: 140 mg/dL (ref 70–179)
Glucose, 2 hour: 87 mg/dL (ref 70–152)
Glucose, Fasting: 77 mg/dL (ref 70–91)

## 2022-01-26 LAB — CBC
Hematocrit: 29.8 % — ABNORMAL LOW (ref 34.0–46.6)
Hemoglobin: 10.2 g/dL — ABNORMAL LOW (ref 11.1–15.9)
MCH: 31.6 pg (ref 26.6–33.0)
MCHC: 34.2 g/dL (ref 31.5–35.7)
MCV: 92 fL (ref 79–97)
Platelets: 199 10*3/uL (ref 150–450)
RBC: 3.23 x10E6/uL — ABNORMAL LOW (ref 3.77–5.28)
RDW: 12.9 % (ref 11.7–15.4)
WBC: 9.4 10*3/uL (ref 3.4–10.8)

## 2022-01-26 LAB — RPR: RPR Ser Ql: NONREACTIVE

## 2022-01-28 ENCOUNTER — Encounter: Payer: Self-pay | Admitting: *Deleted

## 2022-01-30 ENCOUNTER — Other Ambulatory Visit: Payer: Self-pay

## 2022-01-30 ENCOUNTER — Ambulatory Visit: Payer: Medicaid Other | Admitting: *Deleted

## 2022-01-30 ENCOUNTER — Other Ambulatory Visit: Payer: Self-pay | Admitting: *Deleted

## 2022-01-30 ENCOUNTER — Ambulatory Visit: Payer: Medicaid Other | Attending: Obstetrics and Gynecology

## 2022-01-30 ENCOUNTER — Encounter: Payer: Self-pay | Admitting: *Deleted

## 2022-01-30 VITALS — BP 120/63 | HR 90

## 2022-01-30 DIAGNOSIS — O283 Abnormal ultrasonic finding on antenatal screening of mother: Secondary | ICD-10-CM | POA: Insufficient documentation

## 2022-01-30 DIAGNOSIS — O09293 Supervision of pregnancy with other poor reproductive or obstetric history, third trimester: Secondary | ICD-10-CM | POA: Insufficient documentation

## 2022-01-30 DIAGNOSIS — O285 Abnormal chromosomal and genetic finding on antenatal screening of mother: Secondary | ICD-10-CM | POA: Diagnosis not present

## 2022-01-30 DIAGNOSIS — Z8759 Personal history of other complications of pregnancy, childbirth and the puerperium: Secondary | ICD-10-CM | POA: Insufficient documentation

## 2022-01-30 DIAGNOSIS — O34219 Maternal care for unspecified type scar from previous cesarean delivery: Secondary | ICD-10-CM

## 2022-01-30 DIAGNOSIS — E669 Obesity, unspecified: Secondary | ICD-10-CM

## 2022-01-30 DIAGNOSIS — Z3689 Encounter for other specified antenatal screening: Secondary | ICD-10-CM

## 2022-01-30 DIAGNOSIS — Z3A30 30 weeks gestation of pregnancy: Secondary | ICD-10-CM | POA: Diagnosis not present

## 2022-01-30 DIAGNOSIS — O99213 Obesity complicating pregnancy, third trimester: Secondary | ICD-10-CM

## 2022-01-30 DIAGNOSIS — Z6841 Body Mass Index (BMI) 40.0 and over, adult: Secondary | ICD-10-CM | POA: Insufficient documentation

## 2022-01-30 DIAGNOSIS — O133 Gestational [pregnancy-induced] hypertension without significant proteinuria, third trimester: Secondary | ICD-10-CM

## 2022-01-30 DIAGNOSIS — Z363 Encounter for antenatal screening for malformations: Secondary | ICD-10-CM | POA: Diagnosis not present

## 2022-02-05 ENCOUNTER — Other Ambulatory Visit: Payer: Self-pay | Admitting: *Deleted

## 2022-02-05 DIAGNOSIS — Z8759 Personal history of other complications of pregnancy, childbirth and the puerperium: Secondary | ICD-10-CM

## 2022-02-05 DIAGNOSIS — Z3689 Encounter for other specified antenatal screening: Secondary | ICD-10-CM

## 2022-02-08 ENCOUNTER — Encounter: Payer: Self-pay | Admitting: *Deleted

## 2022-02-08 ENCOUNTER — Ambulatory Visit (INDEPENDENT_AMBULATORY_CARE_PROVIDER_SITE_OTHER): Payer: Medicaid Other | Admitting: Family Medicine

## 2022-02-08 ENCOUNTER — Other Ambulatory Visit: Payer: Self-pay

## 2022-02-08 VITALS — BP 138/76 | HR 94 | Wt 267.5 lb

## 2022-02-08 DIAGNOSIS — Z98891 History of uterine scar from previous surgery: Secondary | ICD-10-CM

## 2022-02-08 DIAGNOSIS — O99619 Diseases of the digestive system complicating pregnancy, unspecified trimester: Secondary | ICD-10-CM

## 2022-02-08 DIAGNOSIS — J9600 Acute respiratory failure, unspecified whether with hypoxia or hypercapnia: Secondary | ICD-10-CM

## 2022-02-08 DIAGNOSIS — O0990 Supervision of high risk pregnancy, unspecified, unspecified trimester: Secondary | ICD-10-CM

## 2022-02-08 DIAGNOSIS — Z3009 Encounter for other general counseling and advice on contraception: Secondary | ICD-10-CM | POA: Insufficient documentation

## 2022-02-08 DIAGNOSIS — K219 Gastro-esophageal reflux disease without esophagitis: Secondary | ICD-10-CM

## 2022-02-08 MED ORDER — FAMOTIDINE 20 MG PO TABS: 20.0000 mg | ORAL_TABLET | Freq: Two times a day (BID) | ORAL | 5 refills | Status: DC

## 2022-02-08 NOTE — Progress Notes (Signed)
? ?  Subjective:  ?Paula [name] is a 31 y.o. G3P1011 at [redacted]w[redacted]d being seen today for ongoing prenatal care.  She is currently monitored for the following issues for this low-risk pregnancy and has Active labor at term; History of cesarean delivery; Postpartum complication; Acute respiratory failure (HCC); Supervision of high risk pregnancy, antepartum; Carrier of spinal muscular atrophy; and Unwanted fertility on their problem list. ? ?Patient reports no complaints.  Contractions: Not present. Vag. Bleeding: None.  Movement: Present. Denies leaking of fluid.  ? ?The following portions of the patient's history were reviewed and updated as appropriate: allergies, current medications, past family history, past medical history, past social history, past surgical history and problem list. Problem list updated. ? ?Objective:  ? ?Vitals:  ? 02/08/22 1023  ?BP: 138/76  ?Pulse: 94  ?Weight: 267 lb 8 oz (121.3 kg)  ? ? ?Fetal Status: Fetal Heart Rate (bpm): 159   Movement: Present    ? ?General:  Alert, oriented and cooperative. Patient is in no acute distress.  ?Skin: Skin is warm and dry. No rash noted.   ?Cardiovascular: Normal heart rate noted  ?Respiratory: Normal respiratory effort, no problems with respiration noted  ?Abdomen: Soft, gravid, appropriate for gestational age. Pain/Pressure: Absent     ?Pelvic: Vag. Bleeding: None     ?Cervical exam deferred        ?Extremities: Normal range of motion.  Edema: None  ?Mental Status: Normal mood and affect. Normal behavior. Normal judgment and thought content.  ? ?Urinalysis:     ? ?Assessment and Plan:  ?Pregnancy: G3P1011 at [redacted]w[redacted]d ? ?1. Supervision of high risk pregnancy, antepartum ?BP and FHR normal ?Resigned tubal papers today to correct form ?Trial TUMS and pepcid for GERD ? ?2. History of cesarean delivery ?Scheduled for RCS+BTL ? ?3. Unwanted fertility ?Resigned papers today ? ?4. Acute respiratory failure, unspecified whether with hypoxia or hypercapnia  (HCC) ?Chart reviewed, LTCS for NRFHT at 5 cm, abruption noted at time of delivery ?Subsequently readmitted a week later with respiratory failure, thought to be multifactorial and improved with O2 and antibiotics ? ?Preterm labor symptoms and general obstetric precautions including but not limited to vaginal bleeding, contractions, leaking of fluid and fetal movement were reviewed in detail with the patient. ?Please refer to After Visit Summary for other counseling recommendations.  ?Return in 2 weeks (on 02/22/2022). ? ? ?Venora Maples, MD ? ?

## 2022-02-08 NOTE — Patient Instructions (Signed)

## 2022-02-20 ENCOUNTER — Inpatient Hospital Stay (HOSPITAL_COMMUNITY)
Admission: AD | Admit: 2022-02-20 | Discharge: 2022-02-20 | Disposition: A | Discharge status: Home / Self Care | Payer: Medicaid Other | Attending: Obstetrics and Gynecology | Admitting: Obstetrics and Gynecology

## 2022-02-20 ENCOUNTER — Encounter (HOSPITAL_COMMUNITY): Payer: Self-pay | Admitting: Obstetrics and Gynecology

## 2022-02-20 DIAGNOSIS — Z7982 Long term (current) use of aspirin: Secondary | ICD-10-CM | POA: Diagnosis not present

## 2022-02-20 DIAGNOSIS — R0602 Shortness of breath: Secondary | ICD-10-CM

## 2022-02-20 DIAGNOSIS — O99891 Other specified diseases and conditions complicating pregnancy: Secondary | ICD-10-CM | POA: Diagnosis not present

## 2022-02-20 DIAGNOSIS — R079 Chest pain, unspecified: Secondary | ICD-10-CM | POA: Diagnosis not present

## 2022-02-20 DIAGNOSIS — Z3A33 33 weeks gestation of pregnancy: Secondary | ICD-10-CM

## 2022-02-20 DIAGNOSIS — O26893 Other specified pregnancy related conditions, third trimester: Secondary | ICD-10-CM | POA: Insufficient documentation

## 2022-02-20 DIAGNOSIS — Z3689 Encounter for other specified antenatal screening: Secondary | ICD-10-CM

## 2022-02-20 DIAGNOSIS — O99013 Anemia complicating pregnancy, third trimester: Secondary | ICD-10-CM

## 2022-02-20 LAB — CBC WITH DIFFERENTIAL/PLATELET
Abs Immature Granulocytes: 0.05 10*3/uL (ref 0.00–0.07)
Basophils Absolute: 0 10*3/uL (ref 0.0–0.1)
Basophils Relative: 0 %
Eosinophils Absolute: 0 10*3/uL (ref 0.0–0.5)
Eosinophils Relative: 0 %
HCT: 29.2 % — ABNORMAL LOW (ref 36.0–46.0)
Hemoglobin: 9.9 g/dL — ABNORMAL LOW (ref 12.0–15.0)
Immature Granulocytes: 1 %
Lymphocytes Relative: 17 %
Lymphs Abs: 1.5 10*3/uL (ref 0.7–4.0)
MCH: 31.5 pg (ref 26.0–34.0)
MCHC: 33.9 g/dL (ref 30.0–36.0)
MCV: 93 fL (ref 80.0–100.0)
Monocytes Absolute: 0.8 10*3/uL (ref 0.1–1.0)
Monocytes Relative: 9 %
Neutro Abs: 6.7 10*3/uL (ref 1.7–7.7)
Neutrophils Relative %: 73 %
Platelets: 193 10*3/uL (ref 150–400)
RBC: 3.14 MIL/uL — ABNORMAL LOW (ref 3.87–5.11)
RDW: 13.6 % (ref 11.5–15.5)
WBC: 9.1 10*3/uL (ref 4.0–10.5)
nRBC: 0 % (ref 0.0–0.2)

## 2022-02-20 LAB — COMPREHENSIVE METABOLIC PANEL
ALT: 15 U/L (ref 0–44)
AST: 16 U/L (ref 15–41)
Albumin: 2.5 g/dL — ABNORMAL LOW (ref 3.5–5.0)
Alkaline Phosphatase: 102 U/L (ref 38–126)
Anion gap: 8 (ref 5–15)
BUN: 5 mg/dL — ABNORMAL LOW (ref 6–20)
CO2: 21 mmol/L — ABNORMAL LOW (ref 22–32)
Calcium: 8.7 mg/dL — ABNORMAL LOW (ref 8.9–10.3)
Chloride: 105 mmol/L (ref 98–111)
Creatinine, Ser: 0.56 mg/dL (ref 0.44–1.00)
GFR, Estimated: >60 mL/min (ref >60)
Glucose, Bld: 84 mg/dL (ref 70–99)
Potassium: 3.4 mmol/L — ABNORMAL LOW (ref 3.5–5.1)
Sodium: 134 mmol/L — ABNORMAL LOW (ref 135–145)
Total Bilirubin: 0.5 mg/dL (ref 0.3–1.2)
Total Protein: 6.1 g/dL — ABNORMAL LOW (ref 6.5–8.1)

## 2022-02-20 LAB — URINALYSIS, ROUTINE W REFLEX MICROSCOPIC
Bilirubin Urine: NEGATIVE
Glucose, UA: NEGATIVE mg/dL
Hgb urine dipstick: NEGATIVE
Ketones, ur: NEGATIVE mg/dL
Leukocytes,Ua: NEGATIVE
Nitrite: NEGATIVE
Protein, ur: NEGATIVE mg/dL
Specific Gravity, Urine: 1.02 (ref 1.005–1.030)
pH: 6.5 (ref 5.0–8.0)

## 2022-02-20 NOTE — MAU Note (Addendum)
MAU registration called from the lobby and stated patient was experiencing chest pressure and shortness of breath. CNM made aware. Verbal order for EKG made. Patient brought back to room 126 and EKG performed immediately.  ? ?.Marland KitchenMarland KitchenCharese Indea [name] is a 31 y.o. at [redacted]w[redacted]d here in MAU reporting: Mid chest discomfort with lying on her side that has been ongoing for two weeks now. She states she initially thought her discomfort was related to her pregnancy but states she is now unsure. She states the discomfort is mainly when she is lying down and it causes her to feel short of breath until she repositions. She states when this pain occurs she also feels the pain in her mid back. +FM. No VB or LOF. ? ?She states she called her OB this morning and they instructed her to come to MAU. ? ?Pain score: Denies current pain. ? ?FHT: 135 initial external ?Lab orders placed from triage: UA ? ?

## 2022-02-20 NOTE — MAU Provider Note (Signed)
?History  ?  ? ?CSN: [id number] ? ?Arrival date and time: 02/20/22 0843 ? ? Event Date/Time  ? First Provider Initiated Contact with Patient 02/20/22 0915   ?  ? ?Chief Complaint  ?Patient presents with  ? Shortness of Breath  ? Chest Pain  ? ?Paula [name] is a 31 y.o. G3P1011 at 4238w4d [facility]  She presents today for Shortness of Breath and Chest Pain.  States her symptoms have been intermittently for the past 2 weeks.  She states the symptoms present at HS, while laying down on her left side in bed.  She states she starts to experience pressure and tightness in the middle of her chest and will turn to her back that causes SOB.  She reports it was "happening for a little while, but last night it was hard to sleep."  She states the pressure causes a "heaviness" in her chest, but no pain.  She states the SOB worsens that feeling of heaviness.  She denies current symptoms or pain.  She endorses fetal movement and denies abdominal cramping or contractions.  She endorses a history of heartburn/GERD, but is not on medications.  Patient also endorses history of gHTN and anemia and currently takes iron supplement and bASA.  ? ? ?OB History   ? ? Gravida  ?3  ? Para  ?1  ? Term  ?1  ? Preterm  ?   ? AB  ?1  ? Living  ?1  ?  ? ? SAB  ?1  ? IAB  ?   ? Ectopic  ?   ? Multiple  ?0  ? Live Births  ?1  ?   ?  ? Obstetric Comments  ?"Chemical preg" ?C/s: "placenta started to detach"  ?  ? ?  ? ? ?Past Medical History:  ?Diagnosis Date  ? Chlamydia 2011  ? Hypertension   ? Gestatoinal  ? Medical history non-contributory   ? ? ?Past Surgical History:  ?Procedure Laterality Date  ? CESAREAN SECTION N/A 05/28/2016  ? Procedure: CESAREAN SECTION;  Surgeon: Silverio LaySandra Rivard, MD;  [facility];  Service: Obstetrics;  Laterality: N/A;  ? WISDOM TOOTH EXTRACTION    ? ? ?Family History  ?Problem Relation Age of Onset  ? Healthy Father   ? ? ?Social History  ? ?Tobacco Use  ? Smoking status: Never  ?  Smokeless tobacco: Never  ?Vaping Use  ? Vaping Use: Never used  ?Substance Use Topics  ? Alcohol use: Never  ? Drug use: No  ? ? ?Allergies: No Known Allergies ? ?Medications Prior to Admission  ?Medication Sig Dispense Refill Last Dose  ? aspirin EC 81 MG tablet Take 81 mg by mouth daily. Swallow whole.   02/19/2022  ? Prenatal Vit-Fe Fumarate-FA (PRENATAL VITAMIN PO) Take by mouth.   02/19/2022  ? famotidine (PEPCID) 20 MG tablet Take 1 tablet (20 mg total) by mouth 2 (two) times daily. 60 tablet 5   ? Ferrous Sulfate (IRON) 325 (65 Fe) MG TABS iron     ? ? ?Review of Systems  ?Constitutional:  Negative for chills and fever.  ?Eyes:  Negative for visual disturbance.  ?Respiratory:  Positive for shortness of breath (None currently). Negative for cough and chest tightness.   ?Gastrointestinal:  Negative for abdominal pain, nausea and vomiting.  ?Genitourinary:  Negative for difficulty urinating, dysuria, vaginal bleeding and vaginal discharge.  ?Neurological:  Negative for dizziness, light-headedness and headaches.  ?Physical Exam  ? ?  Blood pressure 130/73, pulse 100, temperature 98.3 ?F (36.8 ?C), temperature source Oral, resp. rate 17, last menstrual period 06/08/2021, SpO2 99 %, unknown if currently breastfeeding. ? ?Physical Exam ?Constitutional:   ?   Appearance: Normal appearance. She is well-developed.  ?HENT:  ?   Head: Normocephalic and atraumatic.  ?Eyes:  ?   Conjunctiva/sclera: Conjunctivae normal.  ?Cardiovascular:  ?   Rate and Rhythm: Normal rate and regular rhythm.  ?   Heart sounds: Normal heart sounds.  ?Pulmonary:  ?   Effort: Pulmonary effort is normal. No tachypnea or accessory muscle usage.  ?   Breath sounds: Normal breath sounds. No decreased breath sounds.  ?Chest:  ?   Chest wall: No mass or tenderness.  ?Abdominal:  ?   Tenderness: There is no abdominal tenderness.  ?   Comments: Gravid, Appears AGA  ?Musculoskeletal:  ?   Cervical back: Normal range of motion.  ?   Right lower leg: No  tenderness. No edema.  ?   Left lower leg: No tenderness. No edema.  ?Skin: ?   General: Skin is dry.  ?Neurological:  ?   Mental Status: She is alert and oriented to person, place, and time.  ?Psychiatric:     ?   Mood and Affect: Mood normal.     ?   Behavior: Behavior normal.  ? ? ?Fetal Assessment ?130 bpm, Mod Var, -Decels, +Accels ?Toco: Irritability ? ?MAU Course  ? ?Results for orders placed or performed during the hospital encounter of 02/20/22 (from the past 24 hour(s))  ?Urinalysis, Routine w reflex microscopic Urine, Clean Catch     Status: None  ? Collection Time: 02/20/22  9:16 AM  ?Result Value Ref Range  ? Color, Urine YELLOW YELLOW  ? APPearance CLEAR CLEAR  ? Specific Gravity, Urine 1.020 1.005 - 1.030  ? pH 6.5 5.0 - 8.0  ? Glucose, UA NEGATIVE NEGATIVE mg/dL  ? Hgb urine dipstick NEGATIVE NEGATIVE  ? Bilirubin Urine NEGATIVE NEGATIVE  ? Ketones, ur NEGATIVE NEGATIVE mg/dL  ? Protein, ur NEGATIVE NEGATIVE mg/dL  ? Nitrite NEGATIVE NEGATIVE  ? Leukocytes,Ua NEGATIVE NEGATIVE  ?CBC with Differential/Platelet     Status: Abnormal  ? Collection Time: 02/20/22  9:37 AM  ?Result Value Ref Range  ? WBC 9.1 4.0 - 10.5 K/uL  ? RBC 3.14 (L) 3.87 - 5.11 MIL/uL  ? Hemoglobin 9.9 (L) 12.0 - 15.0 g/dL  ? HCT 29.2 (L) 36.0 - 46.0 %  ? MCV 93.0 80.0 - 100.0 fL  ? MCH 31.5 26.0 - 34.0 pg  ? MCHC 33.9 30.0 - 36.0 g/dL  ? RDW 13.6 11.5 - 15.5 %  ? Platelets 193 150 - 400 K/uL  ? nRBC 0.0 0.0 - 0.2 %  ? Neutrophils Relative % 73 %  ? Neutro Abs 6.7 1.7 - 7.7 K/uL  ? Lymphocytes Relative 17 %  ? Lymphs Abs 1.5 0.7 - 4.0 K/uL  ? Monocytes Relative 9 %  ? Monocytes Absolute 0.8 0.1 - 1.0 K/uL  ? Eosinophils Relative 0 %  ? Eosinophils Absolute 0.0 0.0 - 0.5 K/uL  ? Basophils Relative 0 %  ? Basophils Absolute 0.0 0.0 - 0.1 K/uL  ? Immature Granulocytes 1 %  ? Abs Immature Granulocytes 0.05 0.00 - 0.07 K/uL  ?Comprehensive metabolic panel     Status: Abnormal  ? Collection Time: 02/20/22  9:37 AM  ?Result Value Ref Range   ? Sodium 134 (L) 135 - 145 mmol/L  ? Potassium  3.4 (L) 3.5 - 5.1 mmol/L  ? Chloride 105 98 - 111 mmol/L  ? CO2 21 (L) 22 - 32 mmol/L  ? Glucose, Bld 84 70 - 99 mg/dL  ? BUN 5 (L) 6 - 20 mg/dL  ? Creatinine, Ser 0.56 0.44 - 1.00 mg/dL  ? Calcium 8.7 (L) 8.9 - 10.3 mg/dL  ? Total Protein 6.1 (L) 6.5 - 8.1 g/dL  ? Albumin 2.5 (L) 3.5 - 5.0 g/dL  ? AST 16 15 - 41 U/L  ? ALT 15 0 - 44 U/L  ? Alkaline Phosphatase 102 38 - 126 U/L  ? Total Bilirubin 0.5 0.3 - 1.2 mg/dL  ? GFR, Estimated >60 >60 mL/min  ? Anion gap 8 5 - 15  ? ?No results found. ? ?MDM ?PE ?Labs: CBC/D, BMP, UA ?EFM ?EKG ?Referral ?Assessment and Plan  ?31 year old G3P1011  ?SIUP at 33.4 weeks ?Cat I FT ?Chest Pain at Rest ?SOB at Rest ? ?-POC Reviewed. ?-Exam performed and findings discussed. ?-ED EKG ordered and reviewed by C. Mathis Fare, MD who reports normal. ?-Labs ordered. ?-NST Reactive ?-Reviewed potential causes for SOB/Chest Pain including pregnancy, anemia, and weight. ?-Discussed recommendation for referral to Cardio OB for further evaluation. Patient agreeable.  ?-Will await results ? ?Cherre Robins MSN, CNM ?02/20/2022, 9:15 AM  ? ?Reassessment (10:31 AM) ? ?-Results as above. ?-Discussed how anemia can cause some SOB. ?-Encouraged continued usage of iron supplement QOD.  Discussed potential for IV iron infusion as appropriate. ?-Encouraged to speak with primary provider regarding this intervention.  ?-Referral for Cardio OB placed. ?-Instructed to keep next appt as scheduled for Friday March 31st. ?-Encouraged to call primary office or return to MAU if symptoms worsen or with the onset of new symptoms. ?-Discharged to home in stable condition. ? ?Cherre Robins MSN, CNM ?Systems developer, [facility] [employer] ? ? ?

## 2022-02-22 ENCOUNTER — Encounter: Payer: Self-pay | Admitting: Medical

## 2022-02-22 ENCOUNTER — Ambulatory Visit (INDEPENDENT_AMBULATORY_CARE_PROVIDER_SITE_OTHER): Payer: Medicaid Other | Admitting: Medical

## 2022-02-22 VITALS — BP 133/73 | HR 87 | Wt 268.7 lb

## 2022-02-22 DIAGNOSIS — O0990 Supervision of high risk pregnancy, unspecified, unspecified trimester: Secondary | ICD-10-CM

## 2022-02-22 DIAGNOSIS — Z3009 Encounter for other general counseling and advice on contraception: Secondary | ICD-10-CM

## 2022-02-22 DIAGNOSIS — Z148 Genetic carrier of other disease: Secondary | ICD-10-CM

## 2022-02-22 DIAGNOSIS — Z98891 History of uterine scar from previous surgery: Secondary | ICD-10-CM

## 2022-02-22 DIAGNOSIS — O321XX0 Maternal care for breech presentation, not applicable or unspecified: Secondary | ICD-10-CM

## 2022-02-22 DIAGNOSIS — Z3A33 33 weeks gestation of pregnancy: Secondary | ICD-10-CM

## 2022-02-22 NOTE — Progress Notes (Signed)
? ?  PRENATAL VISIT NOTE ? ?Subjective:  ?Paula [name] is a 31 y.o. G3P1011 at [redacted]w[redacted]d being seen today for ongoing prenatal care.  Paula [name] is currently monitored for the following issues for this low-risk pregnancy and has History of cesarean delivery; Acute respiratory failure (HCC); Supervision of high risk pregnancy, antepartum; Carrier of spinal muscular atrophy; Unwanted fertility; and Breech presentation on their problem list. ? ?Patient reports no complaints.  Contractions: Not present. Vag. Bleeding: None.  Movement: Present. Denies leaking of fluid.  ? ?The following portions of the patient's history were reviewed and updated as appropriate: allergies, current medications, past family history, past medical history, past social history, past surgical history and problem list.  ? ?Objective:  ? ?Vitals:  ? 02/22/22 0852  ?BP: 133/73  ?Pulse: 87  ?Weight: 268 lb 11.2 oz (121.9 kg)  ? ? ?Fetal Status: Fetal Heart Rate (bpm): 140   Movement: Present    ? ?General:  Alert, oriented and cooperative. Patient is in no acute distress.  ?Skin: Skin is warm and dry. No rash noted.   ?Cardiovascular: Normal heart rate noted  ?Respiratory: Normal respiratory effort, no problems with respiration noted  ?Abdomen: Soft, gravid, appropriate for gestational age.  Pain/Pressure: Absent     ?Pelvic: Cervical exam deferred        ?Extremities: Normal range of motion.  Edema: Trace  ?Mental Status: Normal mood and affect. Normal behavior. Normal judgment and thought content.  ? ?Assessment and Plan:  ?Pregnancy: G3P1011 at [redacted]w[redacted]d ?1. Supervision of high risk pregnancy, antepartum ?- Growth [place] scheduled 4/5 with BPP ?- Weekly BPP due to BMI per MFM  ? ?2. Breech presentation, single or unspecified fetus ?- C/S scheduled 5/8 with BTL  ? ?3. History of cesarean delivery ?- C/S scheduled 5/8 ? ?4. Unwanted fertility ?- BTL consent signed 02/08/22 ? ?5. Carrier of spinal muscular atrophy ?- Partner tested negative  ? ?6. [redacted] weeks  gestation of pregnancy ? ?Preterm labor symptoms and general obstetric precautions including but not limited to vaginal bleeding, contractions, leaking of fluid and fetal movement were reviewed in detail with the patient. ?Please refer to After Visit Summary for other counseling recommendations.  ? ?Return in about 2 weeks (around 03/08/2022) for [facility] APP, In-Person, any provider. ? ?Future Appointments  ?Date Time Provider Department Center  ?02/27/2022 10:15 AM [facility] NURSE [facility]  ?02/27/2022 10:30 AM [facility] US3 [facility]  ?03/01/2022  2:20 PM Shari Prows, Kathlynn Grate, MD [facility] None  ? ? ?Vonzella Nipple, PA-C ? ?

## 2022-02-27 ENCOUNTER — Ambulatory Visit: Payer: Medicaid Other | Admitting: *Deleted

## 2022-02-27 ENCOUNTER — Encounter: Payer: Self-pay | Admitting: *Deleted

## 2022-02-27 ENCOUNTER — Ambulatory Visit: Payer: Medicaid Other | Attending: Maternal & Fetal Medicine

## 2022-02-27 ENCOUNTER — Other Ambulatory Visit: Payer: Self-pay | Admitting: *Deleted

## 2022-02-27 VITALS — BP 133/61 | HR 98

## 2022-02-27 DIAGNOSIS — Z3A34 34 weeks gestation of pregnancy: Secondary | ICD-10-CM

## 2022-02-27 DIAGNOSIS — O358XX0 Maternal care for other (suspected) fetal abnormality and damage, not applicable or unspecified: Secondary | ICD-10-CM | POA: Diagnosis not present

## 2022-02-27 DIAGNOSIS — O283 Abnormal ultrasonic finding on antenatal screening of mother: Secondary | ICD-10-CM

## 2022-02-27 DIAGNOSIS — Z3689 Encounter for other specified antenatal screening: Secondary | ICD-10-CM | POA: Diagnosis present

## 2022-02-27 DIAGNOSIS — O99213 Obesity complicating pregnancy, third trimester: Secondary | ICD-10-CM | POA: Diagnosis not present

## 2022-02-27 DIAGNOSIS — O133 Gestational [pregnancy-induced] hypertension without significant proteinuria, third trimester: Secondary | ICD-10-CM | POA: Diagnosis present

## 2022-02-27 DIAGNOSIS — O09293 Supervision of pregnancy with other poor reproductive or obstetric history, third trimester: Secondary | ICD-10-CM | POA: Diagnosis not present

## 2022-02-27 DIAGNOSIS — E669 Obesity, unspecified: Secondary | ICD-10-CM

## 2022-02-27 DIAGNOSIS — Z8759 Personal history of other complications of pregnancy, childbirth and the puerperium: Secondary | ICD-10-CM | POA: Insufficient documentation

## 2022-02-27 DIAGNOSIS — O285 Abnormal chromosomal and genetic finding on antenatal screening of mother: Secondary | ICD-10-CM

## 2022-02-27 DIAGNOSIS — Z148 Genetic carrier of other disease: Secondary | ICD-10-CM

## 2022-02-27 DIAGNOSIS — O34219 Maternal care for unspecified type scar from previous cesarean delivery: Secondary | ICD-10-CM

## 2022-02-27 NOTE — Progress Notes (Signed)
?[facility] ? ?New Evaluation ? ?Date:  03/01/2022  ? ?ID:  Paula [name], DOB 01-19-1991, MRN [id number] ? ?PCP:  Pcp, No ?  ?[facility] Providers ?Cardiologist:  None  ?Electrophysiologist:  None      ? ?Referring MD: Gavin Pound, CNM  ? ?Chief Complaint: chest pain and SOB ? ?History of Present Illness:   ? ?Paula [name] is a 31 y.o. female [G3P1011] who is being seen today for the evaluation of chest pain and SOB at the request of Gavin Pound, [place].  ? ?Patient seen in MAU on 02/20/22. Note reviewed. Reported chest heaviness and associated SOB when laying down. Had been going on intermittently for 2 weeks. ECG with NSR, no ischemic changes. She was discharged home with CV follow-up. ? ?The patient is currently [redacted] weeks pregnant. She states that about 1 week ago she was feeling chest heaviness and SOB while laying down. She went to the MAU as detailed above and was found to be anemic. She was started on iron supplementation daily. ? ?Today, the patient states that she continues to have intermittent chest heaviness and some shortness of breath but she thinks this has worsened some with the weather and pollen. No LE edema, orthopnea, PND, lightheadedness, dizziness, syncope, or palpitations. Overall, current pregnancy has been uncomplicated. No gestational DM, HTN or pre-eclampsia. ? ?Notably, with prior pregnancy, she was re-hospitalized after delivery for acute respiratory failure and volume overload. Also with possible with atypical pneumonia. CTA negative for PE. TTE at that time with normal BiV function, normal valves, normal diastolic function. She was treated with IV ABX and lasix with improvement and was discharged home. She did not have any HF symptoms following that episode. ? ?Prior CV Studies Reviewed: ?The following studies were reviewed today: ?TTE 2017: ?Study Conclusions  ? ?- Left ventricle: The cavity size was normal. Wall thickness was  ?  normal. Systolic  function was normal. The estimated ejection  ?  fraction was in the range of 60% to 65%. Wall motion was normal;  ?  there were no regional wall motion abnormalities. Left  ?  ventricular diastolic function parameters were normal.  ? ?Past Medical History:  ?Diagnosis Date  ? Chlamydia 2011  ? Hypertension   ? Gestatoinal  ? Medical history non-contributory   ? ? ?Past Surgical History:  ?Procedure Laterality Date  ? CESAREAN SECTION N/A 05/28/2016  ? Procedure: CESAREAN SECTION;  Surgeon: Delsa Bern, MD;  Location: [place];  Service: Obstetrics;  Laterality: N/A;  ? WISDOM TOOTH EXTRACTION    ?   ? ?OB History   ? ? Gravida  ?3  ? Para  ?1  ? Term  ?1  ? Preterm  ?   ? AB  ?1  ? Living  ?1  ?  ? ? SAB  ?1  ? IAB  ?   ? Ectopic  ?   ? Multiple  ?0  ? Live Births  ?1  ?   ?  ? Obstetric Comments  ?"Chemical preg" ?C/s: "placenta started to detach"  ?  ? ?  ?    ? ? ?Current Medications: ?Current Meds  ?Medication Sig  ? aspirin EC 81 MG tablet Take 81 mg by mouth daily. Swallow whole.  ? famotidine (PEPCID) 20 MG tablet Take 1 tablet (20 mg total) by mouth 2 (two) times daily.  ? Ferrous Sulfate (IRON) 325 (65 Fe) MG TABS iron  ? Prenatal Vit-Fe Fumarate-FA (PRENATAL VITAMIN PO)  Take by mouth.  ?  ? ?Allergies:   Patient has no known allergies.  ? ?Social History  ? ?Socioeconomic History  ? Marital status: Married  ?  Spouse name: Not on file  ? Number of children: Not on file  ? Years of education: Not on file  ? Highest education level: Not on file  ?Occupational History  ? Not on file  ?Tobacco Use  ? Smoking status: Never  ? Smokeless tobacco: Never  ?Vaping Use  ? Vaping Use: Never used  ?Substance and Sexual Activity  ? Alcohol use: Never  ? Drug use: No  ? Sexual activity: Not Currently  ?Other Topics Concern  ? Not on file  ?Social History Narrative  ? Not on file  ? ?Social Determinants of Health  ? ?Financial Resource Strain: Not on file  ?Food Insecurity: No Food Insecurity  ? Worried About  Charity fundraiser in the Last Year: Never true  ? Ran Out of Food in the Last Year: Never true  ?Transportation Needs: No Transportation Needs  ? Lack of Transportation (Medical): No  ? Lack of Transportation (Non-Medical): No  ?Physical Activity: Not on file  ?Stress: Not on file  ?Social Connections: Not on file  ?  ? ? ?Family History  ?Problem Relation Age of Onset  ? Healthy Father   ?   ? ?ROS:   ?Please see the history of present illness.    ?Review of Systems  ?Constitutional:  Positive for malaise/fatigue.  ?Respiratory:  Positive for shortness of breath.   ?Cardiovascular:  Positive for chest pain. Negative for palpitations, orthopnea, claudication, leg swelling and PND.  ?Neurological:  Negative for dizziness and loss of consciousness.   ? ? ?Labs/EKG Reviewed:   ? ?EKG:   ?EKG 02/21/22 with NSR with HR 90 ? ?Recent Labs: ?02/20/2022: ALT 15; BUN 5; Creatinine, Ser 0.56; Hemoglobin 9.9; Platelets 193; Potassium 3.4; Sodium 134  ? ?Recent Lipid Panel ?No results found for: CHOL, TRIG, HDL, CHOLHDL, LDLCALC, LDLDIRECT ? ?Physical Exam:   ? ?VS:  BP 120/76   Pulse 91   Ht 5\' 4"  (1.626 m)   Wt 271 lb (122.9 kg)   LMP 06/08/2021 Comment: +HPT, neg at [place] today  SpO2 96%   BMI 46.52 kg/m?    ? ?Wt Readings from Last 3 Encounters:  ?03/01/22 271 lb (122.9 kg)  ?02/22/22 268 lb 11.2 oz (121.9 kg)  ?02/08/22 267 lb 8 oz (121.3 kg)  ?  ? ?GEN:  Well nourished, well developed in no acute distress ?HEENT: Normal ?NECK: No JVD; No carotid bruits ?CARDIAC: RRR, soft flow murmur ?RESPIRATORY:  Clear to auscultation without rales, wheezing or rhonchi  ?ABDOMEN: Gravid, nontender ?MUSCULOSKELETAL:  No edema; No deformity  ?SKIN: Warm and dry ?NEUROLOGIC:  Alert and oriented x 3 ?PSYCHIATRIC:  Normal affect  ? ? ?Risk Assessment/Risk Calculators:   ?{ ? ?CARPREG II ?Risk Prediction Index Score:  2.  The patient's risk for a primary cardiac event is 10%. ?{ ?  ? ? ?ASSESSMENT & PLAN:   ? ?#Chest  Pain: ?#SOB: ?Patient reports intermittent chest heaviness and shortness of breath that has been ongoing for the past several weeks. She presented to MAU as detailed above where ECG with NSR, no ischemic changes. Notably, has prior history of acute respiratory failure following her last pregnancy with volume overload and possible atypical pneumonia requiring hospitalization, diuresis and ABX at that time. TTE 2017 with normal BiV function and no significant  valve disease. Given symptoms and history, will repeat TTE for monitoring.  ?-Check TTE ? ? ?Patient Instructions  ?Medication Instructions:  ? ?Your physician recommends that you continue on your current medications as directed. Please refer to the Current Medication list given to you today. ? ?*If you need a refill on your cardiac medications before your next appointment, please call your pharmacy* ? ? ? ?Testing/Procedures: ? ?Your physician has requested that you have an echocardiogram. Echocardiography is a painless test that uses sound waves to create images of your heart. It provides your doctor with information about the size and shape of your heart and how well your heart?s chambers and valves are working. This procedure takes approximately one hour. There are no restrictions for this procedure. Cardiac-OB patient: to be performed by [place] or [place].  ? ? ? ?Follow-Up: ? ?2 WEEKS HERE AT [place] KARDIE TOBB DO ?}  ? ?Other Instructions ? ?PLEASE KEEP A CLOSE EYE OUT ON YOUR BLOOD PRESSURE DAILY.  YOU CAN TAKE THIS AND LOG IT TO KEEP TRACK OF IT ? ? ? ?Dispo:  No follow-ups on file.  ? ?Medication Adjustments/Labs and Tests Ordered: ?Current medicines are reviewed at length with the patient today.  Concerns regarding medicines are outlined above.  ?Tests Ordered: ?Orders Placed This Encounter  ?Procedures  ? ECHOCARDIOGRAM COMPLETE  ? ?Medication Changes: ?No orders of the defined types were placed in this  encounter. ?  ?

## 2022-03-01 ENCOUNTER — Ambulatory Visit (INDEPENDENT_AMBULATORY_CARE_PROVIDER_SITE_OTHER): Payer: Medicaid Other | Admitting: Cardiology

## 2022-03-01 ENCOUNTER — Encounter: Payer: Self-pay | Admitting: Cardiology

## 2022-03-01 VITALS — BP 120/76 | HR 91 | Ht 64.0 in | Wt 271.0 lb

## 2022-03-01 DIAGNOSIS — R072 Precordial pain: Secondary | ICD-10-CM

## 2022-03-01 DIAGNOSIS — R0602 Shortness of breath: Secondary | ICD-10-CM

## 2022-03-01 NOTE — Patient Instructions (Signed)
Medication Instructions:  ? ?Your physician recommends that you continue on your current medications as directed. Please refer to the Current Medication list given to you today. ? ?*If you need a refill on your cardiac medications before your next appointment, please call your pharmacy* ? ? ? ?Testing/Procedures: ? ?Your physician has requested that you have an echocardiogram. Echocardiography is a painless test that uses sound waves to create images of your heart. It provides your doctor with information about the size and shape of your heart and how well your heart?s chambers and valves are working. This procedure takes approximately one hour. There are no restrictions for this procedure. Cardiac-OB patient: to be performed by [place] or [place].  ? ? ? ?Follow-Up: ? ?2 WEEKS HERE AT [facility] WITH DR. Shari Prows OR KARDIE TOBB DO ?}  ? ?Other Instructions ? ?PLEASE KEEP A CLOSE EYE OUT ON YOUR BLOOD PRESSURE DAILY.  YOU CAN TAKE THIS AND LOG IT TO KEEP TRACK OF IT ? ?

## 2022-03-02 ENCOUNTER — Other Ambulatory Visit: Payer: Self-pay

## 2022-03-02 ENCOUNTER — Encounter (HOSPITAL_COMMUNITY): Payer: Self-pay | Admitting: Obstetrics and Gynecology

## 2022-03-02 ENCOUNTER — Inpatient Hospital Stay (HOSPITAL_COMMUNITY)
Admission: AD | Admit: 2022-03-02 | Discharge: 2022-03-02 | Disposition: A | Discharge status: Home / Self Care | Payer: Medicaid Other | Source: Ambulatory Visit | Attending: Obstetrics and Gynecology | Admitting: Obstetrics and Gynecology

## 2022-03-02 DIAGNOSIS — G44209 Tension-type headache, unspecified, not intractable: Secondary | ICD-10-CM

## 2022-03-02 DIAGNOSIS — O10913 Unspecified pre-existing hypertension complicating pregnancy, third trimester: Secondary | ICD-10-CM | POA: Diagnosis not present

## 2022-03-02 DIAGNOSIS — Z3A35 35 weeks gestation of pregnancy: Secondary | ICD-10-CM | POA: Diagnosis not present

## 2022-03-02 DIAGNOSIS — O26893 Other specified pregnancy related conditions, third trimester: Secondary | ICD-10-CM | POA: Diagnosis not present

## 2022-03-02 DIAGNOSIS — O0990 Supervision of high risk pregnancy, unspecified, unspecified trimester: Secondary | ICD-10-CM

## 2022-03-02 DIAGNOSIS — O133 Gestational [pregnancy-induced] hypertension without significant proteinuria, third trimester: Secondary | ICD-10-CM | POA: Diagnosis not present

## 2022-03-02 DIAGNOSIS — R110 Nausea: Secondary | ICD-10-CM | POA: Diagnosis not present

## 2022-03-02 DIAGNOSIS — R519 Headache, unspecified: Secondary | ICD-10-CM | POA: Insufficient documentation

## 2022-03-02 DIAGNOSIS — Z8759 Personal history of other complications of pregnancy, childbirth and the puerperium: Secondary | ICD-10-CM

## 2022-03-02 DIAGNOSIS — O139 Gestational [pregnancy-induced] hypertension without significant proteinuria, unspecified trimester: Secondary | ICD-10-CM

## 2022-03-02 LAB — PROTEIN / CREATININE RATIO, URINE
Creatinine, Urine: 286.95 mg/dL
Protein Creatinine Ratio: 0.22 mg/mg{Cre} — ABNORMAL HIGH (ref 0.00–0.15)
Total Protein, Urine: 63 mg/dL

## 2022-03-02 LAB — URINALYSIS, ROUTINE W REFLEX MICROSCOPIC
Bilirubin Urine: NEGATIVE
Glucose, UA: NEGATIVE mg/dL
Hgb urine dipstick: NEGATIVE
Ketones, ur: NEGATIVE mg/dL
Nitrite: NEGATIVE
Protein, ur: 30 mg/dL — AB
Specific Gravity, Urine: 1.026 (ref 1.005–1.030)
pH: 6 (ref 5.0–8.0)

## 2022-03-02 LAB — COMPREHENSIVE METABOLIC PANEL
ALT: 15 U/L (ref 0–44)
AST: 17 U/L (ref 15–41)
Albumin: 2.5 g/dL — ABNORMAL LOW (ref 3.5–5.0)
Alkaline Phosphatase: 110 U/L (ref 38–126)
Anion gap: 9 (ref 5–15)
BUN: 6 mg/dL (ref 6–20)
CO2: 22 mmol/L (ref 22–32)
Calcium: 8.9 mg/dL (ref 8.9–10.3)
Chloride: 105 mmol/L (ref 98–111)
Creatinine, Ser: 0.67 mg/dL (ref 0.44–1.00)
GFR, Estimated: >60 mL/min (ref >60)
Glucose, Bld: 101 mg/dL — ABNORMAL HIGH (ref 70–99)
Potassium: 3.4 mmol/L — ABNORMAL LOW (ref 3.5–5.1)
Sodium: 136 mmol/L (ref 135–145)
Total Bilirubin: 0.4 mg/dL (ref 0.3–1.2)
Total Protein: 6.1 g/dL — ABNORMAL LOW (ref 6.5–8.1)

## 2022-03-02 LAB — CBC
HCT: 30.1 % — ABNORMAL LOW (ref 36.0–46.0)
Hemoglobin: 9.8 g/dL — ABNORMAL LOW (ref 12.0–15.0)
MCH: 31 pg (ref 26.0–34.0)
MCHC: 32.6 g/dL (ref 30.0–36.0)
MCV: 95.3 fL (ref 80.0–100.0)
Platelets: 194 10*3/uL (ref 150–400)
RBC: 3.16 MIL/uL — ABNORMAL LOW (ref 3.87–5.11)
RDW: 13.5 % (ref 11.5–15.5)
WBC: 9.2 10*3/uL (ref 4.0–10.5)
nRBC: 0 % (ref 0.0–0.2)

## 2022-03-02 MED ORDER — METOCLOPRAMIDE HCL 10 MG PO TABS
10.0000 mg | ORAL_TABLET | Freq: Once | ORAL | Status: AC
  Administered 2022-03-02: 10 mg via ORAL
  Filled 2022-03-02: qty 1

## 2022-03-02 MED ORDER — DEXAMETHASONE SODIUM PHOSPHATE 10 MG/ML IJ SOLN
10.0000 mg | Freq: Once | INTRAMUSCULAR | Status: AC
  Administered 2022-03-02: 10 mg via INTRAVENOUS
  Filled 2022-03-02: qty 1

## 2022-03-02 MED ORDER — LACTATED RINGERS IV SOLN: INTRAVENOUS | Status: DC

## 2022-03-02 MED ORDER — ONDANSETRON 4 MG PO TBDP
4.0000 mg | ORAL_TABLET | Freq: Once | ORAL | Status: AC
  Administered 2022-03-02: 4 mg via ORAL
  Filled 2022-03-02: qty 1

## 2022-03-02 MED ORDER — OXYCODONE HCL 5 MG PO TABS: 5.0000 mg | ORAL_TABLET | Freq: Four times a day (QID) | ORAL | 0 refills | Status: DC | PRN

## 2022-03-02 MED ORDER — OXYCODONE HCL 5 MG PO TABS
5.0000 mg | ORAL_TABLET | Freq: Once | ORAL | Status: AC
  Administered 2022-03-02: 5 mg via ORAL
  Filled 2022-03-02: qty 1

## 2022-03-02 MED ORDER — ACETAMINOPHEN 500 MG PO TABS
1000.0000 mg | ORAL_TABLET | Freq: Once | ORAL | Status: AC
  Administered 2022-03-02: 1000 mg via ORAL
  Filled 2022-03-02: qty 2

## 2022-03-02 MED ORDER — LACTATED RINGERS IV BOLUS
500.0000 mL | Freq: Once | INTRAVENOUS | Status: AC
  Administered 2022-03-02: 500 mL via INTRAVENOUS

## 2022-03-02 MED ORDER — ACETAMINOPHEN 325 MG PO TABS: 650.0000 mg | ORAL_TABLET | Freq: Four times a day (QID) | ORAL | 0 refills | Status: DC | PRN

## 2022-03-02 NOTE — MAU Note (Signed)
Lab requested for lab draw ?

## 2022-03-02 NOTE — Discharge Instructions (Signed)
You came to the MAU because you had a headache. We treated you with medications and fluids and your headache got better. We checked your labs and at this time you do not have evidence of pre-eclampsia. We do think you have gestational hypertension which means we will be moving up your delivery date to 37 weeks. We will message our schedulers to reschedule your surgery. ? ?Please seek medical care if your headache comes back and does not get better with the tylenol and oxycodone. Please also seek medical care if you start having vision changes or your blood pressure is in the 160s or in the 110s. Also seek medical care if you have vaginal bleeding, contractions that are less than 5 minutes apart, feel like your water broke, or feel like your baby is not moving  ? ? ?

## 2022-03-02 NOTE — MAU Note (Signed)
Pt reports headache no longer throbbing-down to 3 on 0-10 pain scale. IV bolus completed rate to 13ml/hr ?

## 2022-03-02 NOTE — MAU Note (Signed)
Paula [name] is a 31 y.o. at [redacted]w[redacted]d here in MAU reporting: was having a HA since yesterday.  Started on left side, now moved across to whole head.  Checked her  BP it was 136/90. Put it in the baby scripts, told her to come in . Did not take anything for the HA.   Denies visual changes, epigastric pain. ? Slight increase in swelling in hands and feet.  Denies abd pain, bleeding or ROM.  Reports +FM ? ?Onset of complaint: yesterday ?Pain score: 6 ?Vitals:  ? 03/02/22 1813  ?BP: (!) 144/70  ?Pulse: 98  ?Resp: 18  ?Temp: 98.4 ?F (36.9 ?C)  ?SpO2: 97%  ?   ?FHT:154 ?Lab orders placed from triage:  urine ?

## 2022-03-02 NOTE — MAU Note (Signed)
Pt reports headache at 4 from 6 on pain scale. Waiting on lab results. Pt denies needs at this time. Review POC. ?

## 2022-03-02 NOTE — MAU Provider Note (Incomplete Revision)
?History  ?  ? ?CSN: [id number] ? ?Arrival date and time: 03/02/22 1759 ? ? Event Date/Time  ? First Provider Initiated Contact with Patient 03/02/22 1853   ?  ? ?Chief Complaint  ?Patient presents with  ? Headache  ? Hypertension  ? ?HPI ?Paula [name] is a 31 y.o. G3P1011 at [redacted]w[redacted]d who presents with a headache for the last 2 days. She reports yesterday she was able to eat, drink and sleep which helped the headache but today that didn't work. She rates the pain a 6/10 and has not tried any medication for the pain. She reports she checked her blood pressure because of the headache and it was elevated. When she put it in [place] and it told her to come in. She denies any visual changes or epigastric pain. She denies any vaginal bleeding or leaking of fluid. Reports normal fetal movement.  ? ?OB History   ? ? Gravida  ?3  ? Para  ?1  ? Term  ?1  ? Preterm  ?   ? AB  ?1  ? Living  ?1  ?  ? ? SAB  ?1  ? IAB  ?   ? Ectopic  ?   ? Multiple  ?0  ? Live Births  ?1  ?   ?  ? Obstetric Comments  ?"Chemical preg" ?C/s: "placenta started to detach"  ?  ? ?  ? ? ?Past Medical History:  ?Diagnosis Date  ? Chlamydia 2011  ? Hypertension   ? Gestatoinal  ? Medical history non-contributory   ? ? ?Past Surgical History:  ?Procedure Laterality Date  ? CESAREAN SECTION N/A 05/28/2016  ? Procedure: CESAREAN SECTION;  Surgeon: Silverio Lay, MD;  Location: [facility] BIRTHING SU[facility];  Service: Obstetrics;  Laterality: N/A;  ? WISDOM TOOTH EXTRACTION    ? ? ?Family History  ?Problem Relation Age of Onset  ? Healthy Father   ? ? ?Social History  ? ?Tobacco Use  ? Smoking status: Never  ? Smokeless tobacco: Never  ?Vaping Use  ? Vaping Use: Never used  ?Substance Use Topics  ? Alcohol use: Never  ? Drug use: No  ? ? ?Allergies: No Known Allergies ? ?Medications Prior to Admission  ?Medication Sig Dispense Refill Last Dose  ? aspirin EC 81 MG tablet Take 81 mg by mouth daily. Swallow whole.     ? famotidine (PEPCID) 20 MG tablet Take 1 tablet  (20 mg total) by mouth 2 (two) times daily. 60 tablet 5   ? Ferrous Sulfate (IRON) 325 (65 Fe) MG TABS iron     ? Prenatal Vit-Fe Fumarate-FA (PRENATAL VITAMIN PO) Take by mouth.     ? ? ?Review of Systems  ?Constitutional: Negative.  Negative for fatigue and fever.  ?HENT: Negative.    ?Respiratory: Negative.  Negative for shortness of breath.   ?Cardiovascular: Negative.  Negative for chest pain.  ?Gastrointestinal: Negative.  Negative for abdominal pain, constipation, diarrhea, nausea and vomiting.  ?Genitourinary: Negative.  Negative for dysuria, vaginal bleeding and vaginal discharge.  ?Neurological:  Positive for headaches. Negative for dizziness.  ?Physical Exam  ? ?Blood pressure (!) 150/72, pulse 100, temperature 98.4 ?F (36.9 ?C), temperature source Oral, resp. rate 18, height 5\' 4"  (1.626 m), weight 123.3 kg, last menstrual period 06/08/2021, SpO2 97 %, unknown if currently breastfeeding. ? ?Patient Vitals for the past 24 hrs: ? BP Temp Temp src Pulse Resp SpO2 Height Weight  ?03/02/22 1935 -- -- -- -- -- 100 % -- --  ?  03/02/22 1931 (!) 155/77 -- -- 2197 -- -- -- --  ?03/02/22 1930 -- -- -- -- -- 97 % -- --  ?03/02/22 1925 -- -- -- -- -- 97 % -- --  ?03/02/22 1920 -- -- -- -- -- 98 % -- --  ?03/02/22 1917 (!) 145/79 -- -- 95 -- -- -- --  ?03/02/22 1915 -- -- -- -- -- 98 % -- --  ?03/02/22 1910 -- -- -- -- -- 98 % -- --  ?03/02/22 1905 -- -- -- -- -- 99 % -- --  ?03/02/22 1900 -- -- -- -- -- 99 % -- --  ?03/02/22 1855 -- -- -- -- -- 97 % -- --  ?03/02/22 1849 -- 98.4 ?F (36.9 ?C) Oral -- -- -- -- --  ?03/02/22 1845 -- -- -- -- -- 98 % -- --  ?03/02/22 1840 -- -- -- -- -- 98 % -- --  ?03/02/22 1837 (!) 150/72 -- -- 100 -- -- -- --  ?03/02/22 1813 (!) 144/70 98.4 ?F (36.9 ?C) Oral 98 18 97 % 5\' 4"  (1.626 m) 123.3 kg  ? ? ? ?Physical Exam ?Vitals and nursing note reviewed.  ?Constitutional:   ?   General: She is not in acute distress. ?   Appearance: She is well-developed.  ?HENT:  ?   Head: Normocephalic.   ?Eyes:  ?   Pupils: Pupils are equal, round, and reactive to light.  ?Cardiovascular:  ?   Rate and Rhythm: Normal rate and regular rhythm.  ?   Heart sounds: Normal heart sounds.  ?Pulmonary:  ?   Effort: Pulmonary effort is normal. No respiratory distress.  ?   Breath sounds: Normal breath sounds.  ?Abdominal:  ?   General: Bowel sounds are normal. There is no distension.  ?   Palpations: Abdomen is soft.  ?   Tenderness: There is no abdominal tenderness.  ?Skin: ?   General: Skin is warm and dry.  ?Neurological:  ?   Mental Status: She is alert and oriented to person, place, and time.  ?   Motor: No abnormal muscle tone.  ?   Coordination: Coordination normal.  ?   Deep Tendon Reflexes: Reflexes are normal and symmetric. Reflexes normal.  ?Psychiatric:     ?   Mood and Affect: Mood normal.     ?   Behavior: Behavior normal.     ?   Thought Content: Thought content normal.     ?   Judgment: Judgment normal.  ? ?Fetal Tracing: ? ?Baseline: 145 ?Variability: moderate ?Accels: 15x15 ?Decels: none ? ?Toco: UI ? ?Reactive NST ?MAU Course  ?Procedures ?No results found for this or any previous visit (from the past 24 hour(s)).  ? ?MDM ?UA ?CBC, CMP, Protein/creat ratio ?Tylenol and Reglan PO ? ?Care turned over to Dr. Ephriam Jenkinsas at 2000.  ?Rolm Bookbinderaroline M Neill, CNM ?03/02/22 ? ? ?8:15 PM ?Received patient from [place] CNM at beginning of my shift. Patient's labs were drawn. Awaiting results. ? ?9:00 PM ?P:C 0.2, LFTs and Platelets normal. Reassessed patient. Still with HA but slightly improved, now 4-5/10 from 6/10. Will give Decadron and Oxycodone now. Discussed with Dr. Alysia PennaErvin. Will give meds for HA, fluids. And also reassess BP ? ?10:04 PM ?HA now improved to a 2/10. BP also improved to 139/60s. Fluids still running and patient has nausea so will try Zofran ?Assessment and Plan  ?gHTN ?Patient previously with elevated BP now meeting criteria for gHTN. Here with HA and labs showed *** ? ?  Next office visit on 4/12 for NST. Will  message scheduler to reschedule CS to 37 weeks and CC Dr. Crissie Reese ?

## 2022-03-02 NOTE — MAU Provider Note (Addendum)
?History  ?  ? ?CSN: [id number] ? ?Arrival date and time: 03/02/22 1759 ? ? Event Date/Time  ? First Provider Initiated Contact with Patient 03/02/22 1853   ?  ? ?Chief Complaint  ?Patient presents with  ? Headache  ? Hypertension  ? ?HPI ?Paula [name] is a 31 y.o. G3P1011 at [redacted]w[redacted]d who presents with a headache for the last 2 days. She reports yesterday she was able to eat, drink and sleep which helped the headache but today that didn't work. She rates the pain a 6/10 and has not tried any medication for the pain. She reports she checked her blood pressure because of the headache and it was elevated. When she put it in [place] and it told her to come in. She denies any visual changes or epigastric pain. She denies any vaginal bleeding or leaking of fluid. Reports normal fetal movement.  ? ?OB History   ? ? Gravida  ?3  ? Para  ?1  ? Term  ?1  ? Preterm  ?   ? AB  ?1  ? Living  ?1  ?  ? ? SAB  ?1  ? IAB  ?   ? Ectopic  ?   ? Multiple  ?0  ? Live Births  ?1  ?   ?  ? Obstetric Comments  ?"Chemical preg" ?C/s: "placenta started to detach"  ?  ? ?  ? ? ?Past Medical History:  ?Diagnosis Date  ? Chlamydia 2011  ? Hypertension   ? Gestatoinal  ? Medical history non-contributory   ? ? ?Past Surgical History:  ?Procedure Laterality Date  ? CESAREAN SECTION N/A 05/28/2016  ? Procedure: CESAREAN SECTION;  Surgeon: Silverio Lay, MD;  Location: [facility] BIRTHING SUIT[facility];  Service: Obstetrics;  Laterality: N/A;  ? WISDOM TOOTH EXTRACTION    ? ? ?Family History  ?Problem Relation Age of Onset  ? Healthy Father   ? ? ?Social History  ? ?Tobacco Use  ? Smoking status: Never  ? Smokeless tobacco: Never  ?Vaping Use  ? Vaping Use: Never used  ?Substance Use Topics  ? Alcohol use: Never  ? Drug use: No  ? ? ?Allergies: No Known Allergies ? ?Medications Prior to Admission  ?Medication Sig Dispense Refill Last Dose  ? aspirin EC 81 MG tablet Take 81 mg by mouth daily. Swallow whole.     ? famotidine (PEPCID) 20 MG tablet Take 1 tablet  (20 mg total) by mouth 2 (two) times daily. 60 tablet 5   ? Ferrous Sulfate (IRON) 325 (65 Fe) MG TABS iron     ? Prenatal Vit-Fe Fumarate-FA (PRENATAL VITAMIN PO) Take by mouth.     ? ? ?Review of Systems  ?Constitutional: Negative.  Negative for fatigue and fever.  ?HENT: Negative.    ?Respiratory: Negative.  Negative for shortness of breath.   ?Cardiovascular: Negative.  Negative for chest pain.  ?Gastrointestinal: Negative.  Negative for abdominal pain, constipation, diarrhea, nausea and vomiting.  ?Genitourinary: Negative.  Negative for dysuria, vaginal bleeding and vaginal discharge.  ?Neurological:  Positive for headaches. Negative for dizziness.  ?Physical Exam  ? ?Blood pressure (!) 150/72, pulse 100, temperature 98.4 ?F (36.9 ?C), temperature source Oral, resp. rate 18, height 5\' 4"  (1.626 m), weight 123.3 kg, last menstrual period 06/08/2021, SpO2 97 %, unknown if currently breastfeeding. ? ?Patient Vitals for the past 24 hrs: ? BP Temp Temp src Pulse Resp SpO2 Height Weight  ?03/02/22 1935 -- -- -- -- -- 100 % -- --  ?  03/02/22 1931 (!) 155/77 -- -- 27 -- -- -- --  ?03/02/22 1930 -- -- -- -- -- 97 % -- --  ?03/02/22 1925 -- -- -- -- -- 97 % -- --  ?03/02/22 1920 -- -- -- -- -- 98 % -- --  ?03/02/22 1917 (!) 145/79 -- -- 95 -- -- -- --  ?03/02/22 1915 -- -- -- -- -- 98 % -- --  ?03/02/22 1910 -- -- -- -- -- 98 % -- --  ?03/02/22 1905 -- -- -- -- -- 99 % -- --  ?03/02/22 1900 -- -- -- -- -- 99 % -- --  ?03/02/22 1855 -- -- -- -- -- 97 % -- --  ?03/02/22 1849 -- 98.4 ?F (36.9 ?C) Oral -- -- -- -- --  ?03/02/22 1845 -- -- -- -- -- 98 % -- --  ?03/02/22 1840 -- -- -- -- -- 98 % -- --  ?03/02/22 1837 (!) 150/72 -- -- 100 -- -- -- --  ?03/02/22 1813 (!) 144/70 98.4 ?F (36.9 ?C) Oral 98 18 97 % 5\' 4"  (1.626 m) 123.3 kg  ? ? ? ?Physical Exam ?Vitals and nursing note reviewed.  ?Constitutional:   ?   General: She is not in acute distress. ?   Appearance: She is well-developed.  ?HENT:  ?   Head: Normocephalic.   ?Eyes:  ?   Pupils: Pupils are equal, round, and reactive to light.  ?Cardiovascular:  ?   Rate and Rhythm: Normal rate and regular rhythm.  ?   Heart sounds: Normal heart sounds.  ?Pulmonary:  ?   Effort: Pulmonary effort is normal. No respiratory distress.  ?   Breath sounds: Normal breath sounds.  ?Abdominal:  ?   General: Bowel sounds are normal. There is no distension.  ?   Palpations: Abdomen is soft.  ?   Tenderness: There is no abdominal tenderness.  ?Skin: ?   General: Skin is warm and dry.  ?Neurological:  ?   Mental Status: She is alert and oriented to person, place, and time.  ?   Motor: No abnormal muscle tone.  ?   Coordination: Coordination normal.  ?   Deep Tendon Reflexes: Reflexes are normal and symmetric. Reflexes normal.  ?Psychiatric:     ?   Mood and Affect: Mood normal.     ?   Behavior: Behavior normal.     ?   Thought Content: Thought content normal.     ?   Judgment: Judgment normal.  ? ?Fetal Tracing: ? ?Baseline: 145 ?Variability: moderate ?Accels: 15x15 ?Decels: none ? ?Toco: UI ? ?Reactive NST ?MAU Course  ?Procedures ?No results found for this or any previous visit (from the past 24 hour(s)).  ? ?MDM ?UA ?CBC, CMP, Protein/creat ratio ?Tylenol and Reglan PO ? ?Care turned over to Dr. at 2000.  ?2001, CNM ?03/02/22 ? ? ?8:15 PM ?Received patient from [place] CNM at beginning of my shift. Patient's labs were drawn. Awaiting results. ? ?9:00 PM ?P:C 0.2, LFTs and Platelets normal. Reassessed patient. Still with HA but slightly improved, now 4-5/10 from 6/10. Will give Decadron and Oxycodone now. Discussed with Dr. 01-08-1985. Will give meds for HA, fluids. And also reassess BP ? ?10:04 PM ?HA now improved to a 2-3/10. BP also improved to 139/60s. Fluids still running and patient has nausea so will try Zofran. Remains asymptomatic. ? ?11:30PM ?HA is ~2-3/10. BP remains mild range. No other neurologic symptoms. Stable for discharge with follow up  in office and reschedule CS to 37  weeks.  ?Assessment and Plan  ?gHTN ?Patient previously with elevated BP now meeting criteria for gHTN. Here with HA which improved with Tylenol, Reglan, Decadron, and Oxycodone. Also received IV fluids. BP initially in the 150s but improved to 130s-140s with treatment of HA. Labs without evidence of pre-E.  ?- discharge home with return precautions including blood pressure ranges as well as symptoms ?- since gHTN (and not cHTN) will hold off on starting medications at this time. ?- messaged [place] to reschedule CS to 37 weeks and CC Dr. Crissie Reese ?- reminded patient of next office visit on 4/12 for NST. ?- dicussed case with Dr. Alysia Penna who is in agreement  ? ? ?Warner Mccreedy, MD, MPH ?OB Fellow, Faculty Practice ? ? ? ?

## 2022-03-03 ENCOUNTER — Telehealth: Payer: Self-pay | Admitting: Obstetrics & Gynecology

## 2022-03-03 NOTE — Telephone Encounter (Signed)
? ? ? ?  Faculty Practice OB/GYN Physician Phone Call Documentation ? ?I had a phone call from [employer] about elevated BP of 141/85 for  Paula [name]. I called patient and there was no answer. I left message for her, told her to call back or come to MAU for any concerning symptoms but that 141/85 was in line with the rest of her recent BPs.  Of note, was had a negative evaluation for preeclamspia in MAU yesterday 03/04/2022. No intervention needed for now, pending obtaining further information.  Preeclampsia, fetal movement and PTL precautions reviewed.  ? ? ?Jaynie Collins, MD, FACOG ?Obstetrician Heritage manager, Faculty Practice ?Center for [employer], [facility] ? ? ?  ?

## 2022-03-05 ENCOUNTER — Ambulatory Visit (HOSPITAL_COMMUNITY): Payer: Medicaid Other | Attending: Cardiology

## 2022-03-05 DIAGNOSIS — R0602 Shortness of breath: Secondary | ICD-10-CM | POA: Diagnosis present

## 2022-03-05 LAB — ECHOCARDIOGRAM COMPLETE
AR max vel: 3.12 cm2
AV Area VTI: 2.99 cm2
AV Area mean vel: 3.13 cm2
AV Mean grad: 9.5 mmHg
AV Peak grad: 17.3 mmHg
Ao pk vel: 2.08 m/s
Area-P 1/2: 4.71 cm2
S' Lateral: 2.25 cm

## 2022-03-06 ENCOUNTER — Ambulatory Visit (INDEPENDENT_AMBULATORY_CARE_PROVIDER_SITE_OTHER): Payer: Medicaid Other | Admitting: Family Medicine

## 2022-03-06 ENCOUNTER — Ambulatory Visit: Payer: Medicaid Other | Admitting: *Deleted

## 2022-03-06 ENCOUNTER — Encounter: Payer: Medicaid Other | Admitting: Obstetrics and Gynecology

## 2022-03-06 ENCOUNTER — Ambulatory Visit (INDEPENDENT_AMBULATORY_CARE_PROVIDER_SITE_OTHER): Payer: Medicaid Other

## 2022-03-06 ENCOUNTER — Telehealth: Payer: Self-pay

## 2022-03-06 VITALS — BP 132/82 | HR 82 | Wt 270.3 lb

## 2022-03-06 DIAGNOSIS — Z98891 History of uterine scar from previous surgery: Secondary | ICD-10-CM

## 2022-03-06 DIAGNOSIS — O321XX0 Maternal care for breech presentation, not applicable or unspecified: Secondary | ICD-10-CM

## 2022-03-06 DIAGNOSIS — O0990 Supervision of high risk pregnancy, unspecified, unspecified trimester: Secondary | ICD-10-CM | POA: Diagnosis not present

## 2022-03-06 DIAGNOSIS — O133 Gestational [pregnancy-induced] hypertension without significant proteinuria, third trimester: Secondary | ICD-10-CM

## 2022-03-06 DIAGNOSIS — Z3A35 35 weeks gestation of pregnancy: Secondary | ICD-10-CM | POA: Diagnosis not present

## 2022-03-06 DIAGNOSIS — O99213 Obesity complicating pregnancy, third trimester: Secondary | ICD-10-CM

## 2022-03-06 DIAGNOSIS — Z3009 Encounter for other general counseling and advice on contraception: Secondary | ICD-10-CM

## 2022-03-06 NOTE — Telephone Encounter (Signed)
Patient left VM on nurse line yesterday requesting a call regarding moving c-section date and cardiology/hypertension. Concerns were addressed at visit today. ?

## 2022-03-06 NOTE — Patient Instructions (Signed)

## 2022-03-06 NOTE — Progress Notes (Signed)
Pt seen @ MAU on 4/8 w/elevated BP and H/A, pre-e labs normal. She reports H/A yesterday - none today, denies visual disturbances. Needs C/S rescheduled to [redacted] wks EGA d/t gHTN.  ?

## 2022-03-06 NOTE — Progress Notes (Signed)
? ?  Subjective:  ?Paula [name] is a 31 y.o. G3P1011 at [redacted]w[redacted]d being seen today for ongoing prenatal care.  She is currently monitored for the following issues for this high-risk pregnancy and has History of cesarean delivery; Acute respiratory failure ([place]); Supervision of high risk pregnancy, antepartum; Carrier of spinal muscular atrophy; Unwanted fertility; Breech presentation; and Gestational hypertension on their problem list. ? ?Patient reports no complaints.  Contractions: Not present. Vag. Bleeding: None.  Movement: Present. Denies leaking of fluid.  ? ?The following portions of the patient's history were reviewed and updated as appropriate: allergies, current medications, past family history, past medical history, past social history, past surgical history and problem list. Problem list updated. ? ?Objective:  ? ?Vitals:  ? 03/06/22 0925  ?BP: 132/82  ?Pulse: 82  ?Weight: 270 lb 4.8 oz (122.6 kg)  ? ? ?Fetal Status: Fetal Heart Rate (bpm): RNST   Movement: Present    ? ?General:  Alert, oriented and cooperative. Patient is in no acute distress.  ?Skin: Skin is warm and dry. No rash noted.   ?Cardiovascular: Normal heart rate noted  ?Respiratory: Normal respiratory effort, no problems with respiration noted  ?Abdomen: Soft, gravid, appropriate for gestational age. Pain/Pressure: Present     ?Pelvic: Vag. Bleeding: None     ?Cervical exam deferred        ?Extremities: Normal range of motion.  Edema: None  ?Mental Status: Normal mood and affect. Normal behavior. Normal judgment and thought content.  ? ?Urinalysis:     ? ?Assessment and Plan:  ?Pregnancy: G3P1011 at [redacted]w[redacted]d ? ?1. Supervision of high risk pregnancy, antepartum ?BP and FHR normal ? ?2. Gestational hypertension, third trimester ?Diagnosed in MAU on 03/02/2022 ?Needs CS at 37 weeks, rescheduled for 03/16/2022 ?BPP normal today, has weekly scheduled until delivery ? ?3. Obesity affecting pregnancy, antepartum, third trimester ? ? ?4. History of  cesarean delivery ?Rescheduled for RCS+BTL on 03/16/2022 ? ?5. Breech presentation, single or unspecified fetus ?Noted on recent ultrasound ? ?6. Unwanted fertility ?Signed 02/08/2022 ? ?Preterm labor symptoms and general obstetric precautions including but not limited to vaginal bleeding, contractions, leaking of fluid and fetal movement were reviewed in detail with the patient. ?Please refer to After Visit Summary for other counseling recommendations.  ?Return in 1 week (on 03/13/2022) for [facility], ob visit. ? ? ?Clarnce Flock, MD ? ?

## 2022-03-07 ENCOUNTER — Encounter (HOSPITAL_COMMUNITY): Payer: Self-pay | Admitting: Obstetrics & Gynecology

## 2022-03-07 ENCOUNTER — Inpatient Hospital Stay (HOSPITAL_COMMUNITY)
Admission: AD | Admit: 2022-03-07 | Discharge: 2022-03-11 | DRG: 784 | Disposition: A | Discharge status: Home / Self Care | Payer: Medicaid Other | Attending: Family Medicine | Admitting: Family Medicine

## 2022-03-07 ENCOUNTER — Telehealth: Payer: Self-pay | Admitting: Family Medicine

## 2022-03-07 ENCOUNTER — Other Ambulatory Visit: Payer: Self-pay

## 2022-03-07 ENCOUNTER — Telehealth: Payer: Self-pay

## 2022-03-07 ENCOUNTER — Encounter (HOSPITAL_COMMUNITY): Payer: Self-pay | Admitting: Obstetrics and Gynecology

## 2022-03-07 DIAGNOSIS — D62 Acute posthemorrhagic anemia: Secondary | ICD-10-CM | POA: Diagnosis not present

## 2022-03-07 DIAGNOSIS — Z302 Encounter for sterilization: Secondary | ICD-10-CM

## 2022-03-07 DIAGNOSIS — O9081 Anemia of the puerperium: Secondary | ICD-10-CM | POA: Diagnosis not present

## 2022-03-07 DIAGNOSIS — O133 Gestational [pregnancy-induced] hypertension without significant proteinuria, third trimester: Secondary | ICD-10-CM | POA: Diagnosis not present

## 2022-03-07 DIAGNOSIS — Z3009 Encounter for other general counseling and advice on contraception: Secondary | ICD-10-CM | POA: Diagnosis present

## 2022-03-07 DIAGNOSIS — Z7982 Long term (current) use of aspirin: Secondary | ICD-10-CM | POA: Diagnosis not present

## 2022-03-07 DIAGNOSIS — O134 Gestational [pregnancy-induced] hypertension without significant proteinuria, complicating childbirth: Secondary | ICD-10-CM | POA: Diagnosis present

## 2022-03-07 DIAGNOSIS — Z148 Genetic carrier of other disease: Secondary | ICD-10-CM | POA: Diagnosis not present

## 2022-03-07 DIAGNOSIS — Z8759 Personal history of other complications of pregnancy, childbirth and the puerperium: Secondary | ICD-10-CM | POA: Diagnosis present

## 2022-03-07 DIAGNOSIS — Z9079 Acquired absence of other genital organ(s): Secondary | ICD-10-CM

## 2022-03-07 DIAGNOSIS — O34211 Maternal care for low transverse scar from previous cesarean delivery: Secondary | ICD-10-CM | POA: Diagnosis present

## 2022-03-07 DIAGNOSIS — O321XX0 Maternal care for breech presentation, not applicable or unspecified: Secondary | ICD-10-CM | POA: Diagnosis present

## 2022-03-07 DIAGNOSIS — O0990 Supervision of high risk pregnancy, unspecified, unspecified trimester: Secondary | ICD-10-CM

## 2022-03-07 DIAGNOSIS — Z98891 History of uterine scar from previous surgery: Secondary | ICD-10-CM

## 2022-03-07 DIAGNOSIS — O42013 Preterm premature rupture of membranes, onset of labor within 24 hours of rupture, third trimester: Secondary | ICD-10-CM | POA: Diagnosis present

## 2022-03-07 DIAGNOSIS — O139 Gestational [pregnancy-induced] hypertension without significant proteinuria, unspecified trimester: Secondary | ICD-10-CM | POA: Diagnosis present

## 2022-03-07 DIAGNOSIS — O42913 Preterm premature rupture of membranes, unspecified as to length of time between rupture and onset of labor, third trimester: Secondary | ICD-10-CM | POA: Diagnosis present

## 2022-03-07 DIAGNOSIS — Z3A35 35 weeks gestation of pregnancy: Secondary | ICD-10-CM

## 2022-03-07 LAB — COMPREHENSIVE METABOLIC PANEL
ALT: 14 U/L (ref 0–44)
AST: 14 U/L — ABNORMAL LOW (ref 15–41)
Albumin: 2.5 g/dL — ABNORMAL LOW (ref 3.5–5.0)
Alkaline Phosphatase: 115 U/L (ref 38–126)
Anion gap: 7 (ref 5–15)
BUN: 5 mg/dL — ABNORMAL LOW (ref 6–20)
CO2: 22 mmol/L (ref 22–32)
Calcium: 8.9 mg/dL (ref 8.9–10.3)
Chloride: 107 mmol/L (ref 98–111)
Creatinine, Ser: 0.6 mg/dL (ref 0.44–1.00)
GFR, Estimated: >60 mL/min (ref >60)
Glucose, Bld: 77 mg/dL (ref 70–99)
Potassium: 3.6 mmol/L (ref 3.5–5.1)
Sodium: 136 mmol/L (ref 135–145)
Total Bilirubin: 0.5 mg/dL (ref 0.3–1.2)
Total Protein: 6.3 g/dL — ABNORMAL LOW (ref 6.5–8.1)

## 2022-03-07 LAB — WET PREP, GENITAL
Clue Cells Wet Prep HPF POC: NONE SEEN
Sperm: NONE SEEN
Trich, Wet Prep: NONE SEEN
WBC, Wet Prep HPF POC: <10 (ref <10)
Yeast Wet Prep HPF POC: NONE SEEN

## 2022-03-07 LAB — CBC
HCT: 30.1 % — ABNORMAL LOW (ref 36.0–46.0)
Hemoglobin: 10 g/dL — ABNORMAL LOW (ref 12.0–15.0)
MCH: 31.5 pg (ref 26.0–34.0)
MCHC: 33.2 g/dL (ref 30.0–36.0)
MCV: 95 fL (ref 80.0–100.0)
Platelets: 198 10*3/uL (ref 150–400)
RBC: 3.17 MIL/uL — ABNORMAL LOW (ref 3.87–5.11)
RDW: 13.5 % (ref 11.5–15.5)
WBC: 8.8 10*3/uL (ref 4.0–10.5)
nRBC: 0 % (ref 0.0–0.2)

## 2022-03-07 LAB — RAPID HIV SCREEN (HIV 1/2 AB+AG)
HIV 1/2 Antibodies: NONREACTIVE
HIV-1 P24 Antigen - HIV24: NONREACTIVE

## 2022-03-07 LAB — TYPE AND SCREEN
ABO/RH(D): O POS
Antibody Screen: NEGATIVE

## 2022-03-07 LAB — AMNISURE RUPTURE OF MEMBRANE (ROM) NOT AT ARMC: Amnisure ROM: POSITIVE

## 2022-03-07 MED ORDER — PRENATAL MULTIVITAMIN CH: 1.0000 | ORAL_TABLET | Freq: Every day | ORAL | Status: DC

## 2022-03-07 MED ORDER — ZOLPIDEM TARTRATE 5 MG PO TABS: 5.0000 mg | ORAL_TABLET | Freq: Every evening | ORAL | Status: DC | PRN

## 2022-03-07 MED ORDER — DOCUSATE SODIUM 100 MG PO CAPS: 100.0000 mg | ORAL_CAPSULE | Freq: Every day | ORAL | Status: DC

## 2022-03-07 MED ORDER — CALCIUM CARBONATE ANTACID 500 MG PO CHEW: 2.0000 | CHEWABLE_TABLET | ORAL | Status: DC | PRN

## 2022-03-07 MED ORDER — ACETAMINOPHEN 325 MG PO TABS
650.0000 mg | ORAL_TABLET | ORAL | Status: DC | PRN
  Administered 2022-03-07: 650 mg via ORAL
  Filled 2022-03-07: qty 2

## 2022-03-07 NOTE — MAU Note (Signed)
Paula [name] is a 31 y.o. at [redacted]w[redacted]d here in MAU reporting: LOF since 1240. Fluid is clear, no recent intercourse. Pt was having lower abdominal pain but not today. No bleeding.  ? ?Onset of complaint: Today ?Pain score: 4 ?Vitals:  ? 03/07/22 1538 03/07/22 1541  ?BP: 133/74   ?Pulse: (!) 105   ?Resp: 19   ?Temp: 98.6 ?F (37 ?C)   ?SpO2:  97%  ?   ?FHT: EFM applied in room    ?Lab orders placed from triage:  UA ?

## 2022-03-07 NOTE — Patient Instructions (Signed)
[place] ? 03/07/2022 ? ? Your procedure is scheduled on:  03/16/2022 ? Arrive at [place] [facility] [place] on [address] at [facility]  and [address]. You are invited to use the FREE valet parking or use the Visitor's parking deck. ? Pick up the phone at the desk and dial [phone]. ? Call this number if you have problems the morning of surgery: [phone] ? Remember: ? ? Do not eat food:(After Midnight) Desp?s de medianoche. ? Do not drink clear liquids: (After Midnight) Desp?s de medianoche. ? Take these medicines the morning of surgery with A SIP OF WATER:  none ? ? Do not wear jewelry, make-up or nail polish. ? Do not wear lotions, powders, or perfumes. Do not wear deodorant. ? Do not shave 48 hours prior to surgery. ? Do not bring valuables to the hospital.  [facility] is not  ? responsible for any belongings or valuables brought to the hospital. ? Contacts, dentures or bridgework may not be worn into surgery. ? Leave suitcase in the car. After surgery it may be brought to your room. ? For patients admitted to the hospital, checkout time is 11:00 AM the day of  ?            discharge. ? ?   ? Please read over the following fact sheets that you were given:  ?   Preparing for Surgery ? ? ?

## 2022-03-07 NOTE — H&P (Addendum)
FACULTY PRACTICE ANTEPARTUM ADMISSION HISTORY AND PHYSICAL NOTE ? ? ?History of Present Illness: ?Paula [name] is a 31 y.o. G3P1011 at [redacted]w[redacted]d admitted for rupture of membranes.   ? ?Earlier today patient had a large gush of clear fluid from her vagina around 1240 ?She has continued to leak clear fluid since then ?No contractions or vaginal bleeding ?Normal fetal movement ? ? ?Patient Active Problem List  ? Diagnosis Date Noted  ? Gestational hypertension 03/02/2022  ? Breech presentation 02/22/2022  ? Unwanted fertility 02/08/2022  ? Supervision of high risk pregnancy, antepartum 12/19/2021  ? Carrier of spinal muscular atrophy 12/19/2021  ? Acute respiratory failure (HCC)   ? History of cesarean delivery 05/29/2016  ? ? ?Past Medical History:  ?Diagnosis Date  ? Chlamydia 2011  ? Hypertension   ? Gestatoinal  ? Medical history non-contributory   ? ? ?Past Surgical History:  ?Procedure Laterality Date  ? CESAREAN SECTION N/A 05/28/2016  ? Procedure: CESAREAN SECTION;  Surgeon: Silverio Lay, MD;  Location: [facility] BIRTHING SU[facility];  Service: Obstetrics;  Laterality: N/A;  ? WISDOM TOOTH EXTRACTION    ? ? ?OB History  ?Gravida Para Term Preterm AB Living  ?3 1 1   1 1   ?SAB IAB Ectopic Multiple Live Births  ?1     0 1  ?  ?# Outcome Date GA Lbr Len/2nd Weight Sex Delivery Anes PTL Lv  ?3 Current           ?2 Term 05/28/16 [redacted]w[redacted]d  2300 g F CS-LTranv EPI  LIV  ?1 SAB           ?  ?Obstetric Comments  ?"Chemical preg"  ?C/s: "placenta started to detach"  ? ? ?Social History  ? ?Socioeconomic History  ? Marital status: Married  ?  Spouse name: Not on file  ? Number of children: Not on file  ? Years of education: Not on file  ? Highest education level: Not on file  ?Occupational History  ? Not on file  ?Tobacco Use  ? Smoking status: Never  ? Smokeless tobacco: Never  ?Vaping Use  ? Vaping Use: Never used  ?Substance and Sexual Activity  ? Alcohol use: Never  ? Drug use: No  ? Sexual activity: Not Currently  ?Other  Topics Concern  ? Not on file  ?Social History Narrative  ? Not on file  ? ?Social Determinants of Health  ? ?Financial Resource Strain: Not on file  ?Food Insecurity: No Food Insecurity  ? Worried About [redacted]w[redacted]d in the Last Year: Never true  ? Ran Out of Food in the Last Year: Never true  ?Transportation Needs: No Transportation Needs  ? Lack of Transportation (Medical): No  ? Lack of Transportation (Non-Medical): No  ?Physical Activity: Not on file  ?Stress: Not on file  ?Social Connections: Not on file  ? ? ?Family History  ?Problem Relation Age of Onset  ? Healthy Father   ? ? ?No Known Allergies ? ?Medications Prior to Admission  ?Medication Sig Dispense Refill Last Dose  ? acetaminophen (TYLENOL) 325 MG tablet Take 2 tablets (650 mg total) by mouth every 6 (six) hours as needed for headache. 60 tablet 0   ? aspirin EC 81 MG tablet Take 81 mg by mouth daily. Swallow whole.     ? famotidine (PEPCID) 20 MG tablet Take 1 tablet (20 mg total) by mouth 2 (two) times daily. 60 tablet 5   ? Ferrous Sulfate (IRON) 325 (65  Fe) MG TABS iron     ? oxyCODONE (ROXICODONE) 5 MG immediate release tablet Take 1 tablet (5 mg total) by mouth every 6 (six) hours as needed (severe headache). (Patient not taking: Reported on 03/06/2022) 5 tablet 0   ? Prenatal Vit-Fe Fumarate-FA (PRENATAL VITAMIN PO) Take by mouth.     ? ? ?Review of Systems  ?Pertinent positives and negative per HPI, all others reviewed and negative ? ? ?Vitals:  BP 133/74 (BP Location: Left Arm)   Pulse (!) 105   Temp 98.6 ?F (37 ?C) (Oral)   Resp 19   Wt 122.5 kg   LMP 06/08/2021 Comment: +[facility]  SpO2 97%   BMI 46.36 kg/m?  ?Physical Examination: ?Physical Exam ?Vitals reviewed.  ?Constitutional:   ?   General: She is not in acute distress. ?   Appearance: She is well-developed. She is not diaphoretic.  ?Cardiovascular:  ?   Rate and Rhythm: Normal rate and regular rhythm.  ?   Heart sounds: Normal heart sounds. No  murmur heard. ?Pulmonary:  ?   Effort: Pulmonary effort is normal. No respiratory distress.  ?   Breath sounds: Normal breath sounds. No wheezing or rales.  ?Abdominal:  ?   General: Bowel sounds are normal. There is no distension.  ?   Palpations: Abdomen is soft.  ?   Tenderness: There is no abdominal tenderness. There is no guarding or rebound.  ?Genitourinary: ?   Comments: Cervix not visualized due to prolapsing tissues, no pooling but very wet appearance suggestive of PPROM ?Skin: ?   General: Skin is warm and dry.  ?Neurological:  ?   Mental Status: She is alert.  ?   Coordination: Coordination normal.  ? ? ?Fetal Monitoring:Baseline: 145 bpm, Variability: Good {> 6 bpm), Accelerations: Reactive, and Decelerations: Absent ?Tocometer: Rare contractions that she does not feel ? ?Labs:  ?Results for orders placed or performed during the hospital encounter of 03/07/22 (from the past 24 hour(s))  ?Wet prep, genital  ? Collection Time: 03/07/22  4:23 PM  ? Specimen: Vaginal  ?Result Value Ref Range  ? Yeast Wet Prep HPF POC NONE SEEN NONE SEEN  ? Trich, Wet Prep NONE SEEN NONE SEEN  ? Clue Cells Wet Prep HPF POC NONE SEEN NONE SEEN  ? WBC, Wet Prep HPF POC <10 <10  ? Sperm NONE SEEN   ?Amnisure rupture of membrane (rom)not at [facility]  ? Collection Time: 03/07/22  4:41 PM  ?Result Value Ref Range  ? Amnisure ROM POSITIVE   ? ? ?Imaging Studies: ?US MFM FETAL BPP WO NON STRESS ? ?Result Date: 02/27/2022 ?----------------------------------------------------------------------  OBSTETRICS REPORT                       (Signed Final 02/27/2022 11:34 am) ---------------------------------------------------------------------- Patient Info  ID #:       [id number]                          D.O.B.:  08-06-1991 (31 yrs)  Name:       Paula [name]             Visit Date: 02/27/2022 10:32 am ---------------------------------------------------------------------- Performed By  Attending:        Lin Landsmanorenthian Booker      Ref. Address:      [address]                                                             [address]                                                             [place], [place]                                                             [address]  Performed By:     Charlyne Petrin     [facility]  Referred By:      Venora Maples MD ---------------------------------------------------------------------- Orders  #  Description                           Code        Ordered By  1  [place] MFM OB FOLLOW UP                   65784.69    Lin Landsman  2  [place] MFM FETAL BPP WO NON               62952.84    CORENTHIAN     STRESS  BOOKER ----------------------------------------------------------------------  #  Order #                     Accession #                Episode #  1  [id number]                   [id number]  2  [id number]                   [id number]                 [id number] ---------------------------------------------------------------------- Indications  Obesity complicating pregnancy, second         O99.212  trimester- BMI 44  Poor obstetrical history - postpartum          O09.299  respiratory failure  Echogenic intracardiac focus of the heart      O35.8XX0  (EIF)  Previous cesarean delivery, antepartum         O34.219  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 Cord Insertion:      Previously seen as normal  Amnioti

## 2022-03-07 NOTE — Telephone Encounter (Signed)
Received message from the front office stating that the pt woke up and had fluid leak from her leg and then a headache afterwards.  Pt reports that the fluid was leaking down her leg.  Pt states that she has been going back and forth to the bathroom and is seeing a watery fluid.  I advised pt to go to MAU for evaluation to make sure that her water has not broken.  Pt verbalized understanding with no further questions.  ? ?Riccardo Holeman,RN  ?03/07/22 ?

## 2022-03-08 ENCOUNTER — Inpatient Hospital Stay (HOSPITAL_COMMUNITY): Payer: Medicaid Other | Admitting: Anesthesiology

## 2022-03-08 ENCOUNTER — Encounter (HOSPITAL_COMMUNITY)
Admission: AD | Disposition: A | Discharge status: Home / Self Care | Payer: Self-pay | Source: Home / Self Care | Attending: Family Medicine

## 2022-03-08 ENCOUNTER — Encounter (HOSPITAL_COMMUNITY): Payer: Self-pay | Admitting: Family Medicine

## 2022-03-08 ENCOUNTER — Inpatient Hospital Stay (HOSPITAL_COMMUNITY)
Admission: RE | Admit: 2022-03-08 | Payer: Medicaid Other | Source: Home / Self Care | Admitting: Obstetrics and Gynecology

## 2022-03-08 DIAGNOSIS — O134 Gestational [pregnancy-induced] hypertension without significant proteinuria, complicating childbirth: Secondary | ICD-10-CM

## 2022-03-08 DIAGNOSIS — O42013 Preterm premature rupture of membranes, onset of labor within 24 hours of rupture, third trimester: Secondary | ICD-10-CM

## 2022-03-08 DIAGNOSIS — Z9079 Acquired absence of other genital organ(s): Secondary | ICD-10-CM

## 2022-03-08 DIAGNOSIS — O34211 Maternal care for low transverse scar from previous cesarean delivery: Secondary | ICD-10-CM

## 2022-03-08 DIAGNOSIS — O42913 Preterm premature rupture of membranes, unspecified as to length of time between rupture and onset of labor, third trimester: Secondary | ICD-10-CM

## 2022-03-08 DIAGNOSIS — Z98891 History of uterine scar from previous surgery: Principal | ICD-10-CM

## 2022-03-08 DIAGNOSIS — Z3A35 35 weeks gestation of pregnancy: Secondary | ICD-10-CM

## 2022-03-08 DIAGNOSIS — Z302 Encounter for sterilization: Secondary | ICD-10-CM

## 2022-03-08 DIAGNOSIS — O321XX0 Maternal care for breech presentation, not applicable or unspecified: Secondary | ICD-10-CM

## 2022-03-08 LAB — GC/CHLAMYDIA PROBE AMP (~~LOC~~) NOT AT ARMC
Chlamydia: NEGATIVE
Comment: NEGATIVE
Comment: NORMAL
Neisseria Gonorrhea: NEGATIVE

## 2022-03-08 LAB — RPR: RPR Ser Ql: NONREACTIVE

## 2022-03-08 SURGERY — Surgical Case
Anesthesia: Spinal | Laterality: Bilateral

## 2022-03-08 MED ORDER — CEFAZOLIN IN SODIUM CHLORIDE 3-0.9 GM/100ML-% IV SOLN
3.0000 g | INTRAVENOUS | Status: AC
  Administered 2022-03-08: 3 g via INTRAVENOUS
  Filled 2022-03-08 (×2): qty 100

## 2022-03-08 MED ORDER — OXYTOCIN-SODIUM CHLORIDE 30-0.9 UT/500ML-% IV SOLN: 2.5000 [IU]/h | INTRAVENOUS | Status: AC

## 2022-03-08 MED ORDER — DIBUCAINE (PERIANAL) 1 % EX OINT: 1.0000 "application " | TOPICAL_OINTMENT | CUTANEOUS | Status: DC | PRN

## 2022-03-08 MED ORDER — DIPHENHYDRAMINE HCL 25 MG PO CAPS
25.0000 mg | ORAL_CAPSULE | Freq: Four times a day (QID) | ORAL | Status: DC | PRN
  Administered 2022-03-08 – 2022-03-09 (×2): 25 mg via ORAL
  Filled 2022-03-08 (×3): qty 1

## 2022-03-08 MED ORDER — FENTANYL CITRATE (PF) 100 MCG/2ML IJ SOLN
INTRAMUSCULAR | Status: AC
  Filled 2022-03-08: qty 2

## 2022-03-08 MED ORDER — POVIDONE-IODINE 10 % EX SWAB
2.0000 "application " | Freq: Once | CUTANEOUS | Status: AC
  Administered 2022-03-08: 2 via TOPICAL

## 2022-03-08 MED ORDER — PHENYLEPHRINE HCL (PRESSORS) 10 MG/ML IV SOLN
INTRAVENOUS | Status: DC | PRN
  Administered 2022-03-08: 80 ug via INTRAVENOUS

## 2022-03-08 MED ORDER — PHENYLEPHRINE HCL-NACL 20-0.9 MG/250ML-% IV SOLN
INTRAVENOUS | Status: DC | PRN
  Administered 2022-03-08: 60 ug/min via INTRAVENOUS

## 2022-03-08 MED ORDER — NALBUPHINE HCL 10 MG/ML IJ SOLN
5.0000 mg | Freq: Four times a day (QID) | INTRAMUSCULAR | Status: DC | PRN
  Administered 2022-03-08 – 2022-03-09 (×3): 5 mg via INTRAVENOUS
  Filled 2022-03-08 (×3): qty 1

## 2022-03-08 MED ORDER — ENOXAPARIN SODIUM 60 MG/0.6ML IJ SOSY
60.0000 mg | PREFILLED_SYRINGE | INTRAMUSCULAR | Status: DC
  Administered 2022-03-09 – 2022-03-11 (×3): 60 mg via SUBCUTANEOUS
  Filled 2022-03-08 (×3): qty 0.6

## 2022-03-08 MED ORDER — MENTHOL 3 MG MT LOZG: 1.0000 | LOZENGE | OROMUCOSAL | Status: DC | PRN

## 2022-03-08 MED ORDER — ONDANSETRON HCL 4 MG/2ML IJ SOLN
INTRAMUSCULAR | Status: DC | PRN
  Administered 2022-03-08: 4 mg via INTRAVENOUS

## 2022-03-08 MED ORDER — MORPHINE SULFATE (PF) 0.5 MG/ML IJ SOLN
INTRAMUSCULAR | Status: DC | PRN
  Administered 2022-03-08: .15 mg via INTRATHECAL

## 2022-03-08 MED ORDER — MEDROXYPROGESTERONE ACETATE 150 MG/ML IM SUSP
150.0000 mg | INTRAMUSCULAR | Status: DC | PRN
Start: 2022-03-08 — End: 2022-03-11

## 2022-03-08 MED ORDER — FENTANYL CITRATE (PF) 100 MCG/2ML IJ SOLN
INTRAMUSCULAR | Status: DC | PRN
Start: 2022-03-08 — End: 2022-03-08
  Administered 2022-03-08: 15 ug via INTRATHECAL

## 2022-03-08 MED ORDER — IBUPROFEN 600 MG PO TABS
600.0000 mg | ORAL_TABLET | Freq: Four times a day (QID) | ORAL | Status: DC
  Administered 2022-03-09 – 2022-03-11 (×8): 600 mg via ORAL
  Filled 2022-03-08 (×8): qty 1

## 2022-03-08 MED ORDER — OXYCODONE HCL 5 MG PO TABS
5.0000 mg | ORAL_TABLET | ORAL | Status: DC | PRN
  Administered 2022-03-10: 5 mg via ORAL
  Filled 2022-03-08: qty 1

## 2022-03-08 MED ORDER — ONDANSETRON HCL 4 MG/2ML IJ SOLN
INTRAMUSCULAR | Status: AC
  Filled 2022-03-08: qty 2

## 2022-03-08 MED ORDER — FENTANYL CITRATE (PF) 100 MCG/2ML IJ SOLN
INTRAMUSCULAR | Status: DC | PRN
  Administered 2022-03-08: 35 ug via INTRAVENOUS

## 2022-03-08 MED ORDER — FENTANYL CITRATE (PF) 100 MCG/2ML IJ SOLN
25.0000 ug | INTRAMUSCULAR | Status: DC | PRN
  Administered 2022-03-08: 50 ug via INTRAVENOUS

## 2022-03-08 MED ORDER — ACETAMINOPHEN 10 MG/ML IV SOLN
INTRAVENOUS | Status: DC | PRN
  Administered 2022-03-08: 1000 mg via INTRAVENOUS

## 2022-03-08 MED ORDER — MEASLES, MUMPS & RUBELLA VAC IJ SOLR: 0.5000 mL | Freq: Once | INTRAMUSCULAR | Status: DC

## 2022-03-08 MED ORDER — KETOROLAC TROMETHAMINE 30 MG/ML IJ SOLN
30.0000 mg | Freq: Four times a day (QID) | INTRAMUSCULAR | Status: AC
  Administered 2022-03-08 – 2022-03-09 (×4): 30 mg via INTRAVENOUS
  Filled 2022-03-08 (×4): qty 1

## 2022-03-08 MED ORDER — TETANUS-DIPHTH-ACELL PERTUSSIS 5-2.5-18.5 LF-MCG/0.5 IM SUSY: 0.5000 mL | PREFILLED_SYRINGE | Freq: Once | INTRAMUSCULAR | Status: DC

## 2022-03-08 MED ORDER — SIMETHICONE 80 MG PO CHEW: 80.0000 mg | CHEWABLE_TABLET | ORAL | Status: DC | PRN

## 2022-03-08 MED ORDER — SIMETHICONE 80 MG PO CHEW
80.0000 mg | CHEWABLE_TABLET | Freq: Three times a day (TID) | ORAL | Status: DC
  Administered 2022-03-09 – 2022-03-11 (×5): 80 mg via ORAL
  Filled 2022-03-08 (×5): qty 1

## 2022-03-08 MED ORDER — LACTATED RINGERS IV SOLN: INTRAVENOUS | Status: DC

## 2022-03-08 MED ORDER — WITCH HAZEL-GLYCERIN EX PADS: 1.0000 "application " | MEDICATED_PAD | CUTANEOUS | Status: DC | PRN

## 2022-03-08 MED ORDER — BUPIVACAINE IN DEXTROSE 0.75-8.25 % IT SOLN
INTRATHECAL | Status: DC | PRN
  Administered 2022-03-08: 1.6 mL via INTRATHECAL

## 2022-03-08 MED ORDER — PENICILLIN G POT IN DEXTROSE 60000 UNIT/ML IV SOLN: 3.0000 10*6.[IU] | INTRAVENOUS | Status: DC

## 2022-03-08 MED ORDER — KETOROLAC TROMETHAMINE 30 MG/ML IJ SOLN: 30.0000 mg | Freq: Once | INTRAMUSCULAR | Status: DC | PRN

## 2022-03-08 MED ORDER — SODIUM CHLORIDE 0.9 % IR SOLN
Status: DC | PRN
  Administered 2022-03-08: 1

## 2022-03-08 MED ORDER — SOD CITRATE-CITRIC ACID 500-334 MG/5ML PO SOLN
30.0000 mL | ORAL | Status: AC
  Administered 2022-03-08: 30 mL via ORAL
  Filled 2022-03-08: qty 30

## 2022-03-08 MED ORDER — ACETAMINOPHEN 325 MG PO TABS
650.0000 mg | ORAL_TABLET | ORAL | Status: DC | PRN
  Administered 2022-03-10 – 2022-03-11 (×5): 650 mg via ORAL
  Filled 2022-03-08 (×5): qty 2

## 2022-03-08 MED ORDER — MORPHINE SULFATE (PF) 0.5 MG/ML IJ SOLN
INTRAMUSCULAR | Status: AC
  Filled 2022-03-08: qty 10

## 2022-03-08 MED ORDER — SODIUM CHLORIDE 0.9 % IV SOLN: 5.0000 10*6.[IU] | Freq: Once | INTRAVENOUS | Status: DC

## 2022-03-08 MED ORDER — SENNOSIDES-DOCUSATE SODIUM 8.6-50 MG PO TABS
2.0000 | ORAL_TABLET | Freq: Every day | ORAL | Status: DC
  Administered 2022-03-09 – 2022-03-11 (×3): 2 via ORAL
  Filled 2022-03-08 (×4): qty 2

## 2022-03-08 MED ORDER — PRENATAL MULTIVITAMIN CH
1.0000 | ORAL_TABLET | Freq: Every day | ORAL | Status: DC
  Administered 2022-03-09 – 2022-03-11 (×3): 1 via ORAL
  Filled 2022-03-08 (×3): qty 1

## 2022-03-08 MED ORDER — ACETAMINOPHEN 500 MG PO TABS: 1000.0000 mg | ORAL_TABLET | ORAL | Status: DC

## 2022-03-08 MED ORDER — DEXAMETHASONE SODIUM PHOSPHATE 4 MG/ML IJ SOLN
INTRAMUSCULAR | Status: DC | PRN
  Administered 2022-03-08: 5 mg via INTRAVENOUS

## 2022-03-08 MED ORDER — OXYTOCIN-SODIUM CHLORIDE 30-0.9 UT/500ML-% IV SOLN
INTRAVENOUS | Status: DC | PRN
  Administered 2022-03-08 (×2): 150 mL via INTRAVENOUS

## 2022-03-08 MED ORDER — ONDANSETRON HCL 4 MG/2ML IJ SOLN
4.0000 mg | Freq: Three times a day (TID) | INTRAMUSCULAR | Status: DC | PRN
  Administered 2022-03-08: 4 mg via INTRAVENOUS
  Filled 2022-03-08: qty 2

## 2022-03-08 MED ORDER — COCONUT OIL OIL: 1.0000 "application " | TOPICAL_OIL | Status: DC | PRN

## 2022-03-08 MED ORDER — DEXAMETHASONE SODIUM PHOSPHATE 4 MG/ML IJ SOLN
INTRAMUSCULAR | Status: AC
  Filled 2022-03-08: qty 1

## 2022-03-08 MED ORDER — STERILE WATER FOR IRRIGATION IR SOLN
Status: DC | PRN
  Administered 2022-03-08: 1

## 2022-03-08 SURGICAL SUPPLY — 39 items
APL PRP STRL LF DISP 70% ISPRP (MISCELLANEOUS) ×2
APL SKNCLS STERI-STRIP NONHPOA (GAUZE/BANDAGES/DRESSINGS) ×1
BENZOIN TINCTURE PRP APPL 2/3 (GAUZE/BANDAGES/DRESSINGS) ×1 IMPLANT
CHLORAPREP W/TINT 26 (MISCELLANEOUS) ×4 IMPLANT
CLAMP UMBILICAL CORD (MISCELLANEOUS) ×2 IMPLANT
CLOTH BEACON ORANGE TIMEOUT ST (SAFETY) ×2 IMPLANT
DRSG OPSITE POSTOP 4X10 (GAUZE/BANDAGES/DRESSINGS) ×2 IMPLANT
ELECT REM PT RETURN 9FT ADLT (ELECTROSURGICAL) ×2
ELECTRODE REM PT RTRN 9FT ADLT (ELECTROSURGICAL) ×1 IMPLANT
EXTRACTOR VACUUM KIWI (MISCELLANEOUS) IMPLANT
GLOVE BIOGEL PI IND STRL 7.0 (GLOVE) ×1 IMPLANT
GLOVE BIOGEL PI INDICATOR 7.0 (GLOVE) ×1
GLOVE SURG ORTHO 8.0 STRL STRW (GLOVE) ×2 IMPLANT
GOWN STRL REUS W/TWL LRG LVL3 (GOWN DISPOSABLE) ×4 IMPLANT
HEMOSTAT ARISTA ABSORB 3G PWDR (HEMOSTASIS) ×1 IMPLANT
KIT ABG SYR 3ML LUER SLIP (SYRINGE) IMPLANT
NDL HYPO 25X5/8 SAFETYGLIDE (NEEDLE) IMPLANT
NEEDLE HYPO 25X5/8 SAFETYGLIDE (NEEDLE) IMPLANT
NS IRRIG 1000ML POUR BTL (IV SOLUTION) ×2 IMPLANT
PACK C SECTION WH (CUSTOM PROCEDURE TRAY) ×2 IMPLANT
PAD OB MATERNITY 4.3X12.25 (PERSONAL CARE ITEMS) ×2 IMPLANT
RTRCTR C-SECT PINK 25CM LRG (MISCELLANEOUS) IMPLANT
STRIP CLOSURE SKIN 1/2X4 (GAUZE/BANDAGES/DRESSINGS) IMPLANT
STRIP SURGICAL 1/2 X 6 IN (GAUZE/BANDAGES/DRESSINGS) ×1 IMPLANT
SUT MON AB-0 CT1 36 (SUTURE) ×4 IMPLANT
SUT PLAIN 0 NONE (SUTURE) IMPLANT
SUT PLAIN 2 0 (SUTURE) ×4
SUT PLAIN ABS 2-0 CT1 27XMFL (SUTURE) IMPLANT
SUT VIC AB 0 CT1 27 (SUTURE) ×4
SUT VIC AB 0 CT1 27XBRD ANBCTR (SUTURE) ×2 IMPLANT
SUT VIC AB 2-0 CT1 27 (SUTURE) ×2
SUT VIC AB 2-0 CT1 TAPERPNT 27 (SUTURE) ×1 IMPLANT
SUT VIC AB 3-0 SH 27 (SUTURE) ×2
SUT VIC AB 3-0 SH 27X BRD (SUTURE) IMPLANT
SUT VIC AB 4-0 SH 27 (SUTURE) ×2
SUT VIC AB 4-0 SH 27XANBCTRL (SUTURE) ×1 IMPLANT
TOWEL OR 17X24 6PK STRL BLUE (TOWEL DISPOSABLE) ×2 IMPLANT
TRAY FOLEY W/BAG SLVR 14FR LF (SET/KITS/TRAYS/PACK) ×2 IMPLANT
WATER STERILE IRR 1000ML POUR (IV SOLUTION) ×2 IMPLANT

## 2022-03-08 NOTE — Discharge Summary (Signed)
Postpartum Discharge Summary ?   ?Patient Name: Paula [name] ?DOB: 11-15-1991 ?MRN: [id number] ? ?Date of admission: 03/07/2022 ?Delivery date:03/08/2022  ?Delivering provider: Janyth Pupa  ?Date of discharge: 03/11/2022 ? ?Admitting diagnosis: Preterm premature rupture of membranes (PPROM) with onset of labor within 24 hours of rupture in third trimester, antepartum [O42.013] ?Intrauterine pregnancy: [redacted]w[redacted]d    ?Secondary diagnosis:  Principal Problem: ?  Status post repeat low transverse cesarean section ?Active Problems: ?  History of cesarean delivery ?  Supervision of high risk pregnancy, antepartum ?  Carrier of spinal muscular atrophy ?  Breech presentation ?  Gestational hypertension ?  Preterm premature rupture of membranes (PPROM) with onset of labor within 24 hours of rupture in third trimester, antepartum ?  Status post bilateral salpingectomy ? ?Additional problems: Acute blood loss anemia     ?Discharge diagnosis: Preterm Pregnancy Delivered                                              ?Post partum procedures: Bilateral salpingectomy  ?Augmentation: N/A ?Complications: None ? ?Hospital course: Sceduled C/S   31y.o. yo [id number] admitted to the hospital 03/07/2022 for scheduled cesarean section with the following indication:  PPROM, breech presentation, history of cesarean section.  Delivery details are as follows:  ?Membrane Rupture Time/Date: 12:40 PM ,03/07/2022   ?Delivery Method:C-Section, Low Transverse  ?Patient arrived and found to have PPROM. Details of operation can be found in separate operative note.  Patient had an uncomplicated postpartum course.  Her hemoglobin on POD#1 was 9.3, for which she received PO iron.  Her blood pressures remained normotensive. She was enrolled in baby scripts, not on any meds or lasix. She is ambulating, tolerating a regular diet, passing flatus, and urinating well. Patient is discharged home in stable condition on  03/11/22 ?       ?Newborn  Data: ?Birth date:03/08/2022  ?Birth time:2:27 PM  ?Gender:Female  ?Living status:Living  ?Apgars:9 ,9  ?Weight:2490 g    ? ?Magnesium Sulfate received: No ?BMZ received: No ?Rhophylac: N/A ?MMR: N/A ?T-DaP: Given prenatally ?Flu: No ?Transfusion: No ? ?Physical exam  ?Vitals:  ? 03/10/22 0602 03/10/22 1800 03/10/22 2024 03/11/22 0549  ?BP: 124/71 (!) 144/89 129/68 125/73  ?Pulse: 66 80 81 70  ?Resp: _0 ?Temp: 98.6 ?F (37 ?C) 98 ?F (36.7 ?C) 97.9 ?F (36.6 ?C) 97.8 ?F (36.6 ?C)  ?TempSrc: Oral Oral Oral Oral  ?SpO2: 98% 99% 99% 97%  ?Weight:      ? ?General: alert, cooperative, and no distress ?Lochia: appropriate ?Uterine Fundus: firm ?Incision: Healing well with no significant drainage ?DVT Evaluation: No evidence of DVT seen on physical exam. ?No significant calf/ankle edema. ? ?Labs: ?Lab Results  ?Component Value Date  ? WBC 13.2 (H) 03/09/2022  ? HGB 9.3 (L) 03/09/2022  ? HCT 27.4 (L) 03/09/2022  ? MCV 93.2 03/09/2022  ? PLT 197 03/09/2022  ? ? ?  Latest Ref Rng & Units 03/09/2022  ?  5:34 AM  ?CMP  ?Creatinine 0.44 - 1.00 mg/dL 0.74    ? ?Edinburgh Score: ? ?  03/10/2022  ?  8:24 PM  ?EdFlavia Shipperostnatal Depression Scale Screening Tool  ?I have been able to laugh and see the funny side of things. 0  ?I have looked forward with enjoyment to things. 0  ?I  have blamed myself unnecessarily when things went wrong. 0  ?I have been anxious or worried for no good reason. 0  ?I have felt scared or panicky for no good reason. 0  ?Things have been getting on top of me. 0  ?I have been so unhappy that I have had difficulty sleeping. 0  ?I have felt sad or miserable. 0  ?I have been so unhappy that I have been crying. 0  ?The thought of harming myself has occurred to me. 0  ?Edinburgh Postnatal Depression Scale Total 0  ? ? ? ?After visit meds:  ?Allergies as of 03/11/2022   ?No Known Allergies ?  ? ?  ?Medication List  ?  ? ?STOP taking these medications   ? ?aspirin EC 81 MG tablet ?  ?famotidine 20 MG  tablet ?Commonly known as: Pepcid ?  ? ?  ? ?TAKE these medications   ? ?acetaminophen 325 MG tablet ?Commonly known as: TYLENOL ?Take 2 tablets (650 mg total) by mouth every 4 (four) hours as needed for mild pain (temperature > 101.5.). ?What changed:  ?when to take this ?reasons to take this ?  ?ferrous sulfate 325 (65 FE) MG tablet ?Take 1 tablet (325 mg total) by mouth every other day. ?Start taking on: March 13, 2022 ?What changed:  ?medication strength ?when to take this ?  ?ibuprofen 600 MG tablet ?Commonly known as: ADVIL ?Take 1 tablet (600 mg total) by mouth every 6 (six) hours. ?  ?oxyCODONE 5 MG immediate release tablet ?Commonly known as: Oxy IR/ROXICODONE ?Take 1 tablet (5 mg total) by mouth every 4 (four) hours as needed for moderate pain. ?What changed:  ?when to take this ?reasons to take this ?  ?polyethylene glycol powder 17 GM/SCOOP powder ?Commonly known as: GLYCOLAX/MIRALAX ?Take 17 g by mouth daily as needed. ?  ?PRENATAL VITAMIN PO ?Take 1 tablet by mouth daily. ?  ? ?  ? ?  ?  ? ? ?  ?Discharge Care Instructions  ?(From admission, onward)  ?  ? ? ?  ? ?  Start     Ordered  ? 03/11/22 0000  Discharge wound care:       ?Comments: Leave honeycomb dressing on for 5 days, you can shower during this time. ?To remove the honeycomb grab the edges and pull apart like you are stretching it (not up as if it were a bandaid) ?After removal you can allow warm soapy water to run over the wound but do not scrub it until fully healed  ? 03/11/22 1012  ? ?  ?  ? ?  ? ? ? ?Discharge home in stable condition ?Infant Feeding: Breast ?Infant Disposition: home with mother ?Discharge instruction: per After Visit Summary and Postpartum booklet. ?Activity: Advance as tolerated. Pelvic rest for 6 weeks.  ?Diet: routine diet ?Future Appointments: ?Future Appointments  ?Date Time Provider [place]  ?03/13/2022 10:15 AM [facility]ST [facility]  ?03/15/2022  1:20 PM Johney Frame, Greer Ee, MD [facility] None  ? ?Follow  up Visit: ?Message sent to [facility] by Dr. Gwenlyn Perking on 03/08/22.  ? ?Please schedule this patient for a In person postpartum visit in 6 weeks with the following provider: Any provider. ?Additional Postpartum F/U:  Incision check 1 week and BP check 1 week  ?High risk pregnancy complicated by:  Hx of CS, PPROM, breech presentation  ?Delivery mode:  C-Section, Low Transverse  ?Anticipated Birth Control:  Bilateral salpingectomy done ? ?03/11/2022 ?Clarnce Flock, MD ? ? ? ?

## 2022-03-08 NOTE — Anesthesia Procedure Notes (Signed)
Spinal ° °Patient location during procedure: OR °Reason for block: surgical anesthesia °Staffing °Performed: anesthesiologist  °Anesthesiologist: Verlinda Slotnick E, MD °Preanesthetic Checklist °Completed: patient identified, IV checked, risks and benefits discussed, surgical consent, monitors and equipment checked, pre-op evaluation and timeout performed °Spinal Block °Patient position: sitting °Prep: DuraPrep and site prepped and draped °Patient monitoring: continuous pulse ox, blood pressure and heart rate °Approach: midline °Location: L3-4 °Injection technique: single-shot °Needle °Needle type: Pencan  °Needle gauge: 24 G °Needle length: 10 cm °Assessment °Events: CSF return °Additional Notes °Functioning IV was confirmed and monitors were applied. Sterile prep and drape, including hand hygiene and sterile gloves were used. The patient was positioned and the spine was prepped. The skin was anesthetized with lidocaine.  Free flow of clear CSF was obtained prior to injecting local anesthetic into the CSF. The needle was carefully withdrawn. The patient tolerated the procedure well.  ° ° ° °

## 2022-03-08 NOTE — Transfer of Care (Signed)
Immediate Anesthesia Transfer of Care Note ? ?Patient: Paula [name] ? ?Procedure(s) Performed: CESAREAN SECTION WITH BILATERAL TUBAL LIGATION (Bilateral) ? ?Patient Location: PACU ? ?Anesthesia Type:Regional ? ?Level of Consciousness: awake, alert  and oriented ? ?Airway & Oxygen Therapy: Patient Spontanous Breathing ? ?Post-op Assessment: Report given to RN and Post -op Vital signs reviewed and stable ? ?Post vital signs: Reviewed and stable ? ?Last Vitals:  ?Vitals Value Taken Time  ?BP 123/84 03/08/22 1528  ?Temp    ?Pulse 86 03/08/22 1530  ?Resp 12 03/08/22 1530  ?SpO2 100 % 03/08/22 1530  ?Vitals shown include unvalidated device data. ? ?Last Pain:  ?Vitals:  ? 03/08/22 1051  ?TempSrc: Oral  ?PainSc:   ?   ? ?  ? ?Complications: No notable events documented. ?

## 2022-03-08 NOTE — Lactation Note (Signed)
This note was copied from a baby's chart. ?Lactation Consultation Note ? ?Patient Name: Paula [name] ?Today's Date: 03/08/2022 ?Reason for consult: Initial assessment;Late-preterm 34-36.6wks;Infant < 6lbs (GHTN) ?Age:33 hours ?Per mom, infant is not latching at the breast, LC explained this is not uncommon for LPTI.  ?Mom attempted latch infant, infant mostly licked and taste, was holding nipple in mouth. ?LC discussed if infant doesn't latch after 3 to 5 minutes stop and supplement infant to reserve infant's energy.  ?Infant was given 5 mls of Similac Neosure 2 kcal with white Nfant nipple, infant has uncoordinated suck and milk was leaking out the side of infant's mouth, LC offer chin support  while feeding infant, infant may need SLP consult. ?Infant was spitty with a small amount of clear mucous, LC suction infant with bulb syringe. ?Mom was set up with DEBP, fitted with 24 breast flange, mom was still pumping when LC left the room, mom knows to pump every 3 hours for 15 minutes on initial setting. ?Mom shown how to use DEBP & how to disassemble, clean, & reassemble parts.  ?Mom knows breast milk is safe at room temperature for 4 hours whereas formula must be used with 1 hour. ?Mom made aware of O/P services, breastfeeding support groups, community resources, and our phone # for post-discharge questions.   ? Mom's plan: ?1- Mom will follow LPTI feeding policy green sheet given. ?2- Mom will limit total feedings to 30 minutes or less, feed infant every 3 hours, mom understands infant may or may not cue, so don't make infant wait to feeding if going toward 4 hours. ?3- Mom will continue to make attempts to latch infant at the breast but  if infant doesn't latch after 3 to 5 minutes, stop to reserve infant's energy and supplement infant with any EBM first and then formula.  ?4- Mom will continue to pump every 3 hours for 15 minutes on initial setting and giving infant back any of her EBM.  ?Maternal Data ?Does  the patient have breastfeeding experience prior to this delivery?: Yes ?How long did the patient breastfeed?: Per mom, she breiefly BF 1st child for 2 weeks due low milk supply and meds she was taking. ? ?Feeding ?Mother's Current Feeding Choice: Breast Milk and Formula ? ?LATCH Score ?Latch: Too sleepy or reluctant, no latch achieved, no sucking elicited. ? ?Audible Swallowing: None ? ?Type of Nipple: Everted at rest and after stimulation ? ?Comfort (Breast/Nipple): Soft / non-tender ? ?Hold (Positioning): Assistance needed to correctly position infant at breast and maintain latch. ? ?LATCH Score: 5 ? ? ?Lactation Tools Discussed/Used ?  ? ?Interventions ?Interventions: Breast feeding basics reviewed;Pace feeding;Education;Breast compression;Skin to skin;Assisted with latch;LPT handout/interventions;LC Services brochure ? ?Discharge ?  ? ?Consult Status ?Consult Status: Follow-up ?Date: 03/09/22 ?Follow-up type: In-patient ? ? ? ?Vicente Serene ?03/08/2022, 10:18 PM ? ? ? ?

## 2022-03-08 NOTE — Anesthesia Preprocedure Evaluation (Signed)
Anesthesia Evaluation  ?Patient identified by MRN, date of birth, ID band ?Patient awake ? ? ? ?Reviewed: ?Allergy & Precautions, NPO status , Patient's Chart, lab work & pertinent test results ? ?Airway ?Mallampati: II ? ?TM Distance: >3 FB ? ? ? ? Dental ?no notable dental hx. ? ?  ?Pulmonary ?neg pulmonary ROS,  ?  ?Pulmonary exam normal ? ? ? ? ? ? ? Cardiovascular ?hypertension, Pt. on medications ? ?Rhythm:Regular Rate:Normal ? ? ?  ?Neuro/Psych ?negative neurological ROS ? negative psych ROS  ? GI/Hepatic ?negative GI ROS, Neg liver ROS,   ?Endo/Other  ?negative endocrine ROS ? Renal/GU ?negative Renal ROS  ?negative genitourinary ?  ?Musculoskeletal ?negative musculoskeletal ROS ?(+)  ? Abdominal ?Normal abdominal exam  (+)   ?Peds ? Hematology ?negative hematology ROS ?(+)   ?Anesthesia Other Findings ? ? Reproductive/Obstetrics ?(+) Pregnancy ? ?  ? ? ? ? ? ? ? ? ? ? ? ? ? ?  ?  ? ? ? ? ? ? ? ? ?Anesthesia Physical ?Anesthesia Plan ? ?ASA: 2 ? ?Anesthesia Plan: Spinal  ? ?Post-op Pain Management:   ? ?Induction:  ? ?PONV Risk Score and Plan: 2 and Ondansetron, Dexamethasone and Treatment may vary due to age or medical condition ? ?Airway Management Planned: Natural Airway, Simple Face Mask and Nasal Cannula ? ?Additional Equipment: None ? ?Intra-op Plan:  ? ?Post-operative Plan:  ? ?Informed Consent: I have reviewed the patients History and Physical, chart, labs and discussed the procedure including the risks, benefits and alternatives for the proposed anesthesia with the patient or authorized representative who has indicated his/her understanding and acceptance.  ? ? ? ?Dental advisory given ? ?Plan Discussed with: CRNA ? ?Anesthesia Plan Comments: (Lab Results ?     Component                Value               Date                 ?     WBC                      8.8                 03/07/2022           ?     HGB                      10.0 (L)            03/07/2022            ?     HCT                      30.1 (L)            03/07/2022           ?     MCV                      95.0                03/07/2022           ?     PLT                      198  03/07/2022          )  ? ? ? ? ? ? ?Anesthesia Quick Evaluation ? ?

## 2022-03-08 NOTE — Anesthesia Procedure Notes (Deleted)
Spinal ? ?Patient location during procedure: OR ?Start time: 03/08/2022 1:40 PM ?End time: 03/08/2022 1:51 PM ?Staffing ?Performed: anesthesiologist  ?Anesthesiologist: Atilano Median, DO ?Preanesthetic Checklist ?Completed: patient identified, IV checked, site marked, risks and benefits discussed, surgical consent, monitors and equipment checked, pre-op evaluation and timeout performed ?Spinal Block ?Patient position: sitting ?Prep: DuraPrep ?Patient monitoring: heart rate, cardiac monitor, continuous pulse ox and blood pressure ?Approach: midline ?Location: L3-4 ?Injection technique: single-shot ?Needle ?Needle type: Pencan  ?Needle gauge: 24 G ?Needle length: 10 cm ?Assessment ?Events: CSF return and second provider ?Additional Notes ?- Unsuccessful placement by self despite multiple levels and needle, techniques utilized. Dr. Clemens Catholic, MD, successful atraumatic placement.  ? ?Patient identified. Risks/Benefits/Options discussed with patient including but not limited to bleeding, infection, nerve damage, paralysis, failed block, incomplete pain control, headache, blood pressure changes, nausea, vomiting, reactions to medications, itching and postpartum back pain. Confirmed with bedside nurse the patient's most recent platelet count. Confirmed with patient that they are not currently taking any anticoagulation, have any bleeding history or any family history of bleeding disorders. Patient expressed understanding and wished to proceed. All questions were answered. Sterile technique was used throughout the entire procedure. Please see nursing notes for vital signs. Warning signs of high block given to the patient including shortness of breath, tingling/numbness in hands, complete motor block, or any concerning symptoms with instructions to call for help. Patient was given instructions on fall risk and not to get out of bed. All questions and concerns addressed with instructions to call with any issues or  inadequate analgesia.   ?  ? ? ? ?

## 2022-03-08 NOTE — Lactation Note (Signed)
This note was copied from a baby's chart. ?Lactation Consultation Note ? ?Patient Name: Paula [name] ?Today's Date: 03/08/2022 ?  ?Age:33 hours ?P2, LPTI female infant. ?Mokm was eating dinner when LC entered the room, mom will call [facility] services when she is ready to be seen, [facility] written name on white board in room.  ?Maternal Data ?  ? ?Feeding ?Nipple Type: Nfant Standard Flow (white) ? ?LATCH Score ?  ? ?  ? ?  ? ?  ? ?  ? ?  ? ? ?Lactation Tools Discussed/Used ?  ? ?Interventions ?  ? ?Discharge ?  ? ?Consult Status ?  ? ? ? ?Danelle Earthly ?03/08/2022, 7:01 PM ? ? ? ?

## 2022-03-08 NOTE — Progress Notes (Signed)
Risk benefits and alternatives of cesarean section were discussed with the patient including but not limited to infection, bleeding, damage to bowel , bladder and baby with the need for further surgery. Pt voiced understanding and desires to proceed.   Bilateral tubal ligation reviewed via salpingectomy with R&B including but not limited to bleeding, infection, injury to other organs, irreversibility and failure rate of 11/998. Questions answered. ?  ?Myna Hidalgo, DO ?Attending Obstetrician & Gynecologist, Faculty Practice ?Center for [employer], [facility] ? ? ?

## 2022-03-08 NOTE — Op Note (Addendum)
[name] ? ?PROCEDURE DATE: 03/08/2022 ? ?PREOPERATIVE DIAGNOSES: Intrauterine pregnancy at [redacted]w[redacted]d weeks gestation; history of cesarean section; PPROM; breech presentation; unwanted fertility  ? ?POSTOPERATIVE DIAGNOSES: The same ? ?PROCEDURE: Repeat Low Transverse Cesarean Section and Bilateral Salpingectomy  ? ?SURGEON:  Dr. Myna Hidalgo ? ?ASSISTANT:  Dr. Evalina Field ? ?ANESTHESIOLOGY TEAM: Anesthesiologist: Atilano Median, DO ?CRNA: Cleda Clarks, CRNA; Aundria Rud, CRNA ? ?INDICATIONS: Paula [name] is a 31 y.o. [phone] at [redacted]w[redacted]d here for cesarean section secondary to the indications listed under preoperative diagnoses; please see preoperative note for further details.  The risks of cesarean section were discussed with the patient including but were not limited to: bleeding which may require transfusion or reoperation; infection which may require antibiotics; injury to bowel, bladder, ureters or other surrounding organs; injury to the fetus; need for additional procedures including hysterectomy in the event of a life-threatening hemorrhage; placental abnormalities wth subsequent pregnancies, incisional problems, thromboembolic phenomenon and other postoperative/anesthesia complications.   The patient concurred with the proposed plan, giving informed written consent for the procedure.   ? ?FINDINGS:  Viable female infant in complete breech presentation.  Apgars 9 and 9.  Clear amniotic fluid.  Intact placenta, three vessel cord.  Normal uterus, fallopian tubes and ovaries bilaterally. Moderate adhesive disease between the fascia and rectus muscles. The bladder was adhered to the anterior aspect of the uterus. Small tear noted in the vesicouterine reflection overlying the bladder that was over sown and found to be hemostatic.  ? ?ANESTHESIA: Spinal  ?INTRAVENOUS FLUIDS: 600 ml   ?ESTIMATED BLOOD LOSS: 394 ml ?URINE OUTPUT:  225 ml ?SPECIMENS: Placenta sent to L&D   ?COMPLICATIONS: None immediate ? ?PROCEDURE IN DETAIL:   ?The patient preoperatively received intravenous antibiotics and had sequential compression devices applied to her lower extremities.  She was then taken to the operating room where spinal anesthesia was administered and was found to be adequate. She was then placed in a dorsal supine position with a leftward tilt, and prepped and draped in a sterile manner.  A foley catheter was placed into her bladder and attached to constant gravity.   ? ?After an adequate timeout was performed, a Pfannenstiel skin incision was made with scalpel on her preexisting scar and carried through to the underlying layer of fascia. The fascia was incised in the midline, and this incision was extended bilaterally using the Mayo scissors.  Kocher clamps were applied to the inferior aspect of the fascial incision and the underlying rectus muscles were dissected off bluntly and sharply.  A similar process was carried out on the superior aspect of the fascial incision. The rectus muscles were separated in the midline both bluntly and sharply and the peritoneum was entered bluntly. The Alexis self-retaining retractor was introduced into the abdominal cavity.  ? ?The vesicouterine reflection was noted to be adhered to the anterior aspect of the uterus. This was released bluntly.  ? ?Attention was turned to the lower uterine segment where a low transverse hysterotomy was made with a scalpel and extended bilaterally bluntly.  The feet were located and delivered through the hysterotomy. With fundal pressure, the legs then delivered and the baby was rotated so that the back was anterior. The shoulders were then released and the body was lifted superiorly to allow for flexion of the head and delivery. The infant was successfully delivered, the cord was clamped and cut after one minute, and the infant was handed over to the awaiting neonatology team.  Uterine massage was then administered, and  the placenta delivered intact with a three-vessel cord. The uterus was then cleared of clots and debris.   ? ?The hysterotomy was closed with 0 Vicryl in a running locked fashion.  The uterus was then exteriorized.  ? ?Attention was then turned to the right fallopian tube. The ampulla of the fallopian tube was grasped and a window was created with electrocautery in the mesosalpinx.  This potion of the tube was double suture ligated.  Kelly forceps were then placed on the mesosalpinx underneath most of the tube.  This pedicle was double suture ligated with a free tie and 2-0 Vicryl, and the tube including the fimbriated end was excised.  The left fallopian tube was then identified, and was excised in a similar fashion allowing for bilateral tubal sterilization via bilateral salpingectomy.  Good hemostasis was noted overall.  The uterus was returned to the abdomen. The pelvis was cleared of all clot and debris. Hemostasis was confirmed on all surfaces.  The retractor was removed.   ? ?Attention was then turned to the vesicouterine reflection of the bladder. The foley was palpated and brought to the area of concern. No defect in the bladder was noted, foley balloon was well visualized without compromise.  No leakage or tear noted. A small, 1 cm defect was noted with slight bleeding present at the reflection. This was repaired with 3-0 Vicryl and noted to be hemostatic.  Arista was applied.  ? ?The fascia was then closed using 0 Vicryl in a running fashion.  The subcutaneous layer was irrigated, re-approximated with a 2-0 plain gut running stitch, and the skin was closed with a 4-0 Vicryl subcuticular stitch. The patient tolerated the procedure well. Sponge, instrument and needle counts were correct x 3.  She was taken to the recovery room in stable condition.  ? ?Evalina Field, MD  ?[facility] Fellow  Faculty Practice  ? ? ?

## 2022-03-09 ENCOUNTER — Encounter (HOSPITAL_COMMUNITY): Payer: Self-pay | Admitting: Obstetrics & Gynecology

## 2022-03-09 LAB — CBC
HCT: 27.4 % — ABNORMAL LOW (ref 36.0–46.0)
Hemoglobin: 9.3 g/dL — ABNORMAL LOW (ref 12.0–15.0)
MCH: 31.6 pg (ref 26.0–34.0)
MCHC: 33.9 g/dL (ref 30.0–36.0)
MCV: 93.2 fL (ref 80.0–100.0)
Platelets: 197 10*3/uL (ref 150–400)
RBC: 2.94 MIL/uL — ABNORMAL LOW (ref 3.87–5.11)
RDW: 13.3 % (ref 11.5–15.5)
WBC: 13.2 10*3/uL — ABNORMAL HIGH (ref 4.0–10.5)
nRBC: 0 % (ref 0.0–0.2)

## 2022-03-09 LAB — CREATININE, SERUM
Creatinine, Ser: 0.74 mg/dL (ref 0.44–1.00)
GFR, Estimated: >60 mL/min (ref >60)

## 2022-03-09 MED ORDER — LACTATED RINGERS IV BOLUS
500.0000 mL | Freq: Once | INTRAVENOUS | Status: AC
  Administered 2022-03-09: 500 mL via INTRAVENOUS

## 2022-03-09 MED ORDER — HYDROXYZINE HCL 10 MG PO TABS: 10.0000 mg | ORAL_TABLET | Freq: Three times a day (TID) | ORAL | Status: DC | PRN

## 2022-03-09 MED ORDER — FERROUS SULFATE 325 (65 FE) MG PO TABS
325.0000 mg | ORAL_TABLET | ORAL | Status: DC
Start: 2022-03-09 — End: 2022-03-11
  Administered 2022-03-09 – 2022-03-11 (×2): 325 mg via ORAL
  Filled 2022-03-09 (×2): qty 1

## 2022-03-09 NOTE — Anesthesia Postprocedure Evaluation (Signed)
Anesthesia Post Note ? ?Patient: Paytience Bures ? ?Procedure(s) Performed: CESAREAN SECTION WITH BILATERAL TUBAL LIGATION (Bilateral) ? ?  ? ?Patient location during evaluation: PACU ?Anesthesia Type: Spinal ?Level of consciousness: awake and alert ?Pain management: pain level controlled ?Vital Signs Assessment: post-procedure vital signs reviewed and stable ?Respiratory status: spontaneous breathing, nonlabored ventilation and respiratory function stable ?Cardiovascular status: stable and blood pressure returned to baseline ?Postop Assessment: no apparent nausea or vomiting ?Anesthetic complications: no ? ? ?No notable events documented. ? ?Last Vitals:  ?Vitals:  ? 03/09/22 0012 03/09/22 0415  ?BP: 126/71 110/71  ?Pulse: 72 72  ?Resp: 20 20  ?Temp: 36.6 ?C 36.9 ?C  ?SpO2: 98% 98%  ?  ?Last Pain:  ?Vitals:  ? 03/09/22 0415  ?TempSrc: Oral  ?PainSc: 0-No pain  ? ? ?  ?  ?  ?  ?  ?  ? ?Nelle Don Queen Abbett ? ? ? ? ?

## 2022-03-09 NOTE — Lactation Note (Signed)
This note was copied from a baby's chart. ?Lactation Consultation Note ? ?Patient Name: Paula [name] ?Today's Date: 03/09/2022 ?Reason for consult: Follow-up assessment;Infant < 6lbs;Late-preterm 33-36.6wks ?Age:31 years ? ?LC in to room for follow up. Mother reports recent formula feeding, 23 mL. Reviewed LPTI policy and volume guidelines. Demonstrated pacing, upright position and burping.   Encouraged to continue using DEBP after each feeding.  ?Reinforced calling for support.  ? ?Plan: ?1-Feed (breast and/or formula) no longer than 30 minutes to preserve energy ?2-Pump using initiation setting or hand express to supplement ?3-Spoon or fingerfeed EBM  ?4-Pacing and side-lying position when bottle-feeding formula. ?5-Contact LC for support  ? ?All questions answered at this time. Mother is able to teach back feeding plan.   ? ?Feeding ?Mother's Current Feeding Choice: Breast Milk and Formula ?Nipple Type: Nfant Standard Flow (white) ? ?Interventions ?Interventions: Breast feeding basics reviewed;Skin to skin;Expressed milk;Education;Pace feeding;LPT handout/interventions ? ?Consult Status ?Consult Status: Follow-up ?Date: 03/10/22 ?Follow-up type: In-patient ? ? ? ?Danitza Schoenfeldt A Higuera Ancidey ?03/09/2022, 2:58 PM ? ? ? ?

## 2022-03-09 NOTE — Progress Notes (Signed)
POSTPARTUM PROGRESS NOTE ? ?POD #1 ? ?Subjective: ? ?Semaya Shayle Donahoo is a 31 y.o. [id number] s/p rLTCS +BTL at [redacted]w[redacted]d. No acute events overnight. She reports she is doing well. She denies any problems with ambulating, voiding or po intake. Denies nausea or vomiting. She has passed flatus. Pain is well controlled.  Lochia is mild. Having some itching.  ? ?Objective: ?Blood pressure 110/71, pulse 72, temperature 98.4 ?F (36.9 ?C), temperature source Oral, resp. rate 20, weight 122.5 kg, last menstrual period 06/08/2021, SpO2 98 %, unknown if currently breastfeeding. ? ?Physical Exam:  ?General: alert, cooperative and no distress ?Chest: no respiratory distress ?Heart: regular rate, distal pulses intact ?Uterine Fundus: firm, appropriately tender ?DVT Evaluation: No calf swelling or tenderness ?Extremities: minimal edema ?Skin: warm, dry; incision clean/dry/intact w/ honeycomb dressing in place ? ?Recent Labs  ?  03/07/22 ?1817 03/09/22 ?6283  ?HGB 10.0* 9.3*  ?HCT 30.1* 27.4*  ? ? ?Assessment/Plan: ?Earla Charlie is a 31 y.o. [phone] s/p rLTCS +BTL at [redacted]w[redacted]d for PPROM. ? ?POD#1 - Doing welll; pain well controlled.  ? Routine postpartum care ? OOB, ambulated ? Lovenox for VTE prophylaxis ?Acute blood loss Anemia: asymptomatic ? Start po ferrous sulfate every other day  ? ?Contraception: BTL ?Feeding: breast ? ?Dispo: Plan for discharge tomorrow or following day pending clinical course. ? ? LOS: 2 days  ? ?Leticia Penna, DO ?OB Fellow  ?03/09/2022, 7:53 AM  ?

## 2022-03-10 NOTE — Progress Notes (Addendum)
POSTPARTUM PROGRESS NOTE ? ?POD #2 ? ?Subjective: ? ?Paula [name] is a 31 y.o. [phone] s/p rLTCS and bilateral salpingectomy at [redacted]w[redacted]d. Today she notes some discomfort with movement, not feeling like pain medication is helping.  Currently taking ibuprofen/tylenol. She denies any problems with ambulating, voiding or po intake. Denies nausea or vomiting. She has passed flatus, no BM.  Lochia appropriate ?Denies fever/chills/chest pain/SOB. ? ?Objective: ?Blood pressure 124/71, pulse 66, temperature 98.6 ?F (37 ?C), temperature source Oral, resp. rate 20, weight 122.5 kg, last menstrual period 06/08/2021, SpO2 98 %, unknown if currently breastfeeding. ? ?Physical Exam:  ?General: alert, cooperative and no distress ?Chest: no respiratory distress ?Heart: regular rate and rhythm ?Abdomen: soft, nontender, +BS ?Uterine Fundus: firm, appropriately tender ?Incision: C/D/I with honeycomb ?DVT Evaluation: No calf swelling or tenderness ?Extremities: minimal edema, SCDs in place ?Skin: warm, dry ? ?Assessment/Plan: ?Paula [name] is a 31 y.o. [phone] s/p rLTCS and bilateral salpingectomy at [redacted]w[redacted]d POD#2  ?-Gestational HTN ? BP within normal range, no medication at this time ?-reviewed pain management- encouraged oxycodone when needed ?-voiding without difficulty ?-encourage ambulation ?-meeting milestones appropriately ?-Lovenox for DVT prophylaxis ? ?Contraception: salpingectomy ?Feeding: bottle ? ?Dispo: Continue with routine postop care as above.   ? LOS: 3 days  ? ?Janyth Pupa, DO ?Faculty Attending, Center for [employer] ?03/10/2022, 7:05 AM  ?

## 2022-03-10 NOTE — Lactation Note (Signed)
This note was copied from a baby's chart. ?Lactation Consultation Note ? ?Patient Name: Paula [name] ?Today's Date: 03/10/2022 ?Reason for consult: Follow-up assessment;Late-preterm 34-36.6wks;Infant < 6lbs ?Age:33 hours ? ?P2 mother whose infant is now 32 hours old.  This is a LPTI at 35+6 weeks with a CGA of 36+1 weeks.  Mother's current feeding preference is breast/formula. ? ?Baby was asleep on mother's chest when I arrived.  When questioned about latching, mother replied that she wanted to wait "until her milk came in" before working on breast feeding.  She has a DEBP set up at bedside but has not been consistent with using her pump.  Offered to return to assist with latching if mother desires and also encouraged baby to be STS and at the breast, even if she just licks  Discussed using the DEBP every three hours after formula feeding to stimulate the breasts and to help ensure a good milk supply.  Reminded parents that baby may take a few weeks to learn how to effectively breast feed due to her gestational age.  Parents verbalized understanding. ? ?Mother has a DEBP for home use. ? ? ?Maternal Data ?Has patient been taught Hand Expression?: Yes ?Does the patient have breastfeeding experience prior to this delivery?: Yes ?How long did the patient breastfeed?: 2 weeks ? ?Feeding ?Mother's Current Feeding Choice: Breast Milk and Formula ?Nipple Type: Nfant Slow Flow (purple) ? ?LATCH Score ?  ? ?  ? ?  ? ?  ? ?  ? ?  ? ? ?Lactation Tools Discussed/Used ?Tools: Pump;Flanges ?Flange Size: 24;27 ?Breast pump type: Double-Electric Breast Pump ?Reason for Pumping: Stimulation for supplementation for LPTI ?Pumping frequency: Every three hours ? ?Interventions ?Interventions: Breast feeding basics reviewed;Education ? ?Discharge ?Pump: DEBP;Manual ? ?Consult Status ?Consult Status: Follow-up ?Date: 03/11/22 ?Follow-up type: In-patient ? ? ? ?Britini Garcilazo R Smita Lesh ?03/10/2022, 11:29 AM ? ? ? ?

## 2022-03-10 NOTE — Progress Notes (Signed)
Patient had a BP of 144/89 which was her first elevated blood pressure since delivery. Given her history of GHTN and new protocol to notify above 135 and 55 RN notified Dr. Ilda Basset, MD of elevated pressure. RN given instruction to repeat BP at 2100. RN will report to night shift the instructions made by Dr. Ilda Basset, MD at 1900 bedside shift report. ?

## 2022-03-11 ENCOUNTER — Encounter: Payer: Self-pay | Admitting: *Deleted

## 2022-03-11 ENCOUNTER — Other Ambulatory Visit (HOSPITAL_COMMUNITY): Payer: Self-pay

## 2022-03-11 LAB — CULTURE, BETA STREP (GROUP B ONLY)

## 2022-03-11 MED ORDER — OXYCODONE HCL 5 MG PO TABS: 5.0000 mg | ORAL_TABLET | ORAL | 0 refills | Status: DC | PRN

## 2022-03-11 MED ORDER — ACETAMINOPHEN 325 MG PO TABS
650.0000 mg | ORAL_TABLET | ORAL | 0 refills | Status: DC | PRN
Start: 2022-03-11 — End: 2022-04-17

## 2022-03-11 MED ORDER — IBUPROFEN 600 MG PO TABS
600.0000 mg | ORAL_TABLET | Freq: Four times a day (QID) | ORAL | 0 refills | Status: DC
  Filled 2022-03-11: qty 30, 8d supply, fill #0

## 2022-03-11 MED ORDER — POLYETHYLENE GLYCOL 3350 17 GM/SCOOP PO POWD: 17.0000 g | Freq: Every day | ORAL | 1 refills | Status: DC | PRN

## 2022-03-11 MED ORDER — FERROUS SULFATE 325 (65 FE) MG PO TABS: 325.0000 mg | ORAL_TABLET | ORAL | 3 refills | Status: DC

## 2022-03-11 MED ORDER — IBUPROFEN 600 MG PO TABS: 600.0000 mg | ORAL_TABLET | Freq: Four times a day (QID) | ORAL | 0 refills | Status: DC

## 2022-03-11 MED ORDER — POLYETHYLENE GLYCOL 3350 17 GM/SCOOP PO POWD
17.0000 g | Freq: Every day | ORAL | 1 refills | Status: DC | PRN
  Filled 2022-03-11: qty 510, 30d supply, fill #0

## 2022-03-11 NOTE — Lactation Note (Addendum)
This note was copied from a baby's chart. ?Lactation Consultation Note ? ?Patient Name: Paula [name] ?Today's Date: 03/11/2022 ?Reason for consult: Follow-up assessment;Infant < 6lbs;Late-preterm 34-36.6wks ?Age:32 hours ? ?P1,  < 6 lbs.  Mother states baby has been licking and learning at breast and has not sustained latch.  Mother is supplementing with formula up to 36 ml.  Encouraged mother to continue pumping once home q 3 hours to establish a full milk supply.  ?Feed on demand with cues.  Goal 8-12+ times per day after first 24 hrs.  Place baby STS if not cueing. Wake baby for feeds if needed.  ?Discussed increasing volume per day of life and as baby desires and frequency, cluster feeding.  ?Reviewed engorgement care and monitoring voids/stools. ? ? ?Feeding ?Mother's Current Feeding Choice: Breast Milk and Formula ?Nipple Type: Nfant Slow Flow (purple) ? ? ?Lactation Tools Discussed/Used ?Tools: Pump ?Flange Size: 24;27 ?Breast pump type: Double-Electric Breast Pump ?Pump Education: Milk Storage ?Reason for Pumping: stimulation and supplementation ?Pumping frequency: q 3 hours ? ?Interventions ?Interventions: DEBP;Education ? ?Discharge ?Discharge Education: Engorgement and breast care;Warning signs for feeding baby ?Pump: DEBP ? ?Consult Status ?Consult Status: Complete ?Date: 03/12/22 ? ? ? ?Dahlia Byes Boschen ?03/11/2022, 10:00 AM ? ? ? ?

## 2022-03-12 LAB — SURGICAL PATHOLOGY

## 2022-03-13 ENCOUNTER — Other Ambulatory Visit: Payer: Medicaid Other

## 2022-03-13 ENCOUNTER — Other Ambulatory Visit: Payer: Self-pay

## 2022-03-13 ENCOUNTER — Encounter: Payer: Self-pay | Admitting: Obstetrics & Gynecology

## 2022-03-13 ENCOUNTER — Encounter (HOSPITAL_COMMUNITY): Payer: Self-pay | Admitting: Obstetrics and Gynecology

## 2022-03-13 ENCOUNTER — Inpatient Hospital Stay (HOSPITAL_COMMUNITY)
Admission: AD | Admit: 2022-03-13 | Discharge: 2022-03-14 | Disposition: A | Discharge status: Home / Self Care | Payer: Medicaid Other | Attending: Obstetrics and Gynecology | Admitting: Obstetrics and Gynecology

## 2022-03-13 DIAGNOSIS — O1205 Gestational edema, complicating the puerperium: Secondary | ICD-10-CM | POA: Diagnosis not present

## 2022-03-13 DIAGNOSIS — Z79899 Other long term (current) drug therapy: Secondary | ICD-10-CM | POA: Diagnosis not present

## 2022-03-13 DIAGNOSIS — O165 Unspecified maternal hypertension, complicating the puerperium: Secondary | ICD-10-CM | POA: Diagnosis present

## 2022-03-13 NOTE — MAU Provider Note (Signed)
Chief Complaint:  Hypertension ? ? Event Date/Time  ? First Provider Initiated Contact with Patient 03/13/22 2353   ?  ? ? ?HPI: Bo MerinoCharese Ozella Rockslizabeth Eifert is a 31 y.o. [phone] who presents to maternity admissions reporting elevated Blood Pressure for the past 2 days.  Also has a slight headache "2".  Also has increased swelling of feet and ankles.   ? ?Was found to have intermittent elevated BP in late pregnancy but was normal prior to discharge so was not sent home on any meds. Marland Kitchen. ?She reports no dizziness, n/v, or fever/chills.   ? ?Hypertension ?This is a recurrent problem. The current episode started in the past 7 days. The problem is unchanged. Associated symptoms include headaches (very mild). Pertinent negatives include no anxiety, blurred vision, chest pain, malaise/fatigue or neck pain. There are no associated agents to hypertension. There are no known risk factors for coronary artery disease. Past treatments include nothing. There are no compliance problems.   ? ?RN Note ?Adana Ozella Rockslizabeth Corsi is a 31 y.o. at postpartum c/s on 4/14 here in MAU reporting: elevated blood pressures on Sunday and again today. Normal blood pressures on Monday and Tuesday per patient. Reports slight HA, but "not enough to take anything for it" states she did take tylenol around 1900 "just to see" but it didn't help "it was a minor headache." States her incision will bother her when she "walks too much, sit, or lay down for long period of time" reports having to take her oxycodone and ibuprofen as needed for incision pain and with that it will lower blood pressure. Pt reports blood pressure readings at home this evening was 151/97 at 2102 and 138/100 at 2232. Called the on call doctor and was told to come in. Reports taking low dose aspirin while she was pregnant, but not taking anything for her blood pressure post delivery. Denies visual disturbances or epigastric pain. Pt reports new swelling in her feet over the past 2 days.   ? ?Past Medical History: ?Past Medical History:  ?Diagnosis Date  ? Chlamydia 2011  ? Hypertension   ? Gestatoinal  ? Medical history non-contributory   ? ? ?Past obstetric history: ?OB History  ?Gravida Para Term Preterm AB Living  ?3 2 1 1 1 2   ?SAB IAB Ectopic Multiple Live Births  ?1     0 2  ?  ?# Outcome Date GA Lbr Len/2nd Weight Sex Delivery Anes PTL Lv  ?3 Preterm 03/08/22 2411w6d  2490 g F CS-LTranv Spinal  LIV  ?2 Term 05/28/16 [id number]  2300 g F CS-LTranv EPI  LIV  ?1 SAB           ?  ?Obstetric Comments  ?"Chemical preg"  ?C/s: "placenta started to detach"  ? ? ?Past Surgical History: ?Past Surgical History:  ?Procedure Laterality Date  ? CESAREAN SECTION N/A 05/28/2016  ? Procedure: CESAREAN SECTION;  Surgeon: Silverio LaySandra Rivard, MD;  [facility];  Service: Obstetrics;  Laterality: N/A;  ? CESAREAN SECTION WITH BILATERAL TUBAL LIGATION Bilateral 03/08/2022  ? Procedure: CESAREAN SECTION WITH BILATERAL TUBAL LIGATION;  Surgeon: Myna Hidalgozan, Jennifer, DO;  Location: [facility] LD ORS;  Service: Obstetrics;  Laterality: Bilateral;  ? WISDOM TOOTH EXTRACTION    ? ? ?Family History: ?Family History  ?Problem Relation Age of Onset  ? Healthy Father   ? Hypertension Brother   ? ? ?Social History: ?Social History  ? ?Tobacco Use  ? Smoking status: Never  ? Smokeless tobacco: Never  ?Vaping  Use  ? Vaping Use: Never used  ?Substance Use Topics  ? Alcohol use: Never  ? Drug use: No  ? ? ?Allergies: No Known Allergies ? ?Meds:  ?Medications Prior to Admission  ?Medication Sig Dispense Refill Last Dose  ? acetaminophen (TYLENOL) 325 MG tablet Take 2 tablets (650 mg total) by mouth every 4 (four) hours as needed for mild pain (temperature > 101.5.). 30 tablet 0 03/13/2022 at 1900  ? ferrous sulfate 325 (65 FE) MG tablet Take 1 tablet (325 mg total) by mouth every other day. 30 tablet 3 03/13/2022  ? ibuprofen (ADVIL) 600 MG tablet Take 1 tablet (600 mg total) by mouth every 6 (six) hours. 30 tablet 0 03/12/2022  ? oxyCODONE (OXY  IR/ROXICODONE) 5 MG immediate release tablet Take 1 tablet (5 mg total) by mouth every 4 (four) hours as needed for moderate pain. 20 tablet 0 03/11/2022  ? Prenatal Vit-Fe Fumarate-FA (PRENATAL VITAMIN PO) Take 1 tablet by mouth daily.   03/13/2022  ? polyethylene glycol powder (GLYCOLAX/MIRALAX) 17 GM/SCOOP powder Take 17 g by mouth daily as needed. 510 g 1   ? ? ?I have reviewed patient's Past Medical Hx, Surgical Hx, Family Hx, Social Hx, medications and allergies. ? ?ROS:  ?Review of Systems  ?Constitutional:  Negative for malaise/fatigue.  ?Eyes:  Negative for blurred vision.  ?Cardiovascular:  Negative for chest pain.  ?Musculoskeletal:  Negative for neck pain.  ?Neurological:  Positive for headaches (very mild).  ?Other systems negative ? ?  ? ?Physical Exam  ?Patient Vitals for the past 24 hrs: ? BP Temp Temp src Pulse Resp SpO2 Height Weight  ?03/13/22 2349 (!) 161/75 -- -- 79 -- -- -- --  ?03/13/22 2327 (!) 164/86 98.2 ?F (36.8 ?C) Oral 76 18 97 % 5\' 4"  (1.626 m) 98.4 kg  ? ?Constitutional: Well-developed, well-nourished female in no acute distress.  ?Cardiovascular: normal rate and rhythm, no ectopy audible, S1 & S2 heard, no murmur ?Respiratory: normal effort, no distress. Lungs CTAB with no wheezes or crackles ?GI: Abd soft, non-tender.  Nondistended.  No rebound, No guarding.  Bowel Sounds audible  ?MS: Extremities nontender, no edema, normal ROM ?Neurologic: Alert and oriented x 4.   Grossly nonfocal. ?GU: Neg CVAT. ?Skin:  Warm and Dry ?Psych:  Affect appropriate. ? ?PELVIC EXAM: Cervix pink, visually closed, without lesion, scant white creamy discharge, vaginal walls and external genitalia normal ?Bimanual exam: Cervix firm, anterior, neg CMT, uterus nontender, nonenlarged, adnexa without tenderness, enlargement, or mass  Exam is limited by body habitus. ? ?  ?Labs: ?Results for orders placed or performed during the hospital encounter of 03/13/22 (from the past 24 hour(s))  ?Urinalysis, Routine w  reflex microscopic Urine, Clean Catch     Status: Abnormal  ? Collection Time: 03/13/22 11:39 PM  ?Result Value Ref Range  ? Color, Urine YELLOW YELLOW  ? APPearance HAZY (A) CLEAR  ? Specific Gravity, Urine 1.031 (H) 1.005 - 1.030  ? pH 5.0 5.0 - 8.0  ? Glucose, UA NEGATIVE NEGATIVE mg/dL  ? Hgb urine dipstick MODERATE (A) NEGATIVE  ? Bilirubin Urine NEGATIVE NEGATIVE  ? Ketones, ur NEGATIVE NEGATIVE mg/dL  ? Protein, ur 30 (A) NEGATIVE mg/dL  ? Nitrite NEGATIVE NEGATIVE  ? Leukocytes,Ua SMALL (A) NEGATIVE  ? RBC / HPF 11-20 0 - 5 RBC/hpf  ? WBC, UA 21-50 0 - 5 WBC/hpf  ? Bacteria, UA NONE SEEN NONE SEEN  ? Squamous Epithelial / LPF 0-5 0 - 5  ?  Mucus PRESENT   ?Protein / creatinine ratio, urine     Status: Abnormal  ? Collection Time: 03/13/22 11:39 PM  ?Result Value Ref Range  ? Creatinine, Urine 312.96 mg/dL  ? Total Protein, Urine 55 mg/dL  ? Protein Creatinine Ratio 0.18 (H) 0.00 - 0.15 mg/mg[Cre]  ?CBC     Status: Abnormal  ? Collection Time: 03/14/22 12:54 AM  ?Result Value Ref Range  ? WBC 6.3 4.0 - 10.5 K/uL  ? RBC 3.03 (L) 3.87 - 5.11 MIL/uL  ? Hemoglobin 9.5 (L) 12.0 - 15.0 g/dL  ? HCT 29.5 (L) 36.0 - 46.0 %  ? MCV 97.4 80.0 - 100.0 fL  ? MCH 31.4 26.0 - 34.0 pg  ? MCHC 32.2 30.0 - 36.0 g/dL  ? RDW 13.4 11.5 - 15.5 %  ? Platelets 260 150 - 400 K/uL  ? nRBC 0.0 0.0 - 0.2 %  ?Comprehensive metabolic panel     Status: Abnormal  ? Collection Time: 03/14/22 12:54 AM  ?Result Value Ref Range  ? Sodium 140 135 - 145 mmol/L  ? Potassium 3.3 (L) 3.5 - 5.1 mmol/L  ? Chloride 109 98 - 111 mmol/L  ? CO2 23 22 - 32 mmol/L  ? Glucose, Bld 97 70 - 99 mg/dL  ? BUN 10 6 - 20 mg/dL  ? Creatinine, Ser 0.74 0.44 - 1.00 mg/dL  ? Calcium 8.8 (L) 8.9 - 10.3 mg/dL  ? Total Protein 5.7 (L) 6.5 - 8.1 g/dL  ? Albumin 2.4 (L) 3.5 - 5.0 g/dL  ? AST 20 15 - 41 U/L  ? ALT 28 0 - 44 U/L  ? Alkaline Phosphatase 92 38 - 126 U/L  ? Total Bilirubin 0.4 0.3 - 1.2 mg/dL  ? GFR, Estimated >60 >60 mL/min  ? Anion gap 8 5 - 15  ? ? ?--/--/O POS  (04/13 1820) ? ?Imaging:  ? ?MAU Course/MDM: ?I have ordered labs as follows:  Preeclampsia labs are normal ?Imaging ordered: none ?Results reviewed.  ? Consult Dr Vergie Living  OK to start on meds and follow

## 2022-03-13 NOTE — MAU Note (Addendum)
Paula [name] is a 31 y.o. at postpartum c/s on 4/14 here in MAU reporting: elevated blood pressures on Sunday and again today. Normal blood pressures on Monday and Tuesday per patient. Reports slight HA, but "not enough to take anything for it" states she did take tylenol around 1900 "just to see" but it didn't help "it was a minor headache." States her incision will bother her when she "walks too much, sit, or lay down for long period of time" reports having to take her oxycodone and ibuprofen as needed for incision pain and with that it will lower blood pressure. Pt reports blood pressure readings at home this evening was 151/97 at 2102 and 138/100 at 2232. Called the on call doctor and was told to come in. Reports taking low dose aspirin while she was pregnant, but not taking anything for her blood pressure post delivery. Denies visual disturbances or epigastric pain. Pt reports new swelling in her feet over the past 2 days.  ? ?Onset of complaint: 4/14  ?Pain score: 2 - HA ?Vitals:  ? 03/13/22 2327  ?BP: (!) 164/86  ?Pulse: 76  ?Resp: 18  ?Temp: 98.2 ?F (36.8 ?C)  ?SpO2: 97%  ?   ? ?Lab orders placed from triage: none.  ? ?

## 2022-03-14 ENCOUNTER — Encounter (HOSPITAL_COMMUNITY)
Admission: RE | Admit: 2022-03-14 | Discharge: 2022-03-14 | Disposition: A | Discharge status: Home / Self Care | Payer: Medicaid Other | Source: Ambulatory Visit | Attending: Obstetrics and Gynecology | Admitting: Obstetrics and Gynecology

## 2022-03-14 DIAGNOSIS — O165 Unspecified maternal hypertension, complicating the puerperium: Secondary | ICD-10-CM

## 2022-03-14 LAB — COMPREHENSIVE METABOLIC PANEL
ALT: 28 U/L (ref 0–44)
AST: 20 U/L (ref 15–41)
Albumin: 2.4 g/dL — ABNORMAL LOW (ref 3.5–5.0)
Alkaline Phosphatase: 92 U/L (ref 38–126)
Anion gap: 8 (ref 5–15)
BUN: 10 mg/dL (ref 6–20)
CO2: 23 mmol/L (ref 22–32)
Calcium: 8.8 mg/dL — ABNORMAL LOW (ref 8.9–10.3)
Chloride: 109 mmol/L (ref 98–111)
Creatinine, Ser: 0.74 mg/dL (ref 0.44–1.00)
GFR, Estimated: >60 mL/min (ref >60)
Glucose, Bld: 97 mg/dL (ref 70–99)
Potassium: 3.3 mmol/L — ABNORMAL LOW (ref 3.5–5.1)
Sodium: 140 mmol/L (ref 135–145)
Total Bilirubin: 0.4 mg/dL (ref 0.3–1.2)
Total Protein: 5.7 g/dL — ABNORMAL LOW (ref 6.5–8.1)

## 2022-03-14 LAB — CBC
HCT: 29.5 % — ABNORMAL LOW (ref 36.0–46.0)
Hemoglobin: 9.5 g/dL — ABNORMAL LOW (ref 12.0–15.0)
MCH: 31.4 pg (ref 26.0–34.0)
MCHC: 32.2 g/dL (ref 30.0–36.0)
MCV: 97.4 fL (ref 80.0–100.0)
Platelets: 260 10*3/uL (ref 150–400)
RBC: 3.03 MIL/uL — ABNORMAL LOW (ref 3.87–5.11)
RDW: 13.4 % (ref 11.5–15.5)
WBC: 6.3 10*3/uL (ref 4.0–10.5)
nRBC: 0 % (ref 0.0–0.2)

## 2022-03-14 LAB — URINALYSIS, ROUTINE W REFLEX MICROSCOPIC
Bacteria, UA: NONE SEEN
Bilirubin Urine: NEGATIVE
Glucose, UA: NEGATIVE mg/dL
Ketones, ur: NEGATIVE mg/dL
Nitrite: NEGATIVE
Protein, ur: 30 mg/dL — AB
Specific Gravity, Urine: 1.031 — ABNORMAL HIGH (ref 1.005–1.030)
pH: 5 (ref 5.0–8.0)

## 2022-03-14 LAB — PROTEIN / CREATININE RATIO, URINE
Creatinine, Urine: 312.96 mg/dL
Protein Creatinine Ratio: 0.18 mg/mg{Cre} — ABNORMAL HIGH (ref 0.00–0.15)
Total Protein, Urine: 55 mg/dL

## 2022-03-14 MED ORDER — ACETAMINOPHEN 325 MG PO TABS
650.0000 mg | ORAL_TABLET | Freq: Once | ORAL | Status: AC
Start: 2022-03-14 — End: 2022-03-14
  Administered 2022-03-14: 650 mg via ORAL
  Filled 2022-03-14: qty 2

## 2022-03-14 MED ORDER — LABETALOL HCL 200 MG PO TABS
200.0000 mg | ORAL_TABLET | Freq: Two times a day (BID) | ORAL | 0 refills | Status: DC
Start: 2022-03-14 — End: 2022-03-15

## 2022-03-14 MED ORDER — ACETAMINOPHEN 325 MG PO TABS: 650.0000 mg | ORAL_TABLET | Freq: Once | ORAL | Status: DC

## 2022-03-14 MED ORDER — FUROSEMIDE 40 MG PO TABS
40.0000 mg | ORAL_TABLET | Freq: Once | ORAL | Status: AC
  Administered 2022-03-14: 40 mg via ORAL
  Filled 2022-03-14: qty 1

## 2022-03-14 MED ORDER — FUROSEMIDE 20 MG PO TABS: 20.0000 mg | ORAL_TABLET | Freq: Every day | ORAL | 0 refills | Status: DC

## 2022-03-14 MED ORDER — LABETALOL HCL 100 MG PO TABS
200.0000 mg | ORAL_TABLET | Freq: Once | ORAL | Status: AC
  Administered 2022-03-14: 200 mg via ORAL
  Filled 2022-03-14: qty 2

## 2022-03-15 ENCOUNTER — Ambulatory Visit: Payer: Medicaid Other | Admitting: Cardiology

## 2022-03-15 ENCOUNTER — Ambulatory Visit: Payer: Medicaid Other | Admitting: *Deleted

## 2022-03-15 VITALS — BP 151/86 | HR 69 | Ht 64.0 in | Wt 265.1 lb

## 2022-03-15 DIAGNOSIS — Z0130 Encounter for examination of blood pressure without abnormal findings: Secondary | ICD-10-CM

## 2022-03-15 DIAGNOSIS — Z4889 Encounter for other specified surgical aftercare: Secondary | ICD-10-CM

## 2022-03-15 MED ORDER — LABETALOL HCL 200 MG PO TABS: 200.0000 mg | ORAL_TABLET | Freq: Three times a day (TID) | ORAL | 1 refills | Status: DC

## 2022-03-15 NOTE — Progress Notes (Signed)
Pt presents for BP check and incision check following C/S on 03/08/22. She had MAU visit on 4/19 d/t elevated BP and was started on Labetalol as well as short course of Lasix.  She has occasional mild H/A - pain scale 1, however no H/A today. She denies visual disturbances.   BP - 151/86, P - 89.  Dr. Dione Plover informed of BP result and recommendation made to increase labetalol dose to 200 mg every 8 hours. Pt to return in 1 week for BP check. Incision was examined and found to be well-healed (skin edges closed) with no swelling, redness or drainage present. Instructions for proper hygiene and cleansing of the operative site were given. She will keep appt on 5/24 for PP visit as scheduled. Pt voiced understanding of all information and instructions given.  ?

## 2022-03-15 NOTE — Addendum Note (Signed)
Addended by: Jill Side on: 03/15/2022 12:34 PM ? ? Modules accepted: Orders ? ?

## 2022-03-18 ENCOUNTER — Encounter: Payer: Self-pay | Admitting: Family Medicine

## 2022-03-18 ENCOUNTER — Other Ambulatory Visit: Payer: Self-pay | Admitting: Family Medicine

## 2022-03-18 DIAGNOSIS — O133 Gestational [pregnancy-induced] hypertension without significant proteinuria, third trimester: Secondary | ICD-10-CM

## 2022-03-18 MED ORDER — LABETALOL HCL 300 MG PO TABS: 300.0000 mg | ORAL_TABLET | Freq: Three times a day (TID) | ORAL | 2 refills | Status: DC

## 2022-03-18 NOTE — Progress Notes (Signed)
Increase labetalol dose based on Baby scripts BP's ?

## 2022-03-21 ENCOUNTER — Encounter: Payer: Self-pay | Admitting: Obstetrics and Gynecology

## 2022-03-21 ENCOUNTER — Other Ambulatory Visit: Payer: Medicaid Other

## 2022-03-22 ENCOUNTER — Ambulatory Visit (INDEPENDENT_AMBULATORY_CARE_PROVIDER_SITE_OTHER): Payer: Medicaid Other | Admitting: *Deleted

## 2022-03-22 VITALS — BP 155/90 | HR 70 | Ht 64.0 in | Wt 253.5 lb

## 2022-03-22 DIAGNOSIS — O165 Unspecified maternal hypertension, complicating the puerperium: Secondary | ICD-10-CM

## 2022-03-22 MED ORDER — NIFEDIPINE ER OSMOTIC RELEASE 30 MG PO TB24: 30.0000 mg | ORAL_TABLET | Freq: Every day | ORAL | 0 refills | Status: DC

## 2022-03-22 NOTE — Progress Notes (Signed)
Here for BP check after c/s 03/08/22. Had MAU visit 4/19 with elevated BP and placed on labetolol . Then had nurse visit for bp check on 03/15/22 and was 151/86 and labetolol was increased to 200 mg every 8 hours. Then Dr. Dione Plover reviewed babyscripts and messaged Viktorya  4/24 to increase to 300 mg every 8 hours.  ?She states at home 4/24-4/26 was good , about 130/82. Did have headache most of day 4/27 =4 , resolved with sleep. ?Today no headache, no edema, BP 160/102, repeat bp 155/90. Reported to Dr. Kennon Rounds. Ordered to add procardia 30 xl daily. Continue doing home bp and enter in Babyscripts and they will review via Babyscripts. Also keep postpartum visit as scheduled. ?Informed patient of new plan of care and when to contact us vs go to hospital. She voices understanding. ?Staci Acosta ?

## 2022-03-23 ENCOUNTER — Encounter: Payer: Self-pay | Admitting: Family Medicine

## 2022-03-23 DIAGNOSIS — O165 Unspecified maternal hypertension, complicating the puerperium: Secondary | ICD-10-CM

## 2022-03-25 MED ORDER — NIFEDIPINE ER OSMOTIC RELEASE 30 MG PO TB24: 30.0000 mg | ORAL_TABLET | Freq: Three times a day (TID) | ORAL | 2 refills | Status: DC

## 2022-03-25 NOTE — Addendum Note (Signed)
Addended by: Merian Capron on: 03/25/2022 11:28 AM ? ? Modules accepted: Orders ? ?

## 2022-03-27 ENCOUNTER — Encounter: Payer: Self-pay | Admitting: Obstetrics & Gynecology

## 2022-03-28 ENCOUNTER — Ambulatory Visit: Payer: Medicaid Other

## 2022-04-01 DIAGNOSIS — Z98891 History of uterine scar from previous surgery: Secondary | ICD-10-CM

## 2022-04-10 ENCOUNTER — Other Ambulatory Visit: Payer: Self-pay | Admitting: Advanced Practice Midwife

## 2022-04-17 ENCOUNTER — Ambulatory Visit (INDEPENDENT_AMBULATORY_CARE_PROVIDER_SITE_OTHER): Payer: Medicaid Other | Admitting: Family Medicine

## 2022-04-17 ENCOUNTER — Encounter: Payer: Self-pay | Admitting: Family Medicine

## 2022-04-17 DIAGNOSIS — Z9079 Acquired absence of other genital organ(s): Secondary | ICD-10-CM

## 2022-04-17 DIAGNOSIS — Z8759 Personal history of other complications of pregnancy, childbirth and the puerperium: Secondary | ICD-10-CM | POA: Diagnosis not present

## 2022-04-17 DIAGNOSIS — Z0000 Encounter for general adult medical examination without abnormal findings: Secondary | ICD-10-CM

## 2022-04-17 DIAGNOSIS — Z124 Encounter for screening for malignant neoplasm of cervix: Secondary | ICD-10-CM

## 2022-04-17 DIAGNOSIS — Z98891 History of uterine scar from previous surgery: Secondary | ICD-10-CM

## 2022-04-17 NOTE — Progress Notes (Signed)
Post Partum Visit Note  Paula [name] is a 31 y.o. [phone] female who presents for a postpartum visit. She is 5 weeks postpartum following a repeat cesarean section.  I have fully reviewed the prenatal and intrapartum course. The delivery was at [redacted]w[redacted]d gestational weeks.  Anesthesia: spinal. Postpartum course has been well. Baby is doing well. Baby is feeding by breast. Bleeding no bleeding. Bowel function is normal. Bladder function is normal. Patient is not sexually active. Contraception method is  bilateral salpingectomy . Postpartum depression screening: negative.   The pregnancy intention screening data noted above was reviewed. Potential methods of contraception were discussed. The patient elected to proceed with No data recorded.   Edinburgh Postnatal Depression Scale - 04/17/22 1028       Edinburgh Postnatal Depression Scale:  In the Past 7 Days   I have been able to laugh and see the funny side of things. 0    I have looked forward with enjoyment to things. 0    I have blamed myself unnecessarily when things went wrong. 0    I have been anxious or worried for no good reason. 0    I have felt scared or panicky for no good reason. 0    Things have been getting on top of me. 0    I have been so unhappy that I have had difficulty sleeping. 0    I have felt sad or miserable. 0    I have been so unhappy that I have been crying. 0    The thought of harming myself has occurred to me. 0    Edinburgh Postnatal Depression Scale Total 0             Health Maintenance Due  Topic Date Due   Hepatitis C Screening  Never done   PAP SMEAR-Modifier  Never done    The following portions of the patient's history were reviewed and updated as appropriate: allergies, current medications, past family history, past medical history, past social history, past surgical history, and problem list.  Review of Systems Pertinent items noted in HPI and remainder of comprehensive ROS  otherwise negative.  Objective:  BP 137/86   Pulse 76   Ht 5\' 4"  (1.626 m)   Wt 244 lb 9.6 oz (110.9 kg)   LMP 06/08/2021 Comment: +HPT, neg at [facility] today  Breastfeeding Yes   BMI 41.99 kg/m    General:  alert, cooperative, and appears stated age   Breasts:  not indicated  Lungs: Comfortable on room air       Assessment:    There are no diagnoses linked to this encounter.  Normal postpartum exam.   Plan:   Essential components of care per ACOG recommendations:  1.  Mood and well being: Patient with negative depression screening today. Reviewed local resources for support.  - Patient tobacco use? No.   - hx of drug use? No.    2. Infant care and feeding:  -Patient currently breastmilk feeding? Yes. Reviewed importance of draining breast regularly to support lactation.  -Social determinants of health (SDOH) reviewed in EPIC. No concerns  3. Sexuality, contraception and birth spacing - Patient does not want a pregnancy in the next year.  Desired family size is 2 children.  - Reviewed reproductive life planning. Reviewed contraceptive methods based on pt preferences and effectiveness.  Patient s/p bilateral salpingectomy.    4. Sleep and fatigue -Encouraged family/partner/community support of 4 hrs of uninterrupted sleep to  help with mood and fatigue  5. Physical Recovery  - Discussed patients delivery and complications. She describes her labor as good. - Patient had a C-section repeat; no problems after deliver.  - Patient has urinary incontinence? No. - Patient is safe to resume physical and sexual activity  6.  Health Maintenance - HM due items addressed Yes - Last pap smear: 07/03/2020, NILM, neg HPV ([facility], she will send result through MyChart) Pap smear not done at today's visit.  -Breast Cancer screening indicated? No.   7. Chronic Disease/Pregnancy Condition follow up: Hypertension Gestational hypertension: still taking procardia 30 mg  TID, will have her stop and return in 1-2 weeks for BP check - PCP follow up  Venora Maples, MD [facility]

## 2022-04-17 NOTE — Addendum Note (Signed)
Addended by: Merian Capron on: 04/17/2022 11:37 AM   Modules accepted: Orders

## 2022-05-01 ENCOUNTER — Ambulatory Visit (INDEPENDENT_AMBULATORY_CARE_PROVIDER_SITE_OTHER): Payer: Medicaid Other | Admitting: Pharmacist

## 2022-05-01 VITALS — BP 131/79 | Wt 250.4 lb

## 2022-05-01 DIAGNOSIS — Z0130 Encounter for examination of blood pressure without abnormal findings: Secondary | ICD-10-CM

## 2022-05-01 NOTE — Progress Notes (Signed)
Postpartum Blood Pressure Check  Patient: Edwina Grossberg MRN: [id number] Date of Birth: 25-May-1991 PCP: Pcp, No   Anamae Tumeka Chimenti 31 y.o. female presents for a postpartum blood pressure check visit today. Delivered 03/08/2022 and was on procardia 30mg  TID. Was told to stop taking procardia at last visit ~2 weeks ago.  Repeat BP 131/79 mmHg (BP Location: Left Arm, Patient Position: Sitting, Cuff Size: Normal) BP 139/84 (BP Location: Left Arm, Patient Position: Sitting, Cuff Size: Normal)   Wt 250 lb 6.4 oz (113.6 kg)   LMP 06/08/2021 Comment: +HPT, neg at [facility] today  BMI 42.98 kg/m   Patient Information   Past Medical History:  Diagnosis Date   Chlamydia 2011   Hypertension    Gestatoinal   Medical history non-contributory       Past Surgical History:  Procedure Laterality Date   CESAREAN SECTION N/A 05/28/2016   Procedure: CESAREAN SECTION;  Surgeon: 07/29/2016, MD;  Location: [facility] BIRTHING SUITES;  Service: Obstetrics;  Laterality: N/A;   CESAREAN SECTION WITH BILATERAL TUBAL LIGATION Bilateral 03/08/2022   Procedure: CESAREAN SECTION WITH BILATERAL TUBAL LIGATION;  Surgeon: 03/10/2022, DO;  Location: [facility] LD ORS;  Service: Obstetrics;  Laterality: Bilateral;   WISDOM TOOTH EXTRACTION      Assessment and Plan: BP today is normal at 131/79 mmHg. Patient has no complaints. She endorses occasional headaches resolved with acetaminophen. Discussed patient with Dr. Myna Hidalgo who is okay to have patient stop Procardia. Counseled patient to continue monitoring her blood pressure at home and should it become elevated or she has other concerns she should follow-up with her PCP.  Thanks for allowing pharmacy to be a part of this patient's care. Para March, PharmD, BCPPS

## 2022-07-17 ENCOUNTER — Emergency Department (HOSPITAL_BASED_OUTPATIENT_CLINIC_OR_DEPARTMENT_OTHER): Payer: Medicaid Other

## 2022-07-17 ENCOUNTER — Emergency Department (HOSPITAL_BASED_OUTPATIENT_CLINIC_OR_DEPARTMENT_OTHER)
Admission: EM | Admit: 2022-07-17 | Discharge: 2022-07-17 | Disposition: A | Discharge status: Home / Self Care | Payer: Medicaid Other | Attending: Emergency Medicine | Admitting: Emergency Medicine

## 2022-07-17 ENCOUNTER — Encounter (HOSPITAL_BASED_OUTPATIENT_CLINIC_OR_DEPARTMENT_OTHER): Payer: Self-pay

## 2022-07-17 DIAGNOSIS — S0990XA Unspecified injury of head, initial encounter: Secondary | ICD-10-CM | POA: Diagnosis not present

## 2022-07-17 DIAGNOSIS — Y9339 Activity, other involving climbing, rappelling and jumping off: Secondary | ICD-10-CM | POA: Diagnosis not present

## 2022-07-17 DIAGNOSIS — W01198A Fall on same level from slipping, tripping and stumbling with subsequent striking against other object, initial encounter: Secondary | ICD-10-CM | POA: Diagnosis not present

## 2022-07-17 MED ORDER — ACETAMINOPHEN 500 MG PO TABS
1000.0000 mg | ORAL_TABLET | Freq: Once | ORAL | Status: AC
Start: 2022-07-17 — End: 2022-07-17
  Administered 2022-07-17: 1000 mg via ORAL
  Filled 2022-07-17: qty 2

## 2022-07-17 NOTE — ED Triage Notes (Signed)
Patient here POV from Home.  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  Evaluated by UC on Monday but endorses continued Symptoms and Worsening Headache.   No Known LOC. No Anticoagulants.   NAD Noted during Triage. A&Ox4. GCS 15. Ambulatory.

## 2022-07-17 NOTE — ED Notes (Signed)
Patient verbalizes understanding of discharge instructions. Opportunity for questioning and answers were provided. Patient discharged from ED.  °

## 2022-07-17 NOTE — ED Provider Notes (Signed)
[facility] EMERGENCY DEPT Provider Note   CSN: [id number] Arrival date & time: 07/17/22  1332     History  Chief Complaint  Patient presents with   Paula [name] is a 31 y.o. female.  Patient is a 31 year old female who presents after a fall.  She said that 2 days ago she was at [employer] and was jumping on pogo stick and fell backward hitting her neck on a shelf.  She been having some neck pain and was seen in urgent care for this.  She was prescribed some medications for that and overall says that her neck is feeling better.  She said that she started having a worsening headache yesterday and today it got even worse.  In the back of her head and then sometimes radiates to the front.  No nausea or vomiting.  No dizziness.  No radiation of her neck pain down her arms.  No numbness or weakness to her extremities.  No other injuries from the fall.       Home Medications Prior to Admission medications   Medication Sig Start Date End Date Taking? Authorizing Provider  Multiple Vitamin (MULTIVITAMIN) tablet Take 1 tablet by mouth daily. Gummy    [provider]  albuterol (PROVENTIL) (2.5 MG/3ML) 0.083% nebulizer solution Take 3 mLs (2.5 mg total) by nebulization every 4 (four) hours as needed for wheezing or shortness of breath. 06/07/16 10/23/19  Silverio Lay, MD  levonorgestrel (MIRENA) 20 MCG/24HR IUD 1 each by Intrauterine route once.  10/23/19  [provider]      Allergies    Patient has no known allergies.    Review of Systems   Review of Systems  Constitutional:  Negative for fever.  Gastrointestinal:  Negative for nausea and vomiting.  Musculoskeletal:  Positive for neck pain. Negative for arthralgias, back pain and joint swelling.  Skin:  Negative for wound.  Neurological:  Positive for headaches. Negative for weakness and numbness.    Physical Exam Updated Vital Signs BP (!) 140/82 (BP Location: Right Arm)   Pulse  76   Temp 98.7 F (37.1 C)   Resp 19   Ht 5\' 4"  (1.626 m)   Wt 113.6 kg   SpO2 100%   BMI 42.99 kg/m  Physical Exam Constitutional:      Appearance: She is well-developed.  HENT:     Head: Normocephalic and atraumatic.  Eyes:     Extraocular Movements: Extraocular movements intact.     Pupils: Pupils are equal, round, and reactive to light.  Neck:     Comments: No tenderness to the cervical, thoracic or lumbosacral spine Cardiovascular:     Rate and Rhythm: Normal rate and regular rhythm.     Heart sounds: Normal heart sounds.  Pulmonary:     Effort: Pulmonary effort is normal. No respiratory distress.     Breath sounds: Normal breath sounds. No wheezing or rales.  Chest:     Chest wall: No tenderness.  Abdominal:     General: Bowel sounds are normal.     Palpations: Abdomen is soft.     Tenderness: There is no abdominal tenderness. There is no guarding or rebound.  Musculoskeletal:        General: Normal range of motion.     Cervical back: Normal range of motion and neck supple.  Lymphadenopathy:     Cervical: No cervical adenopathy.  Skin:    General: Skin is warm and dry.  Findings: No rash.  Neurological:     General: No focal deficit present.     Mental Status: She is alert and oriented to person, place, and time.     ED Results / Procedures / Treatments   Labs (all labs ordered are listed, but only abnormal results are displayed) Labs Reviewed - No data to display  EKG None  Radiology CT Head Wo Contrast  Result Date: 07/17/2022 CLINICAL DATA:  Patient states she was on focused thick and falling at the store, fell and hit her posterior neck on shelf. EXAM: CT HEAD WITHOUT CONTRAST TECHNIQUE: Contiguous axial images were obtained from the base of the skull through the vertex without intravenous contrast. RADIATION DOSE REDUCTION: This exam was performed according to the departmental dose-optimization program which includes automated exposure control,  adjustment of the mA and/or kV according to patient size and/or use of iterative reconstruction technique. COMPARISON:  None Available. FINDINGS: Brain: No evidence of acute infarction, hemorrhage, hydrocephalus, extra-axial collection or mass lesion/mass effect. Vascular: No hyperdense vessel or unexpected calcification. Skull: Normal. Negative for fracture or focal lesion. Sinuses/Orbits: No acute finding. Other: None. IMPRESSION: No acute intracranial abnormality. Electronically Signed   By: Larose Hires D.O.   On: 07/17/2022 14:59    Procedures Procedures    Medications Ordered in ED Medications  acetaminophen (TYLENOL) tablet 1,000 mg (1,000 mg Oral Given 07/17/22 1501)    ED Course/ Medical Decision Making/ A&P                           Medical Decision Making Amount and/or Complexity of Data Reviewed Radiology: ordered.  Risk OTC drugs.   Patient presents with a head injury 2 days ago with worsening headache.  CT scan was performed and shows no evidence of intracranial hemorrhage or skull fracture.  She is neurologically intact.  She had initially had some neck pain but her neck pain has markedly improved and she does not have any tenderness on palpation of her cervical spine.  Given this, I did not feel that imaging was indicated of her cervical spine.  She was discharged home in good condition.  Return precautions were given.  Final Clinical Impression(s) / ED Diagnoses Final diagnoses:  Injury of head, initial encounter    Rx / DC Orders ED Discharge Orders     None         Rolan Bucco, MD 07/17/22 1521

## 2022-08-15 NOTE — Telephone Encounter (Signed)
Open in error

## 2022-09-17 ENCOUNTER — Ambulatory Visit (INDEPENDENT_AMBULATORY_CARE_PROVIDER_SITE_OTHER): Payer: Medicaid Other | Admitting: Family

## 2022-09-17 ENCOUNTER — Encounter: Payer: Self-pay | Admitting: Family

## 2022-09-17 VITALS — BP 126/90 | HR 75 | Temp 97.0°F | Resp 17 | Ht 64.0 in | Wt 235.4 lb

## 2022-09-17 DIAGNOSIS — Z6841 Body Mass Index (BMI) 40.0 and over, adult: Secondary | ICD-10-CM

## 2022-09-17 DIAGNOSIS — Z7689 Persons encountering health services in other specified circumstances: Secondary | ICD-10-CM

## 2022-09-17 DIAGNOSIS — R0981 Nasal congestion: Secondary | ICD-10-CM | POA: Diagnosis not present

## 2022-09-17 DIAGNOSIS — Z13220 Encounter for screening for lipoid disorders: Secondary | ICD-10-CM | POA: Diagnosis not present

## 2022-09-17 DIAGNOSIS — I10 Essential (primary) hypertension: Secondary | ICD-10-CM

## 2022-09-17 MED ORDER — FLUTICASONE PROPIONATE 50 MCG/ACT NA SUSP: 2.0000 | Freq: Every day | NASAL | 6 refills | Status: DC

## 2022-09-17 NOTE — Progress Notes (Signed)
Provider: Marlowe Sax FNP-C   Minervia Osso, Nelda Bucks, NP  Patient Care Team: Ronak Duquette, Nelda Bucks, NP as PCP - General (Family Medicine)  Extended Emergency Contact Information Primary Emergency Contact: Waldorf,Maureen Address: [phone] [address] [place] Phone: [phone] Relation: Mother Secondary Emergency Contact: Mable [place] Mobile Phone: [phone] Relation: Significant other  Code Status:  Full Code  Goals of care: Advanced Directive information    09/17/2022   10:40 AM  Advanced Directives  Does Patient Have a Medical Advance Directive? No  Would patient like information on creating a medical advance directive? No - Patient declined     Chief Complaint  Patient presents with   Establish Care    New patient.     HPI:  Pt is a 31 y.o. female seen today establish care here at [facility] for medical management of chronic diseases.Has a medical history of gestational Hypertension,GERD,Obesity,Anemia,lower back pain with left sciatic pain among others. State hasn't had a PCP for several years.Used to follow up with Obstetric and Gynecology during and after her pregnancy.  Had high blood pressure after delivery of her baby in April,2023.states was treated with two blood pressure but wean off and was told to discontinued.Recalls one blood pressure pill begins with an " L".Had high blood pressure with her First born during pregnancy.  Checks blood pressure every once in a while whenever she goes to [facility].Has had occasional headache but nor sure whether it's due to her nasal congestion. She denies any dizziness,vision changes,fatigue,chest tightness,palpitation,chest pain or shortness of breath.    GERD - Occasional symptoms worst when she eats Spaghetti and [employer].   Has tried to cook more at home but diet still includes fried foods,Eats 1- 2 meals usually lunch and  dinner then snacks in between.does not get time to eat breakfast since she drops her daughter to school then comes back does her chores and cares for the baby before she knows it it's time for her lunch.  Not drinking enough water.Does drink a Coke soda every day.   She does exercise by walking about half a mile not every day.   Fatigue - had low hemoglobin during pregnancy.Has been on Iron supplement. On chart,review had normocytic anemia hgb 9.5 ( 03/14/2022 ).   Had tubes tied after pregnancy.    Past Medical History:  Diagnosis Date   Chlamydia 2011   History of Papanicolaou smear of cervix    Hypertension    Gestatoinal   Low iron    Medical history non-contributory    Sciatica    Past Surgical History:  Procedure Laterality Date   CESAREAN SECTION N/A 05/28/2016   Procedure: CESAREAN SECTION;  Surgeon: Delsa Bern, MD;  Location: [facility];  Service: Obstetrics;  Laterality: N/A;   CESAREAN SECTION WITH BILATERAL TUBAL LIGATION Bilateral 03/08/2022   Procedure: CESAREAN SECTION WITH BILATERAL TUBAL LIGATION;  Surgeon: Janyth Pupa, DO;  Location: [facility];  Service: Obstetrics;  Laterality: Bilateral;   WISDOM TOOTH EXTRACTION      No Known Allergies  Allergies as of 09/17/2022   No Known Allergies      Medication List        Accurate as of September 17, 2022 11:11 AM. If you have any questions, ask your nurse or doctor.          STOP taking these  medications    multivitamin tablet Stopped by: Sandrea Hughs, NP        Review of Systems  Constitutional:  Negative for appetite change, chills, fatigue, fever and unexpected weight change.  HENT:  Negative for congestion, dental problem, ear discharge, ear pain, facial swelling, hearing loss, nosebleeds, postnasal drip, rhinorrhea, sinus pressure, sinus pain, sneezing, sore throat, tinnitus and trouble swallowing.   Eyes:  Negative for pain, discharge, redness, itching and visual disturbance.   Respiratory:  Negative for cough, chest tightness, shortness of breath and wheezing.   Cardiovascular:  Negative for chest pain, palpitations and leg swelling.  Gastrointestinal:  Negative for abdominal distention, abdominal pain, blood in stool, constipation, diarrhea, nausea and vomiting.  Endocrine: Negative for cold intolerance, heat intolerance, polydipsia, polyphagia and polyuria.  Genitourinary:  Negative for difficulty urinating, dysuria, flank pain, frequency and urgency.  Musculoskeletal:  Positive for arthralgias and back pain. Negative for gait problem, joint swelling, myalgias, neck pain and neck stiffness.       Left sciatic pain and back pain   Skin:  Negative for color change, pallor, rash and wound.  Neurological:  Negative for dizziness, syncope, speech difficulty, weakness, light-headedness, numbness and headaches.  Hematological:  Does not bruise/bleed easily.  Psychiatric/Behavioral:  Negative for agitation, behavioral problems, confusion, hallucinations, self-injury, sleep disturbance and suicidal ideas. The patient is not nervous/anxious.     Immunization History  Administered Date(s) Administered   Adenovirus 08/15/2011   HPV 9-valent 04/20/2014   Hep A / Hep B 08/15/2011   IPV 08/15/2011   Influenza Nasal 08/15/2011   Meningococcal Conjugate 08/15/2011   Tdap 08/15/2011, 05/16/2016, 01/23/2022   Pertinent  Health Maintenance Due  Topic Date Due   INFLUENZA VACCINE  06/25/2022   PAP SMEAR-Modifier  07/04/2023      03/10/2022   10:03 AM 03/10/2022    8:24 PM 03/11/2022    9:00 AM 07/17/2022    1:48 PM 09/17/2022   10:40 AM  Fall Risk  Falls in the past year?     0  Was there an injury with Fall?     0  Fall Risk Category Calculator     0  Fall Risk Category     Low  Patient Fall Risk Level _0   Patient at Risk for Falls Due to     No Fall Risks  Fall risk Follow up     Falls evaluation  completed   Functional Status Survey:    Vitals:   09/17/22 1033  BP: (!) 126/90  Pulse: 75  Resp: 17  Temp: (!) 97 F (36.1 C)  SpO2: 97%  Weight: 235 lb 6.4 oz (106.8 kg)  Height: _1  (1.626 m)   Body mass index is 40.41 kg/m. Physical Exam Vitals reviewed.  Constitutional:      General: She is not in acute distress.    Appearance: Normal appearance. She is obese. She is not ill-appearing or diaphoretic.  HENT:     Head: Normocephalic.     Right Ear: Tympanic membrane, ear canal and external ear normal. There is no impacted cerumen.     Left Ear: Tympanic membrane, ear canal and external ear normal. There is no impacted cerumen.     Nose: Nose normal. No congestion or rhinorrhea.     Mouth/Throat:     Mouth: Mucous membranes are moist.     Pharynx: Oropharynx is clear. No oropharyngeal  exudate or posterior oropharyngeal erythema.  Eyes:     General: No scleral icterus.       Right eye: No discharge.        Left eye: No discharge.     Extraocular Movements: Extraocular movements intact.     Conjunctiva/sclera: Conjunctivae normal.     Pupils: Pupils are equal, round, and reactive to light.  Neck:     Vascular: No carotid bruit.  Cardiovascular:     Rate and Rhythm: Normal rate and regular rhythm.     Pulses: Normal pulses.     Heart sounds: Normal heart sounds. No murmur heard.    No friction rub. No gallop.  Pulmonary:     Effort: Pulmonary effort is normal. No respiratory distress.     Breath sounds: Normal breath sounds. No wheezing, rhonchi or rales.  Chest:     Chest wall: No tenderness.  Abdominal:     General: Bowel sounds are normal. There is no distension.     Palpations: Abdomen is soft. There is no mass.     Tenderness: There is no abdominal tenderness. There is no right CVA tenderness, left CVA tenderness, guarding or rebound.  Musculoskeletal:        General: No swelling or tenderness. Normal range of motion.     Cervical back: Normal range of  motion. No rigidity or tenderness.     Right lower leg: No edema.     Left lower leg: No edema.  Lymphadenopathy:     Cervical: No cervical adenopathy.  Skin:    General: Skin is warm and dry.     Coloration: Skin is not pale.     Findings: No bruising, erythema, lesion or rash.  Neurological:     Mental Status: She is alert and oriented to person, place, and time.     Cranial Nerves: No cranial nerve deficit.     Sensory: No sensory deficit.     Motor: No weakness.     Coordination: Coordination normal.     Gait: Gait normal.  Psychiatric:        Mood and Affect: Mood normal.        Speech: Speech normal.        Behavior: Behavior normal.        Thought Content: Thought content normal.        Judgment: Judgment normal.     Labs reviewed: Recent Labs    03/02/22 1924 03/07/22 1817 03/09/22 0534 03/14/22 0054  NA 136 136  --  140  K 3.4* 3.6  --  3.3*  CL 105 107  --  109  CO2 22 22  --  23  GLUCOSE 101* 77  --  97  BUN 6 5*  --  10  CREATININE 0.67 0.60 0.74 0.74  CALCIUM 8.9 8.9  --  8.8*   Recent Labs    03/02/22 1924 03/07/22 1817 03/14/22 0054  AST 17 14* 20  ALT _0 ALKPHOS 110 115 92  BILITOT 0.4 0.5 0.4  PROT 6.1* 6.3* 5.7*  ALBUMIN 2.5* 2.5* 2.4*   Recent Labs    02/20/22 0937 03/02/22 1924 03/07/22 1817 03/09/22 0534 03/14/22 0054  WBC 9.1   < > 8.8 13.2* 6.3  NEUTROABS 6.7  --   --   --   --   HGB 9.9*   < > 10.0* 9.3* 9.5*  HCT 29.2*   < > 30.1* 27.4* 29.5*  MCV 93.0   < >  95.0 93.2 97.4  PLT 193   < > 198 197 260   < > = values in this interval not displayed.   No results found for: "TSH" No results found for: "HGBA1C" No results found for: "CHOL", "HDL", "LDLCALC", "LDLDIRECT", "TRIG", "CHOLHDL"  Significant Diagnostic Results in last 30 days:  No results found.  Assessment/Plan 1. Primary hypertension Diastolic elevated today.Has had high blood pressure during and after pregnancy.currently off B/p meds. - Advised to  check Blood pressure at home and record on log provided and notify provider if B/p > 140/90   - will follow up in 2 weeks to recheck B/p if still high then start on amlodipine.  - advised to reduce salt intake  - Increase water intake  - TSH - COMPLETE METABOLIC PANEL WITH GFR - CBC with Differential/Platelet - For home use only DME Other see comment  2. Screening for hyperlipidemia No recent lab for review  - Lipid panel  3. Encounter to establish care Available medical records reviewed.will need to verify with previous records for screening for Hep C then will update records.  4. Nasal congestion Bilateral nares mucosa edema.  - advised to use Flonase as below. Saline samplle given during visit for saline rinse.  - fluticasone (FLONASE) 50 MCG/ACT nasal spray; Place 2 sprays into both nostrils daily.  Dispense: 16 g; Refill: 6  5. Body mass index (BMI) of 40.1-44.9 in adult (HCC) BMI 40.41  Dietary modification and exercise discussed at length.  Family/ staff Communication: Reviewed plan of care with patient verbalized understanding.   Labs/tests ordered:   - CBC with Differential/Platelet - CMP with eGFR([facility]) - TSH - Lipid panel  Next Appointment : Return in about 2 weeks (around 10/01/2022) for Blood pressure .   Sandrea Hughs, NP

## 2022-09-18 LAB — CBC WITH DIFFERENTIAL/PLATELET
Absolute Monocytes: 317 cells/uL (ref 200–950)
Basophils Absolute: 18 cells/uL (ref 0–200)
Basophils Relative: 0.4 %
Eosinophils Absolute: 32 cells/uL (ref 15–500)
Eosinophils Relative: 0.7 %
HCT: 35.9 % (ref 35.0–45.0)
Hemoglobin: 11.7 g/dL (ref 11.7–15.5)
Lymphs Abs: 2226 cells/uL (ref 850–3900)
MCH: 28.7 pg (ref 27.0–33.0)
MCHC: 32.6 g/dL (ref 32.0–36.0)
MCV: 88.2 fL (ref 80.0–100.0)
MPV: 12 fL (ref 7.5–12.5)
Monocytes Relative: 6.9 %
Neutro Abs: 2006 cells/uL (ref 1500–7800)
Neutrophils Relative %: 43.6 %
Platelets: 274 10*3/uL (ref 140–400)
RBC: 4.07 10*6/uL (ref 3.80–5.10)
RDW: 13.3 % (ref 11.0–15.0)
Total Lymphocyte: 48.4 %
WBC: 4.6 10*3/uL (ref 3.8–10.8)

## 2022-09-18 LAB — LIPID PANEL
Cholesterol: 261 mg/dL — ABNORMAL HIGH (ref <200)
HDL: 52 mg/dL (ref >=50)
LDL Cholesterol (Calc): 181 mg/dL (calc) — ABNORMAL HIGH
Non-HDL Cholesterol (Calc): 209 mg/dL (calc) — ABNORMAL HIGH (ref <130)
Total CHOL/HDL Ratio: 5 (calc) — ABNORMAL HIGH (ref <5.0)
Triglycerides: 138 mg/dL (ref <150)

## 2022-09-18 LAB — COMPLETE METABOLIC PANEL WITH GFR
AG Ratio: 1.5 (calc) (ref 1.0–2.5)
ALT: 9 U/L (ref 6–29)
AST: 12 U/L (ref 10–30)
Albumin: 4.1 g/dL (ref 3.6–5.1)
Alkaline phosphatase (APISO): 81 U/L (ref 31–125)
BUN: 8 mg/dL (ref 7–25)
CO2: 29 mmol/L (ref 20–32)
Calcium: 9.2 mg/dL (ref 8.6–10.2)
Chloride: 104 mmol/L (ref 98–110)
Creat: 0.79 mg/dL (ref 0.50–0.97)
Globulin: 2.8 g/dL (calc) (ref 1.9–3.7)
Glucose, Bld: 86 mg/dL (ref 65–99)
Potassium: 4.1 mmol/L (ref 3.5–5.3)
Sodium: 140 mmol/L (ref 135–146)
Total Bilirubin: 0.5 mg/dL (ref 0.2–1.2)
Total Protein: 6.9 g/dL (ref 6.1–8.1)
eGFR: 101 mL/min/{1.73_m2} (ref >=60)

## 2022-09-18 LAB — TSH: TSH: 1.92 mIU/L

## 2022-10-01 ENCOUNTER — Encounter: Payer: Self-pay | Admitting: Family

## 2022-10-01 ENCOUNTER — Ambulatory Visit (INDEPENDENT_AMBULATORY_CARE_PROVIDER_SITE_OTHER): Payer: Medicaid Other | Admitting: Family

## 2022-10-01 VITALS — BP 126/88 | HR 80 | Temp 97.1°F | Resp 18 | Ht 64.0 in | Wt 252.6 lb

## 2022-10-01 DIAGNOSIS — E785 Hyperlipidemia, unspecified: Secondary | ICD-10-CM | POA: Diagnosis not present

## 2022-10-01 DIAGNOSIS — I10 Essential (primary) hypertension: Secondary | ICD-10-CM | POA: Diagnosis not present

## 2022-10-01 NOTE — Patient Instructions (Signed)
-   check Blood pressure at home and record on log provided and notify provider if B/p > 140/90  ? ?

## 2022-10-01 NOTE — Progress Notes (Signed)
Provider: Marlowe Sax FNP-C  Aroush Chasse, Nelda Bucks, NP  Patient Care Team: Sekou Zuckerman, Nelda Bucks, NP as PCP - General (Family Medicine)  Extended Emergency Contact Information Primary Emergency Contact: Kobayashi,Maureen Address: [phone] [address] [place] Phone: [phone] Relation: Mother Secondary Emergency Contact: Bohlin,Charnelle Mobile Phone: [phone] Relation: Sister Preferred language: English Interpreter needed? No  Code Status:  Full Code  Goals of care: Advanced Directive information    10/01/2022    9:29 AM  Advanced Directives  Does Patient Have a Medical Advance Directive? No  Would patient like information on creating a medical advance directive? No - Patient declined     Chief Complaint  Patient presents with   Follow-up    2 week follow up on blood pressure.     HPI:  Pt is a 31 y.o. female seen today for an acute visit for 2 weeks follow up high blood pressure.  She was here on 09/17/2022 to establish care with provider blood pressure was slightly elevated.  Stated had high blood pressure during pregnancy but was currently off medication.  She was advised to check blood pressure at home and keep log then bring to visit today if blood pressure elevated then will start on amlodipine.Dietary modification  and exercise was also advised.  She brought blood pressure log to visit today.Blood pressure readings in the 120's /80's - 130's/80's  with few above 140 /90's and heart rates running in the 60's to 80's.Had two occasions of minor headaches but resolved. She denies any headache,dizziness,vision changes,fatigue,chest tightness,palpitation,chest pain or shortness of breath.         Past Medical History:  Diagnosis Date   Chlamydia 2011   History of Papanicolaou smear of cervix    Hypertension    Gestatoinal   Low iron    Medical history non-contributory    Sciatica    Past Surgical History:   Procedure Laterality Date   CESAREAN SECTION N/A 05/28/2016   Procedure: CESAREAN SECTION;  Surgeon: Delsa Bern, MD;  Location: [place] [facility];  Service: Obstetrics;  Laterality: N/A;   CESAREAN SECTION WITH BILATERAL TUBAL LIGATION Bilateral 03/08/2022   Procedure: CESAREAN SECTION WITH BILATERAL TUBAL LIGATION;  Surgeon: Janyth Pupa, DO;  Location: [place] [facility];  Service: Obstetrics;  Laterality: Bilateral;   WISDOM TOOTH EXTRACTION      No Known Allergies  Outpatient Encounter Medications as of 10/01/2022  Medication Sig   albuterol (PROVENTIL) (2.5 MG/3ML) 0.083% nebulizer solution Take 2.5 mg by nebulization every 6 (six) hours as needed for wheezing or shortness of breath.   fluticasone (FLONASE) 50 MCG/ACT nasal spray Place 2 sprays into both nostrils daily.   [DISCONTINUED] albuterol (PROVENTIL) (2.5 MG/3ML) 0.083% nebulizer solution Take 3 mLs (2.5 mg total) by nebulization every 4 (four) hours as needed for wheezing or shortness of breath.   [DISCONTINUED] levonorgestrel (MIRENA) 20 MCG/24HR IUD 1 each by Intrauterine route once.   No facility-administered encounter medications on file as of 10/01/2022.    Review of Systems  Constitutional:  Negative for appetite change, chills, fatigue, fever and unexpected weight change.  HENT:  Negative for congestion, ear discharge, ear pain, facial swelling, hearing loss, nosebleeds, postnasal drip, rhinorrhea, sinus pressure, sinus pain, sneezing and sore throat.   Eyes:  Negative for pain, discharge, redness, itching and visual disturbance.  Respiratory:  Negative for cough, chest tightness, shortness of breath and wheezing.   Cardiovascular:  Negative for chest pain, palpitations and leg swelling.  Gastrointestinal:  Negative for abdominal distention, abdominal pain, blood in stool, constipation, diarrhea, nausea and vomiting.  Endocrine: Negative for cold intolerance, heat intolerance, polydipsia, polyphagia and polyuria.   Genitourinary:  Negative for difficulty urinating, dysuria, flank pain, frequency and urgency.  Skin:  Negative for color change, pallor, rash and wound.  Neurological:  Negative for dizziness, syncope, speech difficulty, weakness, light-headedness, numbness and headaches.  Hematological:  Does not bruise/bleed easily.  Psychiatric/Behavioral:  Negative for agitation and sleep disturbance. The patient is not nervous/anxious.     Immunization History  Administered Date(s) Administered   Adenovirus 08/15/2011   HPV 9-valent 04/20/2014   Hep A / Hep B 08/15/2011   IPV 08/15/2011   Influenza Nasal 08/15/2011   Meningococcal Conjugate 08/15/2011   Tdap 08/15/2011, 05/16/2016, 01/23/2022   Pertinent  Health Maintenance Due  Topic Date Due   INFLUENZA VACCINE  06/25/2022   PAP SMEAR-Modifier  07/04/2023      03/10/2022    8:24 PM 03/11/2022    9:00 AM 07/17/2022    1:48 PM 09/17/2022   10:40 AM 10/01/2022    9:29 AM  Fall Risk  Falls in the past year?    0 0  Was there an injury with Fall?    0 0  Fall Risk Category Calculator    0 0  Fall Risk Category    Low Low  Patient Fall Risk Level _0   Patient at Risk for Falls Due to    No Fall Risks No Fall Risks  Fall risk Follow up    Falls evaluation completed Falls evaluation completed   Functional Status Survey:    Vitals:   10/01/22 0921  BP: 126/88  Pulse: 80  Resp: 18  Temp: (!) 97.1 F (36.2 C)  SpO2: 98%  Weight: 252 lb 9.6 oz (114.6 kg)  Height: _1  (1.626 m)   Body mass index is 43.36 kg/m. Physical Exam Vitals reviewed.  Constitutional:      General: She is not in acute distress.    Appearance: Normal appearance. She is obese. She is not ill-appearing or diaphoretic.  HENT:     Head: Normocephalic.  Eyes:     General: No scleral icterus.       Right eye: No discharge.        Left eye: No discharge.     Conjunctiva/sclera: Conjunctivae  normal.     Pupils: Pupils are equal, round, and reactive to light.  Neck:     Vascular: No carotid bruit.  Cardiovascular:     Rate and Rhythm: Normal rate and regular rhythm.     Pulses: Normal pulses.     Heart sounds: Normal heart sounds. No murmur heard.    No friction rub. No gallop.  Pulmonary:     Effort: Pulmonary effort is normal. No respiratory distress.     Breath sounds: Normal breath sounds. No wheezing, rhonchi or rales.  Chest:     Chest wall: No tenderness.  Abdominal:     General: Bowel sounds are normal. There is no distension.     Palpations: Abdomen is soft. There is no mass.     Tenderness: There is no abdominal tenderness. There is no right CVA tenderness, left CVA tenderness, guarding or rebound.  Musculoskeletal:        General: No swelling or tenderness. Normal range of motion.  Cervical back: Normal range of motion. No rigidity or tenderness.     Right lower leg: No edema.     Left lower leg: No edema.  Lymphadenopathy:     Cervical: No cervical adenopathy.  Skin:    General: Skin is warm and dry.     Coloration: Skin is not pale.     Findings: No bruising, erythema, lesion or rash.  Neurological:     Mental Status: She is alert and oriented to person, place, and time.     Motor: No weakness.     Gait: Gait normal.  Psychiatric:        Mood and Affect: Mood normal.        Speech: Speech normal.        Behavior: Behavior normal.     Labs reviewed: Recent Labs    03/07/22 1817 03/09/22 0534 03/14/22 0054 09/17/22 1149  NA 136  --  140 140  K 3.6  --  3.3* 4.1  CL 107  --  109 104  CO2 22  --  23 29  GLUCOSE 77  --  97 86  BUN 5*  --  10 8  CREATININE 0.60 0.74 0.74 0.79  CALCIUM 8.9  --  8.8* 9.2   Recent Labs    03/02/22 1924 03/07/22 1817 03/14/22 0054 09/17/22 1149  AST 17 14* 20 12  ALT _0 ALKPHOS 110 115 92  --   BILITOT 0.4 0.5 0.4 0.5  PROT 6.1* 6.3* 5.7* 6.9  ALBUMIN 2.5* 2.5* 2.4*  --    Recent Labs     02/20/22 0937 03/02/22 1924 03/09/22 0534 03/14/22 0054 09/17/22 1149  WBC 9.1   < > 13.2* 6.3 4.6  NEUTROABS 6.7  --   --   --  2,006  HGB 9.9*   < > 9.3* 9.5* 11.7  HCT 29.2*   < > 27.4* 29.5* 35.9  MCV 93.0   < > 93.2 97.4 88.2  PLT 193   < > 197 260 274   < > = values in this interval not displayed.   Lab Results  Component Value Date   TSH 1.92 09/17/2022   No results found for: "HGBA1C" Lab Results  Component Value Date   CHOL 261 (H) 09/17/2022   HDL 52 09/17/2022   LDLCALC 181 (H) 09/17/2022   TRIG 138 09/17/2022   CHOLHDL 5.0 (H) 09/17/2022    Significant Diagnostic Results in last 30 days:  No results found.  Assessment/Plan 1. Primary hypertension Home B/p log reviewed readings stable. Will continue with dietary modification and exercise for now  - Advised to check Blood pressure at home and record on log provided and notify provider if B/p > 140/90 then will start on Amlodipine  - TSH; Future - COMPLETE METABOLIC PANEL WITH GFR; Future - CBC with Differential/Platelet; Future  2. Hyperlipidemia LDL goal <100 Previous LDL elevated  -Recommend a low saturated fats, low carbohydrates and high vegetable diet also exercise at least 3 times per week for 30 minutes. - Lipid panel; Future  Family/ staff Communication: Reviewed plan of care with patient verbalized understanding   Labs/tests ordered:  - CBC with Differential/Platelet - CMP with eGFR([facility]) - TSH - Lipid panel Next Appointment: Return in about 6 months (around 04/01/2023) for Fasting labs in 6 months prior to visit, medical mangement of chronic issues.Sandrea Hughs, NP

## 2022-12-04 IMAGING — US US OB COMP LESS 14 WK
1 series · 15 of 28 positions shown · non-contrast
Comparison: 08/09/2021

CLINICAL DATA: First trimester pregnancy of unknown anatomic
location, assess viability; LMP 06/08/2021

EXAM:
OBSTETRIC <14 WK ULTRASOUND
TECHNIQUE: Transabdominal ultrasound was performed for evaluation of the
gestation as well as the maternal uterus and adnexal regions.

[Series 1: us ob comp less 14 wk · 50 acquisitions, 15 frames shown]
[im 1/50]
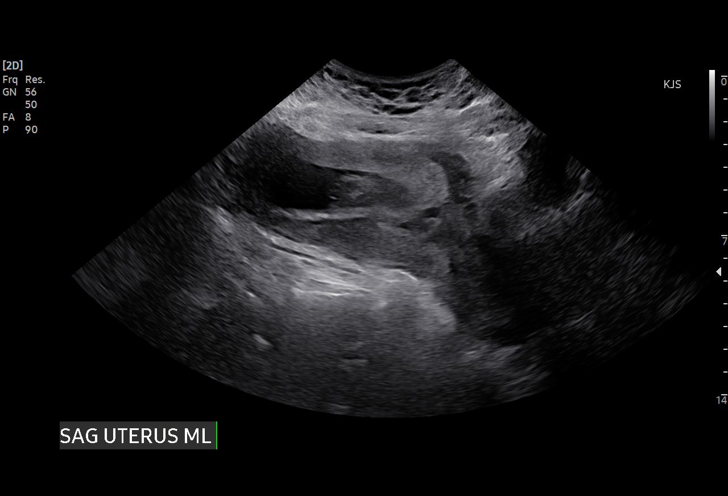
[im 4/50]
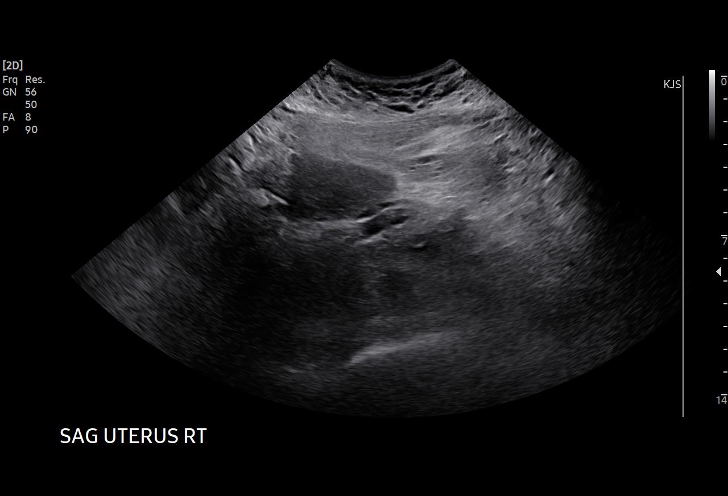
[im 8/50]
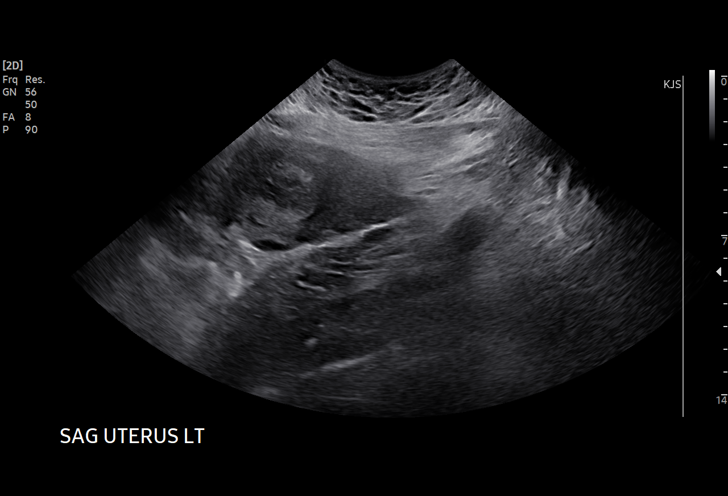
[im 11/50]
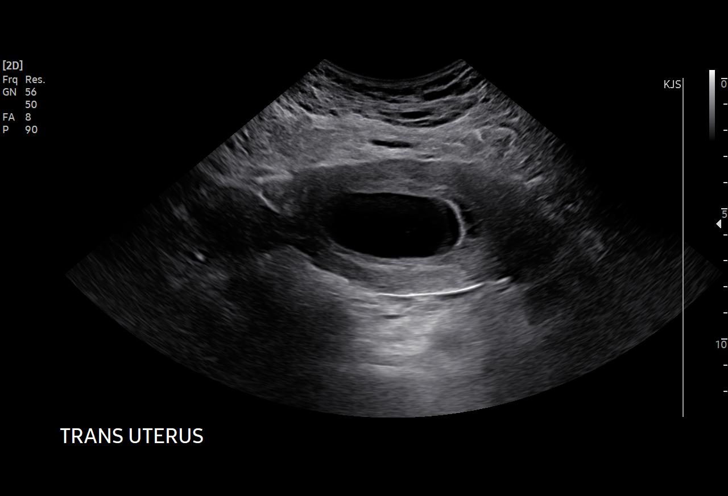
[im 15/50]
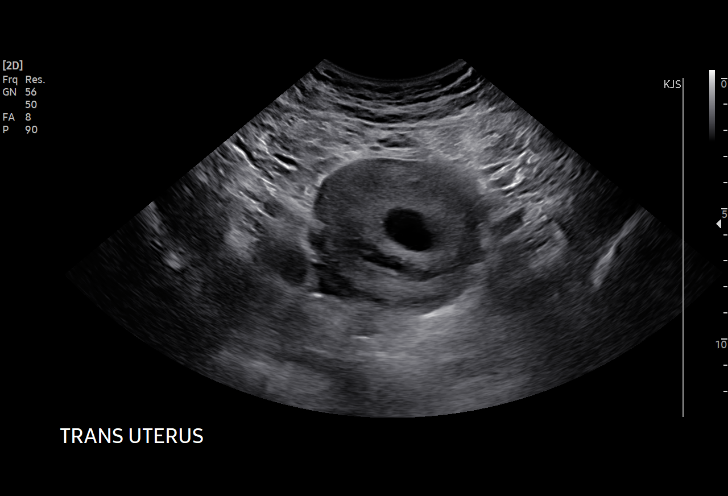
[im 19/50]
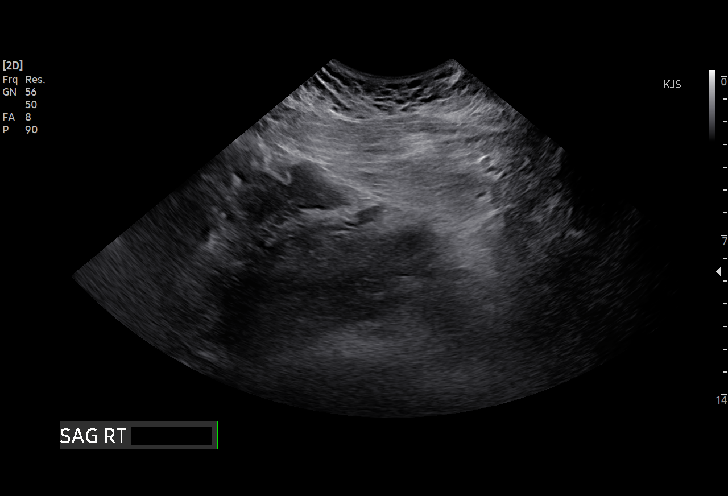
[im 22/50]
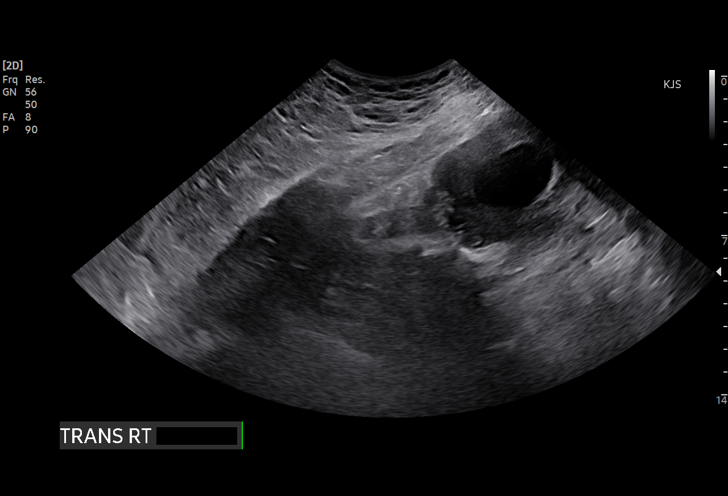
[im 26/50]
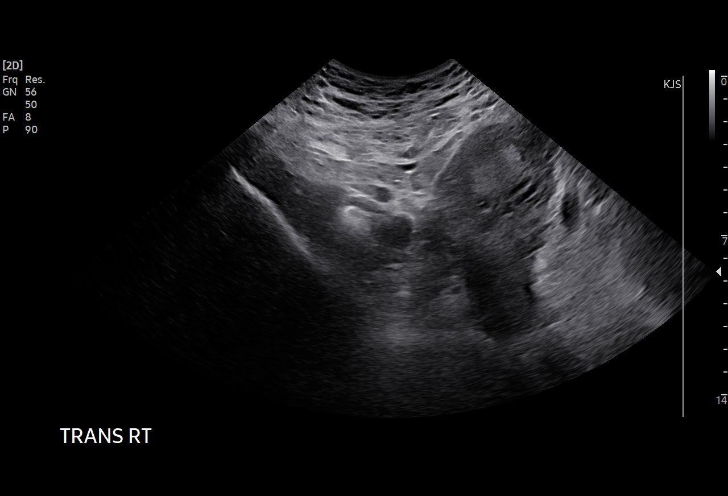
[im 28/50]
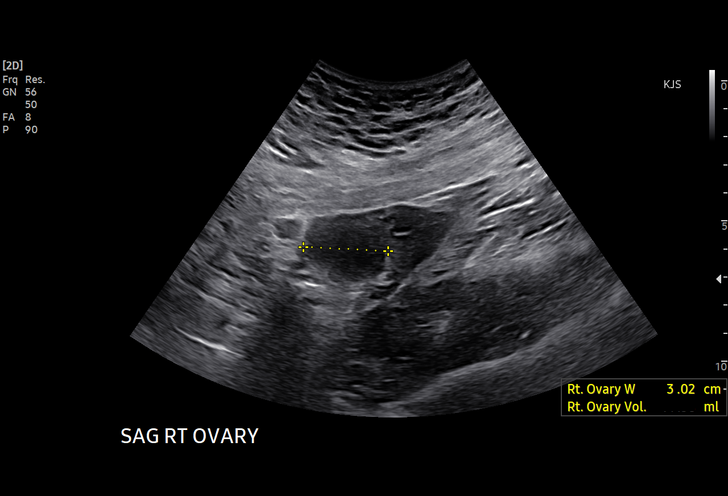
[im 31/50]
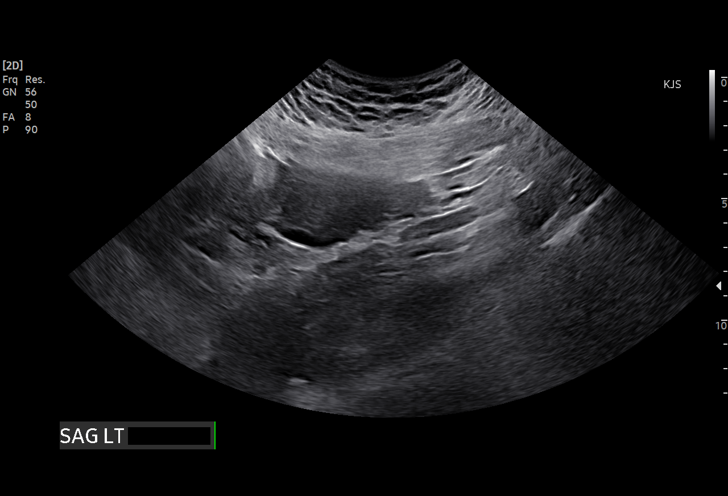
[im 35/50]
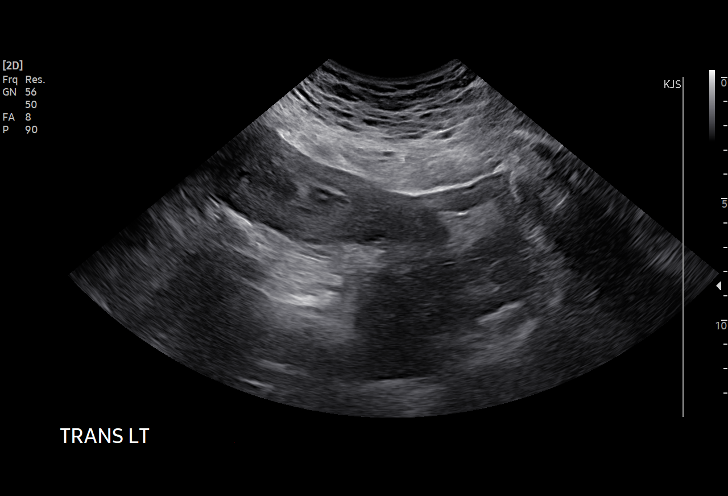
[im 39/50]
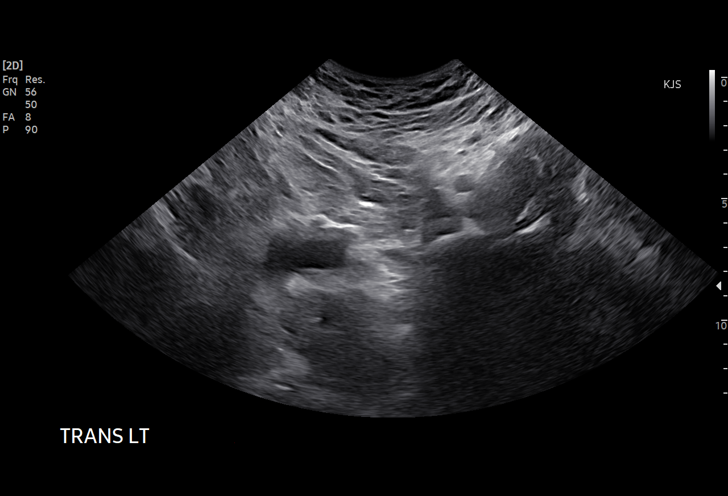
[im 42/50]
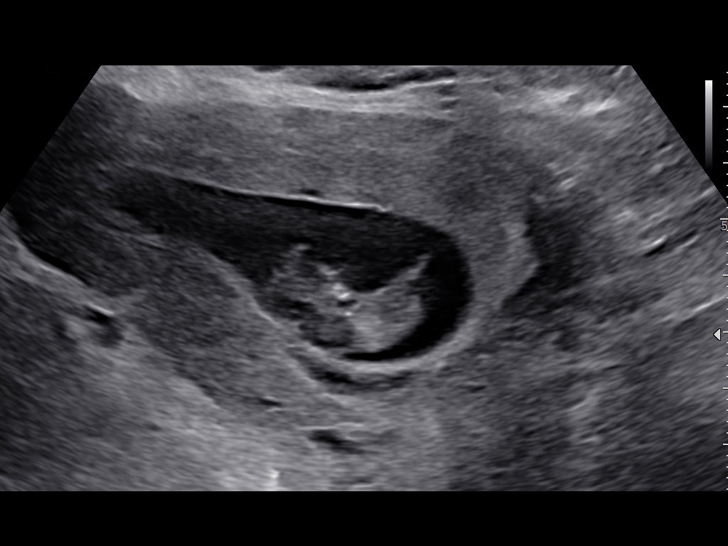
[im 46/50]
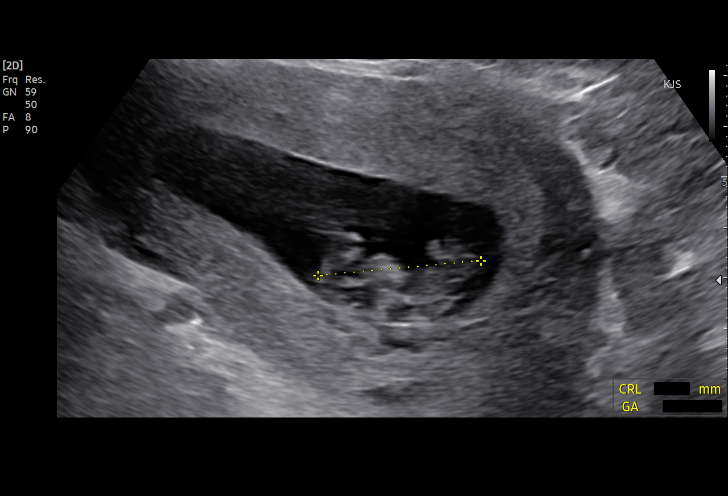
[im 50/50]
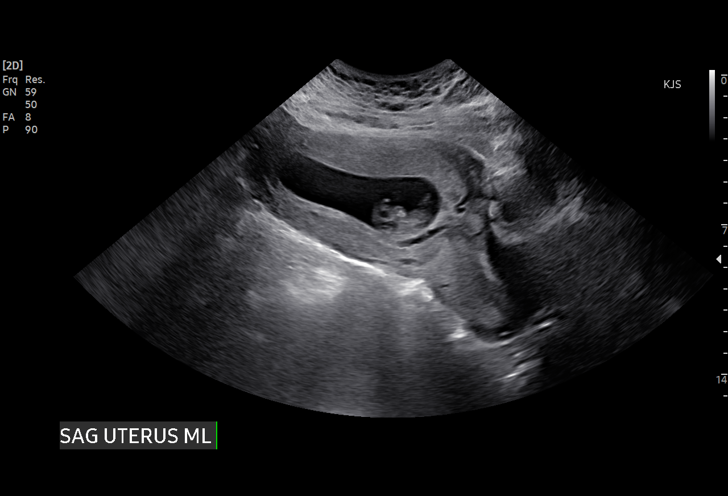

[15 of 28 positions shown; findings below may reference images not displayed]

FINDINGS: Intrauterine gestational sac: Present, single

Yolk sac:  Present

Embryo:  Present

Cardiac Activity: Present

Heart Rate: 176 bpm

CRL:   32.6 mm   10 w 1 d                  US EDC: 04/03/2022

Subchorionic hemorrhage:  None visualized.

Maternal uterus/adnexae:

Uterus otherwise normal morphology.

Ovaries unremarkable.

No free pelvic fluid or adnexal masses.
IMPRESSION: Single live intrauterine gestation at 10 weeks 1 day EGA by
crown-rump length.

No acute abnormalities.

## 2023-02-25 ENCOUNTER — Encounter: Payer: Self-pay | Admitting: Family

## 2023-02-25 ENCOUNTER — Ambulatory Visit (INDEPENDENT_AMBULATORY_CARE_PROVIDER_SITE_OTHER): Payer: Medicaid Other | Admitting: Family

## 2023-02-25 VITALS — BP 120/88 | HR 82 | Temp 97.3°F | Resp 17 | Ht 64.0 in | Wt 248.0 lb

## 2023-02-25 DIAGNOSIS — K219 Gastro-esophageal reflux disease without esophagitis: Secondary | ICD-10-CM

## 2023-02-25 DIAGNOSIS — I10 Essential (primary) hypertension: Secondary | ICD-10-CM | POA: Diagnosis not present

## 2023-02-25 DIAGNOSIS — R002 Palpitations: Secondary | ICD-10-CM | POA: Diagnosis not present

## 2023-02-25 MED ORDER — OMEPRAZOLE 20 MG PO CPDR: 20.0000 mg | DELAYED_RELEASE_CAPSULE | Freq: Every day | ORAL | 3 refills | Status: DC

## 2023-02-25 NOTE — Progress Notes (Signed)
Provider: Marlowe Sax FNP-C  Zachary Nole, Nelda Bucks, NP  Patient Care Team: Azaria Bartell, Nelda Bucks, NP as PCP - General (Family Medicine)  Extended Emergency Contact Information Primary Emergency Contact: Koch,Maureen Address: [phone] [place] [address] [place] Phone: [phone] Relation: Mother Secondary Emergency Contact: Aguino,Charnelle Mobile Phone: [phone] Relation: Sister Preferred language: English Interpreter needed? No  Code Status:  Full Code  Goals of care: Advanced Directive information    02/25/2023    9:52 AM  Advanced Directives  Does Patient Have a Medical Advance Directive? No  Would patient like information on creating a medical advance directive? No - Patient declined     Chief Complaint  Patient presents with   Acute Visit    Patient complains of heart flutters and acid reflux for a couple of days.    HPI:  Pt is a 32 y.o. female seen today for an acute visit for evaluation of heart flutters and acid reflux.she is here today with her one year old daughter Jewel. States flutters are intermittent.Flutters described like being on a roller coaster.recently occurred while in the car.no chest pain or shortness of breath.Has been worried that there's increase in cancer and other conditions now days and just wanted to get checked out too. She denies any issues with anxiety or depression.   she brought in her home B/p log readings in the 110's/80  - 130's/80's with some readings in the 140's/80's.HR runs in the 70's -80's with some occasional readings in the 90's - 100's. Also denies any recent acute illness.     Past Medical History:  Diagnosis Date   Chlamydia 2011   History of Papanicolaou smear of cervix    Hypertension    Gestatoinal   Low iron    Medical history non-contributory    Sciatica    Past Surgical History:  Procedure Laterality Date   CESAREAN SECTION N/A 05/28/2016   Procedure: CESAREAN  SECTION;  Surgeon: Delsa Bern, MD;  Location: [facility];  Service: Obstetrics;  Laterality: N/A;   CESAREAN SECTION WITH BILATERAL TUBAL LIGATION Bilateral 03/08/2022   Procedure: CESAREAN SECTION WITH BILATERAL TUBAL LIGATION;  Surgeon: Janyth Pupa, DO;  Location: [facility];  Service: Obstetrics;  Laterality: Bilateral;   WISDOM TOOTH EXTRACTION      No Known Allergies  Outpatient Encounter Medications as of 02/25/2023  Medication Sig   albuterol (PROVENTIL) (2.5 MG/3ML) 0.083% nebulizer solution Take 2.5 mg by nebulization every 6 (six) hours as needed for wheezing or shortness of breath.   fluticasone (FLONASE) 50 MCG/ACT nasal spray Place 2 sprays into both nostrils daily.   [DISCONTINUED] levonorgestrel (MIRENA) 20 MCG/24HR IUD 1 each by Intrauterine route once.   No facility-administered encounter medications on file as of 02/25/2023.    Review of Systems  Constitutional:  Negative for appetite change, chills, fatigue, fever and unexpected weight change.  HENT:  Negative for congestion, dental problem, ear discharge, ear pain, hearing loss, nosebleeds, postnasal drip, rhinorrhea, sinus pressure, sinus pain, sneezing and sore throat.   Eyes:  Negative for pain, discharge, redness, itching and visual disturbance.  Respiratory:  Negative for cough, chest tightness, shortness of breath and wheezing.   Cardiovascular:  Positive for palpitations. Negative for chest pain and leg swelling.  Gastrointestinal:  Negative for abdominal distention, abdominal pain, blood in stool, constipation, diarrhea, nausea and vomiting.  Endocrine: Negative for cold intolerance, heat intolerance, polydipsia, polyphagia and  polyuria.  Genitourinary:  Negative for difficulty urinating, dysuria, flank pain, frequency and urgency.  Musculoskeletal:  Negative for arthralgias, back pain, gait problem, joint swelling, myalgias, neck pain and neck stiffness.  Skin:  Negative for color change, pallor, rash  and wound.  Neurological:  Negative for dizziness, syncope, speech difficulty, weakness, light-headedness, numbness and headaches.  Hematological:  Does not bruise/bleed easily.  Psychiatric/Behavioral:  Negative for agitation, behavioral problems, confusion, hallucinations, self-injury, sleep disturbance and suicidal ideas. The patient is not nervous/anxious.     Immunization History  Administered Date(s) Administered   Adenovirus 08/15/2011   HPV 9-valent 04/20/2014   Hep A / Hep B 08/15/2011   IPV 08/15/2011   Influenza Nasal 08/15/2011   Meningococcal Conjugate 08/15/2011   Tdap 08/15/2011, 05/16/2016, 01/23/2022   Pertinent  Health Maintenance Due  Topic Date Due   INFLUENZA VACCINE  06/26/2023   PAP SMEAR-Modifier  07/04/2023      03/11/2022    9:00 AM 07/17/2022    1:48 PM 09/17/2022   10:40 AM 10/01/2022    9:29 AM 02/25/2023    9:52 AM  Fall Risk  Falls in the past year?   0 0 0  Was there an injury with Fall?   0 0 0  Fall Risk Category Calculator   0 0 0  Fall Risk Category (Retired)   Low Low   (RETIRED) Patient Fall Risk Level Low fall risk Low fall risk Low fall risk Low fall risk   Patient at Risk for Falls Due to   No Fall Risks No Fall Risks No Fall Risks  Fall risk Follow up   Falls evaluation completed Falls evaluation completed Falls evaluation completed   Functional Status Survey:    Vitals:   02/25/23 0951  BP: 120/88  Pulse: 82  Resp: 17  Temp: (!) 97.3 F (36.3 C)  SpO2: 95%  Weight: 248 lb (112.5 kg)  Height: 5\' 4"  (1.626 m)   Body mass index is 42.57 kg/m. Physical Exam Vitals reviewed.  Constitutional:      General: She is not in acute distress.    Appearance: Normal appearance. She is obese. She is not ill-appearing or diaphoretic.  HENT:     Head: Normocephalic.  Eyes:     General: No scleral icterus.       Right eye: No discharge.        Left eye: No discharge.     Conjunctiva/sclera: Conjunctivae normal.     Pupils: Pupils are  equal, round, and reactive to light.  Neck:     Vascular: No carotid bruit.  Cardiovascular:     Rate and Rhythm: Normal rate and regular rhythm.     Pulses: Normal pulses.     Heart sounds: Normal heart sounds. No murmur heard.    No friction rub. No gallop.  Pulmonary:     Effort: Pulmonary effort is normal. No respiratory distress.     Breath sounds: Normal breath sounds. No wheezing, rhonchi or rales.  Chest:     Chest wall: No tenderness.  Abdominal:     General: Bowel sounds are normal. There is no distension.     Palpations: Abdomen is soft. There is no mass.     Tenderness: There is no abdominal tenderness. There is no right CVA tenderness, left CVA tenderness, guarding or rebound.  Musculoskeletal:        General: No swelling or tenderness. Normal range of motion.     Cervical back: Normal range of  motion. No rigidity or tenderness.     Right lower leg: No edema.     Left lower leg: No edema.  Lymphadenopathy:     Cervical: No cervical adenopathy.  Skin:    General: Skin is warm and dry.     Coloration: Skin is not pale.     Findings: No erythema or rash.  Neurological:     Mental Status: She is alert and oriented to person, place, and time.     Cranial Nerves: No cranial nerve deficit.     Sensory: No sensory deficit.     Motor: No weakness.     Coordination: Coordination normal.     Gait: Gait normal.  Psychiatric:        Mood and Affect: Mood normal.        Speech: Speech normal.        Behavior: Behavior normal.     Labs reviewed: Recent Labs    03/07/22 1817 03/09/22 0534 03/14/22 0054 09/17/22 1149  NA 136  --  140 140  K 3.6  --  3.3* 4.1  CL 107  --  109 104  CO2 22  --  23 29  GLUCOSE 77  --  97 86  BUN 5*  --  10 8  CREATININE 0.60 0.74 0.74 0.79  CALCIUM 8.9  --  8.8* 9.2   Recent Labs    03/02/22 1924 03/07/22 1817 03/14/22 0054 09/17/22 1149  AST 17 14* 20 12  ALT 15 14 28 9   ALKPHOS 110 115 92  --   BILITOT 0.4 0.5 0.4 0.5   PROT 6.1* 6.3* 5.7* 6.9  ALBUMIN 2.5* 2.5* 2.4*  --    Recent Labs    03/09/22 0534 03/14/22 0054 09/17/22 1149  WBC 13.2* 6.3 4.6  NEUTROABS  --   --  2,006  HGB 9.3* 9.5* 11.7  HCT 27.4* 29.5* 35.9  MCV 93.2 97.4 88.2  PLT 197 260 274   Lab Results  Component Value Date   TSH 1.92 09/17/2022   No results found for: "HGBA1C" Lab Results  Component Value Date   CHOL 261 (H) 09/17/2022   HDL 52 09/17/2022   LDLCALC 181 (H) 09/17/2022   TRIG 138 09/17/2022   CHOLHDL 5.0 (H) 09/17/2022    Significant Diagnostic Results in last 30 days:  No results found.  Assessment/Plan  1. Primary hypertension Average home B/p readings are well controlled.  Reading today within normal range. - continue to monitor - advised to check Blood pressure at home and record on log provided and notify provider if B/p > 140/90   2. Palpitation Reports intermittent flutters.few home HR readings in the 90's -100's - EKG 12-Lead indicates Normal sinus rhythm with HR 81 b/min. - suspect possible anxiety ? Panic attack.will start on anxiety if symptoms persist or worsen.   3. Gastroesophageal reflux disease without esophagitis - advised to avoid eating meals late in the evening and to avoid aggravating foods and spices. - start on Omeprazole  - omeprazole (PRILOSEC) 20 MG capsule; Take 1 capsule (20 mg total) by mouth daily.  Dispense: 30 capsule; Refill: 3  Family/ staff Communication: Reviewed plan of care with patient verbalized understanding.   Labs/tests ordered: None   Next Appointment: Return if symptoms worsen or fail to improve.   Sandrea Hughs, NP

## 2023-02-26 ENCOUNTER — Other Ambulatory Visit: Payer: Self-pay | Admitting: Family

## 2023-02-26 ENCOUNTER — Ambulatory Visit: Payer: Medicaid Other | Admitting: Family

## 2023-02-26 DIAGNOSIS — R002 Palpitations: Secondary | ICD-10-CM

## 2023-02-26 NOTE — Progress Notes (Signed)
Cardiology referral ordered as requested

## 2023-03-13 ENCOUNTER — Encounter: Payer: Self-pay | Admitting: Internal Medicine

## 2023-03-25 IMAGING — US US MFM OB DETAIL+14 WK
1 series · 13 of 28 positions shown · non-contrast
Comparison: none

[Series 1: us mfm ob detail+14 wk · 74 acquisitions, 13 frames shown]
[im 3/74]
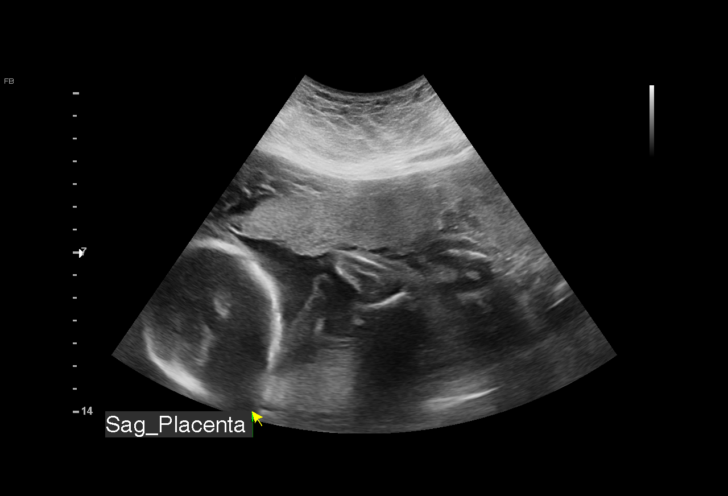
[im 9/74]
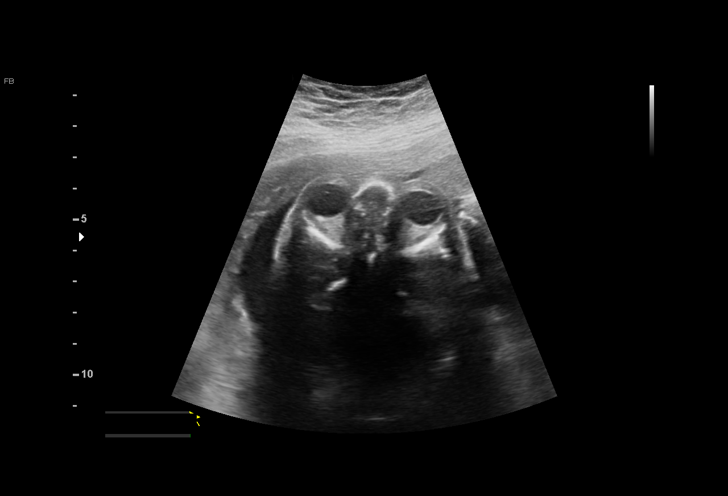
[im 14/74]
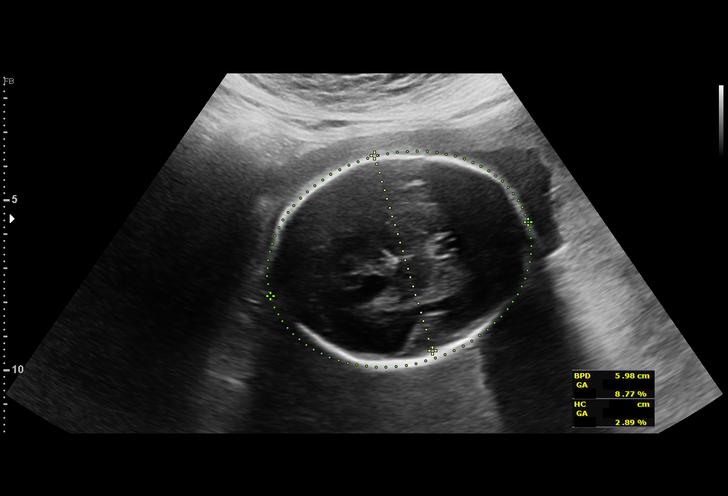
[im 19/74]
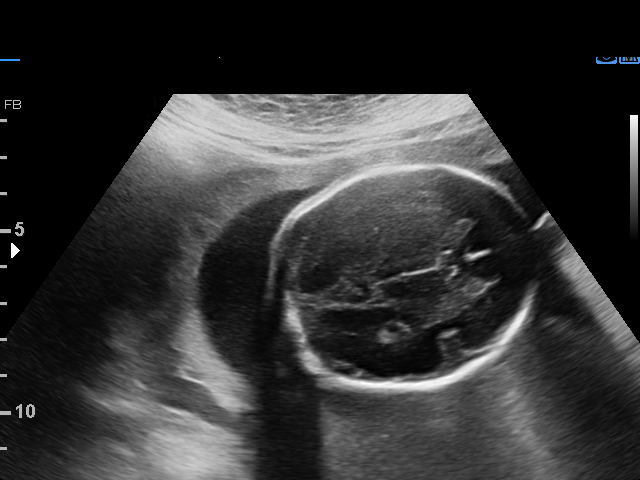
[im 25/74]
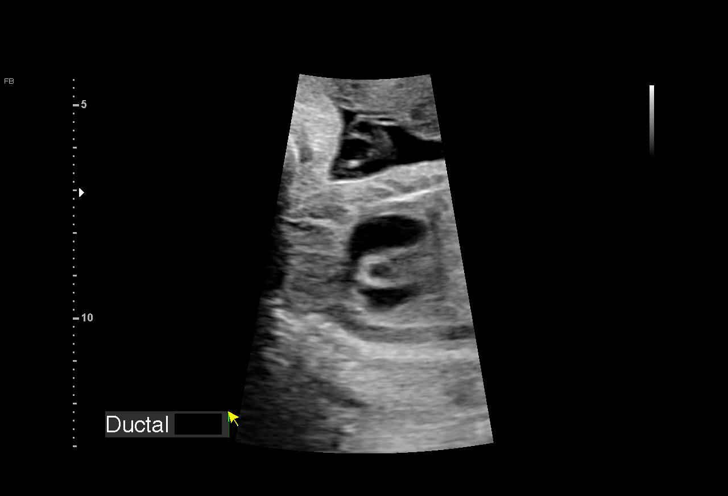
[im 30/74]
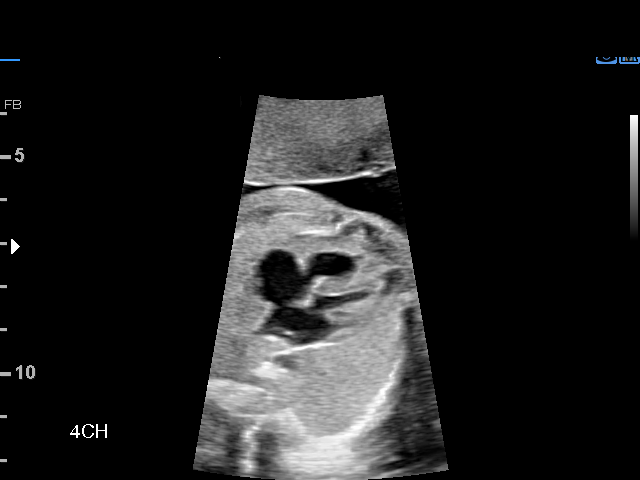
[im 38/74]
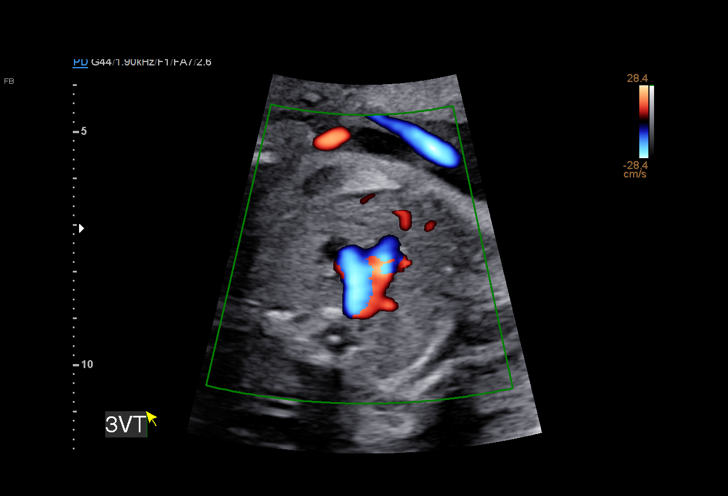
[im 44/74]
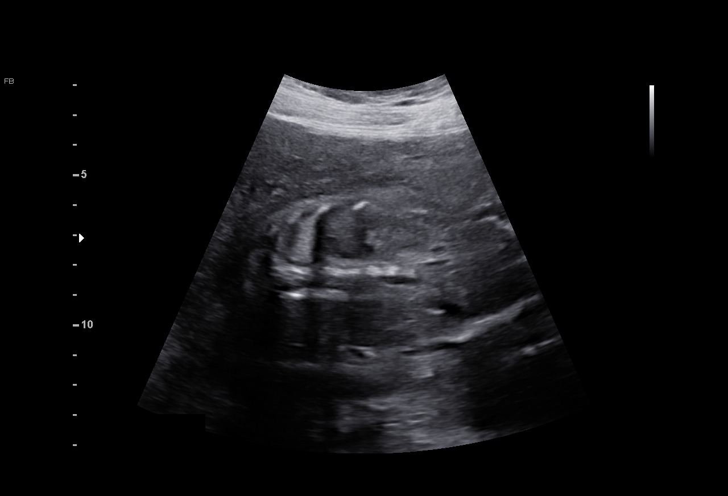
[im 49/74]
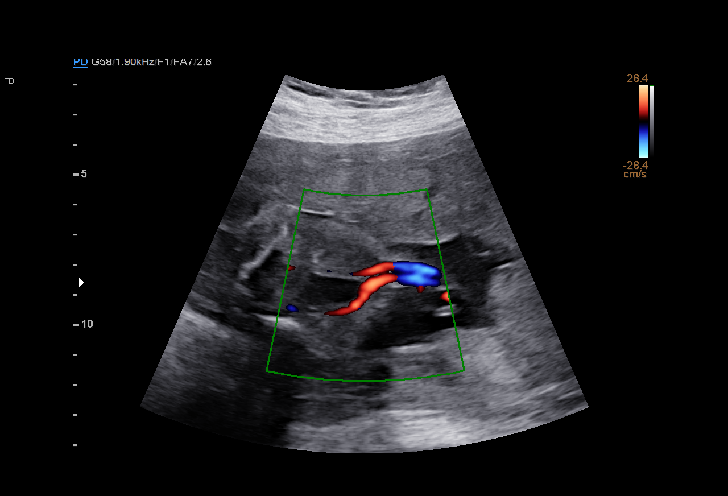
[im 55/74]
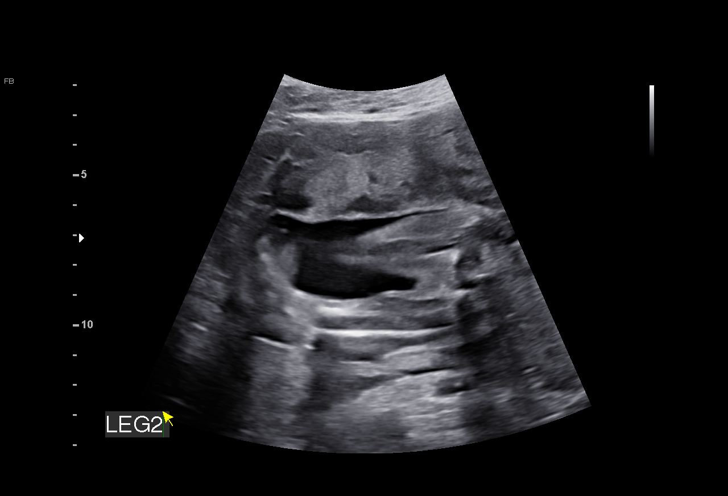
[im 60/74]
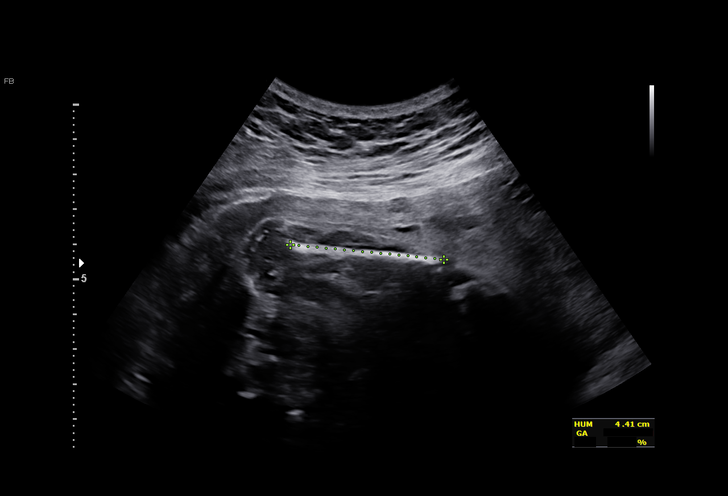
[im 65/74]
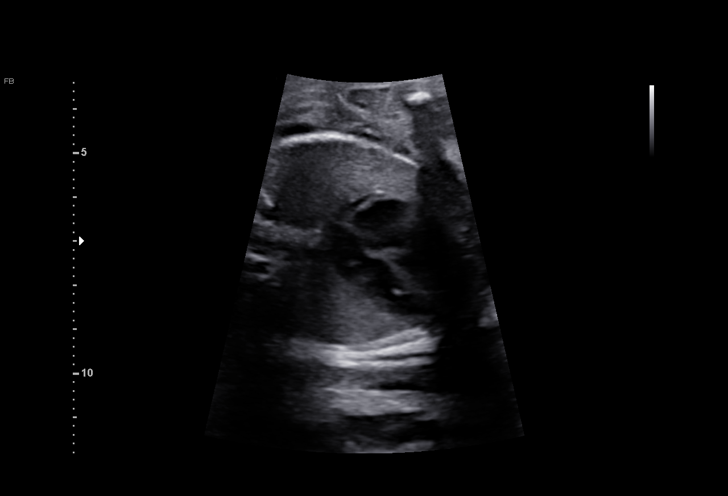
[im 71/74]
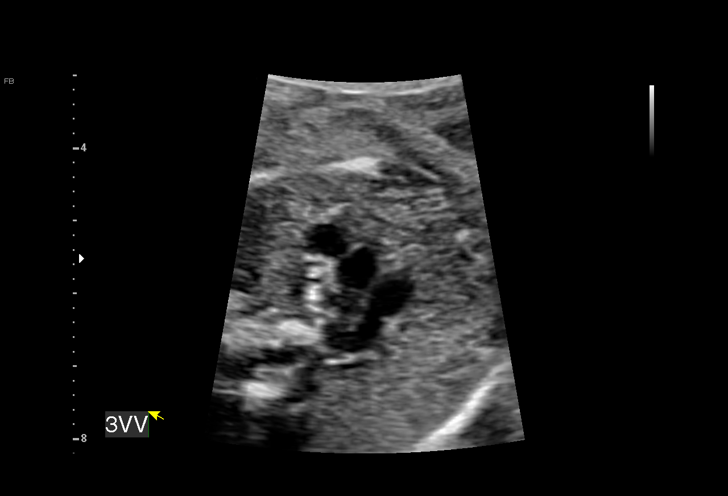

[13 of 28 positions shown; findings below may reference images not displayed]

Indications

 Obesity complicating pregnancy, second
 trimester- BMI 44
 Echogenic intracardiac focus of the heart
 (EIF)
 Previous cesarean delivery, antepartum
 Poor obstetrical history - postpartum
 respiratory failure
 Genetic carrier - SMA carrier- partner tested
 negative
 Encounter for antenatal screening for
 malformations
 25 weeks gestation of pregnancy
 LR NIPS
Fetal Evaluation

 Num Of Fetuses:         1
 Fetal Heart Rate(bpm):  157
 Cardiac Activity:       Observed
 Presentation:           Breech
 Placenta:               Anterior
 P. Cord Insertion:      Visualized, central

 Amniotic Fluid
 AFI FV:      Within normal limits

                             Largest Pocket(cm)

Biometry

 BPD:        60  mm     G. Age:  24w 3d         10  %    CI:        72.24   %    70 - 86
                                                         FL/HC:      21.2   %    18.6 -
 HC:      224.6  mm     G. Age:  24w 3d        4.5  %    HC/AC:      1.03        1.04 -
 AC:      218.2  mm     G. Age:  26w 2d         64  %    FL/BPD:     79.5   %    71 - 87
 FL:       47.7  mm     G. Age:  26w 0d         48  %    FL/AC:      21.9   %    20 - 24
 HUM:      44.7  mm     G. Age:  26w 4d         68  %
 CER:      28.7  mm     G. Age:  25w 2d         42  %
 LV:        2.6  mm
 CM:        3.4  mm

 Est. FW:     864  gm    1 lb 14 oz      53  %
OB History

 Gravidity:    3         Term:   1         SAB:   1
 Living:       1
Gestational Age

 LMP:           28w 5d        Date:  06/08/21                 EDD:   03/15/22
 U/S Today:     25w 2d                                        EDD:   04/08/22
 Best:          25w 4d     Det. By:  Early Ultrasound         EDD:   04/06/22
                                     (08/09/21)
Anatomy

 Cranium:               Appears normal         Aortic Arch:            Appears normal
 Cavum:                 Appears normal         Ductal Arch:            Appears normal
 Ventricles:            Appears normal         Diaphragm:              Appears normal
 Choroid Plexus:        Appears normal         Stomach:                Appears normal, left
                                                                       sided
 Cerebellum:            Appears normal         Abdomen:                Appears normal
 Posterior Fossa:       Appears normal         Abdominal Wall:         Appears nml (cord
                                                                       insert, abd wall)
 Nuchal Fold:           Not applicable (>20    Cord Vessels:           Appears normal (3
                        wks GA)                                        vessel cord)
 Face:                  Appears normal         Kidneys:                Appear normal
                        (orbits and profile)
 Lips:                  Appears normal         Bladder:                Appears normal
 Thoracic:              Appears normal         Spine:                  Ltd views no
                                                                       intracranial signs of
                                                                       NTD
 Heart:                 Appears normal         Upper Extremities:      Visualized
                        (4CH, axis, and
                        situs)
 RVOT:                  Appears normal         Lower Extremities:      Visualized
 LVOT:                  Appears normal

 Other:  Fetus appears to be female. VC, 3VV and 3VTV visualized.
Cervix Uterus Adnexa

 Cervix
 Length:           4.09  cm.
 Normal appearance by transabdominal scan.
 Right Ovary
 Visualized.

 Left Ovary
 Visualized.
Impression

 G3 P1.  Patient is here for fetal anatomy scan.
 On cell-free fetal DNA screening, the risks of fetal
 aneuploidies are not increased .
 Obstetrical history significant for a term cesarean delivery.
 Patient was readmitted in the postpartum period for
 complaints of shortness of breath.  Bilateral pleural effusions
 (and respiratory failure) were noted.  Patient recovered well
 after treatment.

 We performed fetal anatomy scan. An echogenic intracardiac
 focus is seen. No other makers of aneuploidies or fetal
 structural defects are seen. Fetal biometry is consistent with
 her previously-established dates. Amniotic fluid is normal and
 good fetal activity is seen.  Placenta is anterior and there is
 no evidence of previa or placenta accreta spectrum.
 As maternal obesity limits resolution of images, failure to
 detect anomalies are more common .
 I informed the patient that given that she had [facility] for fetal
 aneuploidies on cell-free fetal DNA screening, finding of
 echogenic intracardiac focus should be considered a normal
 variant and that the risk of trisomy 21 is not increased. I also
 reassured that echogenic focus does not increase the risk of
 cardiac defects. I also informed her that only amniocentesis
 will give a defintive result on the fetal karyotype.
 Patient opted not to have amniocentesis.
Recommendations

 -An appointment was made for her to return in 5 weeks for
 fetal growth assessment.
 -Weekly BPP from 34 weeks gestation till delivery.
                 Deny, Ibenk

## 2023-03-26 NOTE — Progress Notes (Unsigned)
Cardiology Office Note:    Date:  03/27/2023   ID:  Paula [name], DOB [birth year], MRN [id number]  PCP:  Caesar Bookman, NP   [facility] Providers Cardiologist:  None     Referring MD: Caesar Bookman, NP   No chief complaint on file. Palpitations  History of Present Illness:    Paula [name] is a 32 y.o. female referral for palpitations. She noted heart fluttering. This was Easter Sunday, just that day. It felt like going down a roller coaster per patient. This did not last. No syncope. No racing heart rate. No family hx of SCD. Grandmother had CHF No smoking She has a 34 year old and 49 year old. Her baby had retinoblastoma, had eye removed, now with prosthesis [facility]en by [facility] when she was [redacted] weeks pregnant 03/01/2022. She was having CP and SOB. She had a normal echo. Prior echo in 2017 was normal. At that time she had ARF following this pregnancy.  Past Medical History:  Diagnosis Date   Chlamydia 2011   History of Papanicolaou smear of cervix    Hypertension    Gestatoinal   Low iron    Medical history non-contributory    Sciatica     Past Surgical History:  Procedure Laterality Date   CESAREAN SECTION N/A 05/28/2016   Procedure: CESAREAN SECTION;  Surgeon: Silverio Lay, MD;  Location: [facility];  Service: Obstetrics;  Laterality: N/A;   CESAREAN SECTION WITH BILATERAL TUBAL LIGATION Bilateral 03/08/2022   Procedure: CESAREAN SECTION WITH BILATERAL TUBAL LIGATION;  Surgeon: Myna Hidalgo, DO;  Location: [facility];  Service: Obstetrics;  Laterality: Bilateral;   WISDOM TOOTH EXTRACTION      Current Medications: Current Outpatient Medications on File Prior to Visit  Medication Sig Dispense Refill   albuterol (PROVENTIL) (2.5 MG/3ML) 0.083% nebulizer solution Take 2.5 mg by nebulization every 6 (six) hours as needed for wheezing or shortness of breath.     ferrous sulfate 325 (65 FE) MG tablet Take 325 mg by mouth daily  with breakfast.     fluticasone (FLONASE) 50 MCG/ACT nasal spray Place 2 sprays into both nostrils daily. 16 g 6   Vitamin D, Ergocalciferol, (DRISDOL) 1.25 MG (50000 UNIT) CAPS capsule Take 50,000 Units by mouth every 7 (seven) days.     [DISCONTINUED] levonorgestrel (MIRENA) 20 MCG/24HR IUD 1 each by Intrauterine route once.     No current facility-administered medications on file prior to visit.     Allergies:   Patient has no known allergies.   Social History   Socioeconomic History   Marital status: Married    Spouse name: Not on file   Number of children: Not on file   Years of education: Not on file   Highest education level: Some college, no degree  Occupational History   Not on file  Tobacco Use   Smoking status: Never   Smokeless tobacco: Never  Vaping Use   Vaping Use: Never used  Substance and Sexual Activity   Alcohol use: Never   Drug use: No   Sexual activity: Not Currently  Other Topics Concern   Not on file  Social History Narrative   Not on file   Social Determinants of Health   Financial Resource Strain: Low Risk  (02/24/2023)   Overall Financial Resource Strain (CARDIA)    Difficulty of Paying Living Expenses: Not hard at all  Food Insecurity: No Food Insecurity (02/24/2023)   Hunger Vital Sign  Worried About Programme researcher, broadcasting/film/video in the Last Year: Never true    Ran Out of Food in the Last Year: Never true  Transportation Needs: No Transportation Needs (02/24/2023)   PRAPARE - Administrator, Civil Service (Medical): No    Lack of Transportation (Non-Medical): No  Physical Activity: Insufficiently Active (02/24/2023)   Exercise Vital Sign    Days of Exercise per Week: 1 day    Minutes of Exercise per Session: 30 min  Stress: No Stress Concern Present (02/24/2023)   [employer] of Occupational Health - Occupational Stress Questionnaire    Feeling of Stress : Not at all  Social Connections: Moderately Integrated (02/24/2023)   Social  Connection and Isolation Panel [NHANES]    Frequency of Communication with Friends and Family: More than three times a week    Frequency of Social Gatherings with Friends and Family: More than three times a week    Attends Religious Services: More than 4 times per year    Active Member of [employer] or Organizations: No    Attends Engineer, structural: Not on file    Marital Status: Married     Family History: The patient's family history includes Diabetes in her maternal grandmother; Healthy in her father; Heart disease in her maternal grandmother; Hypertension in her brother and maternal grandmother; Leukemia in her mother.  ROS:   Please see the history of present illness.     All other systems reviewed and are negative.  EKGs/Labs/Other Studies Reviewed:    The following studies were reviewed today:   EKG:  EKG is  ordered today.  The ekg ordered today demonstrates   03/27/2023- NSR, Qtc 411 ms  Recent Labs: 09/17/2022: ALT 9; BUN 8; Creat 0.79; Hemoglobin 11.7; Platelets 274; Potassium 4.1; Sodium 140; TSH 1.92   Recent Lipid Panel    Component Value Date/Time   CHOL 261 (H) 09/17/2022 1149   TRIG 138 09/17/2022 1149   HDL 52 09/17/2022 1149   CHOLHDL 5.0 (H) 09/17/2022 1149   LDLCALC 181 (H) 09/17/2022 1149     Risk Assessment/Calculations:     Physical Exam:    VS:  BP 118/86 (BP Location: Right Arm, Patient Position: Sitting, Cuff Size: Normal)   Pulse 69   Ht 5\' 4"  (1.626 m)   Wt 245 lb (111.1 kg)   LMP 02/09/2023 (Exact Date)   SpO2 96%   BMI 42.05 kg/m     Wt Readings from Last 3 Encounters:  03/27/23 245 lb (111.1 kg)  02/25/23 248 lb (112.5 kg)  10/01/22 252 lb 9.6 oz (114.6 kg)     GEN:  Well nourished, well developed in no acute distress HEENT: Normal NECK: No JVD CARDIAC: RRR, no murmurs, rubs, gallops RESPIRATORY:  Clear to auscultation without rales, wheezing or rhonchi  ABDOMEN: Soft, non-tender, non-distended MUSCULOSKELETAL:  No  edema; No deformity  SKIN: Warm and dry NEUROLOGIC:  Alert and oriented x 3 PSYCHIATRIC:  Normal affect   ASSESSMENT:   Palpitations: prior TSH was normal last Oct.She does not have high risk features including syncope c/f arrhythmia , family hx of SCD, or abnormalities on her EKG. Discussed if she develops rapid heart rates or syncope to let us know for monitoring. Otherwise recommend hydration/stress relief  PLAN:    In order of problems listed above:  Follow up PRN        Medication Adjustments/Labs and Tests Ordered: Current medicines are reviewed at length with the patient today.  Concerns regarding medicines are outlined above.  No orders of the defined types were placed in this encounter.  No orders of the defined types were placed in this encounter.   There are no Patient Instructions on file for this visit.   Signed, Maisie Fus, MD  03/27/2023 11:34 AM    [facility]

## 2023-03-27 ENCOUNTER — Ambulatory Visit: Payer: Medicaid Other | Attending: Internal Medicine | Admitting: Internal Medicine

## 2023-03-27 ENCOUNTER — Encounter: Payer: Self-pay | Admitting: Internal Medicine

## 2023-03-27 ENCOUNTER — Other Ambulatory Visit: Payer: Medicaid Other

## 2023-03-27 VITALS — BP 118/86 | HR 69 | Ht 64.0 in | Wt 245.0 lb

## 2023-03-27 DIAGNOSIS — E785 Hyperlipidemia, unspecified: Secondary | ICD-10-CM

## 2023-03-27 DIAGNOSIS — R002 Palpitations: Secondary | ICD-10-CM

## 2023-03-27 DIAGNOSIS — I10 Essential (primary) hypertension: Secondary | ICD-10-CM

## 2023-03-27 NOTE — Patient Instructions (Signed)
Medication Instructions:  No Changes In Medications at this time.   *If you need a refill on your cardiac medications before your next appointment, please call your pharmacy*  Follow-Up: At [facility], you and your health needs are our priority.  As part of our continuing mission to provide you with exceptional heart care, we have created designated Provider Care Teams.  These Care Teams include your primary Cardiologist (physician) and Advanced Practice Providers (APPs -  Physician Assistants and Nurse Practitioners) who all work together to provide you with the care you need, when you need it.  Your next appointment:   AS NEEDED   Provider:   Maisie Fus, MD

## 2023-03-28 LAB — LIPID PANEL
Cholesterol: 213 mg/dL — ABNORMAL HIGH (ref <200)
HDL: 50 mg/dL (ref >=50)
LDL Cholesterol (Calc): 144 mg/dL — ABNORMAL HIGH
Non-HDL Cholesterol (Calc): 163 mg/dL — ABNORMAL HIGH (ref <130)
Total CHOL/HDL Ratio: 4.3 (calc) (ref <5.0)
Triglycerides: 85 mg/dL (ref <150)

## 2023-03-28 LAB — COMPLETE METABOLIC PANEL WITH GFR
AG Ratio: 1.4 (calc) (ref 1.0–2.5)
ALT: 8 U/L (ref 6–29)
AST: 11 U/L (ref 10–30)
Albumin: 3.7 g/dL (ref 3.6–5.1)
Alkaline phosphatase (APISO): 76 U/L (ref 31–125)
BUN: 9 mg/dL (ref 7–25)
CO2: 29 mmol/L (ref 20–32)
Calcium: 9 mg/dL (ref 8.6–10.2)
Chloride: 106 mmol/L (ref 98–110)
Creat: 0.84 mg/dL (ref 0.50–0.97)
Globulin: 2.6 g/dL (calc) (ref 1.9–3.7)
Glucose, Bld: 94 mg/dL (ref 65–99)
Potassium: 3.8 mmol/L (ref 3.5–5.3)
Sodium: 140 mmol/L (ref 135–146)
Total Bilirubin: 0.3 mg/dL (ref 0.2–1.2)
Total Protein: 6.3 g/dL (ref 6.1–8.1)
eGFR: 93 mL/min/{1.73_m2} (ref >=60)

## 2023-03-28 LAB — CBC WITH DIFFERENTIAL/PLATELET
Absolute Monocytes: 348 {cells}/uL (ref 200–950)
Basophils Absolute: 22 {cells}/uL (ref 0–200)
Basophils Relative: 0.5 %
Eosinophils Absolute: 31 {cells}/uL (ref 15–500)
Eosinophils Relative: 0.7 %
HCT: 31.8 % — ABNORMAL LOW (ref 35.0–45.0)
Hemoglobin: 10.3 g/dL — ABNORMAL LOW (ref 11.7–15.5)
Lymphs Abs: 2297 {cells}/uL (ref 850–3900)
MCH: 28.1 pg (ref 27.0–33.0)
MCHC: 32.4 g/dL (ref 32.0–36.0)
MCV: 86.6 fL (ref 80.0–100.0)
MPV: 11.9 fL (ref 7.5–12.5)
Monocytes Relative: 7.9 %
Neutro Abs: 1703 {cells}/uL (ref 1500–7800)
Neutrophils Relative %: 38.7 %
Platelets: 260 Thousand/uL (ref 140–400)
RBC: 3.67 Million/uL — ABNORMAL LOW (ref 3.80–5.10)
RDW: 13.4 % (ref 11.0–15.0)
Total Lymphocyte: 52.2 %
WBC: 4.4 Thousand/uL (ref 3.8–10.8)

## 2023-03-28 LAB — TSH: TSH: 1.91 m[IU]/L

## 2023-04-01 ENCOUNTER — Ambulatory Visit: Payer: Medicaid Other | Admitting: Family

## 2023-04-02 ENCOUNTER — Encounter: Payer: Self-pay | Admitting: Family

## 2023-04-02 ENCOUNTER — Ambulatory Visit (INDEPENDENT_AMBULATORY_CARE_PROVIDER_SITE_OTHER): Payer: Medicaid Other | Admitting: Family

## 2023-04-02 VITALS — BP 126/82 | HR 74 | Temp 98.0°F | Resp 17 | Ht 64.0 in | Wt 248.6 lb

## 2023-04-02 DIAGNOSIS — Z6841 Body Mass Index (BMI) 40.0 and over, adult: Secondary | ICD-10-CM | POA: Diagnosis not present

## 2023-04-02 DIAGNOSIS — E785 Hyperlipidemia, unspecified: Secondary | ICD-10-CM

## 2023-04-02 DIAGNOSIS — I10 Essential (primary) hypertension: Secondary | ICD-10-CM | POA: Diagnosis not present

## 2023-04-02 DIAGNOSIS — K219 Gastro-esophageal reflux disease without esophagitis: Secondary | ICD-10-CM | POA: Diagnosis not present

## 2023-04-02 NOTE — Progress Notes (Signed)
Provider: Richarda Blade FNP-C   Shenea Giacobbe, Donalee Citrin, NP  Patient Care Team: Alyse Kathan, Donalee Citrin, NP as PCP - General (Family Medicine) Maisie Fus, MD as PCP - Cardiology (Cardiology)  Extended Emergency Contact Information Primary Emergency Contact: Joost,Maureen Address: [phone] [place] [address] [place] Home Phone: [phone] Relation: Mother Secondary Emergency Contact: Welcome,Charnelle Mobile Phone: [phone] Relation: Sister Preferred language: English Interpreter needed? No  Code Status:  Full Code  Goals of care: Advanced Directive information    04/02/2023    9:21 AM  Advanced Directives  Does Patient Have a Medical Advance Directive? No     Chief Complaint  Patient presents with   Medical Management of Chronic Issues    Patient is here for a 6 month follow up   Health Maintenance    Patient is due for a hep c screeningf, and hpv vaccine    HPI:  Pt is a 32 y.o. female seen today for 6 months follow up for medical management of chronic diseases.   Recent labs reviewed and discussed during visit.Labs unremarkable except Hgb slight lower than previous 10.3 previous 11.7; 9.7;  9.3 Total cholesterol 213  and LDL cholesterol was 144 has improved compared to 6 months ago.   GERD - watching the food that she eats.stopped eating after 8 pm. Does not want any medications.   Does exercise by going to the Gym on elliptical for 25 minutes.Also follows up with weight management on 1400 calories diet.   Past Medical History:  Diagnosis Date   Chlamydia 2011   History of Papanicolaou smear of cervix    Hypertension    Gestatoinal   Low iron    Medical history non-contributory    Sciatica    Past Surgical History:  Procedure Laterality Date   CESAREAN SECTION N/A 05/28/2016   Procedure: CESAREAN SECTION;  Surgeon: Silverio Lay, MD;  Location: [facility] BIRTHING SUIT[facility];  Service: Obstetrics;  Laterality: N/A;   CESAREAN  SECTION WITH BILATERAL TUBAL LIGATION Bilateral 03/08/2022   Procedure: CESAREAN SECTION WITH BILATERAL TUBAL LIGATION;  Surgeon: Myna Hidalgo, DO;  Location: [facility] LD ORS;  Service: Obstetrics;  Laterality: Bilateral;   WISDOM TOOTH EXTRACTION      No Known Allergies  Allergies as of 04/02/2023   No Known Allergies      Medication List        Accurate as of Apr 02, 2023  9:33 AM. If you have any questions, ask your nurse or doctor.          albuterol (2.5 MG/3ML) 0.083% nebulizer solution Commonly known as: PROVENTIL Take 2.5 mg by nebulization every 6 (six) hours as needed for wheezing or shortness of breath.   ferrous sulfate 325 (65 FE) MG tablet Take 325 mg by mouth daily with breakfast.   fluticasone 50 MCG/ACT nasal spray Commonly known as: FLONASE Place 2 sprays into both nostrils daily.   Vitamin D (Ergocalciferol) 1.25 MG (50000 UNIT) Caps capsule Commonly known as: DRISDOL Take 50,000 Units by mouth every 7 (seven) days.        Review of Systems  Constitutional:  Negative for appetite change, chills, fatigue, fever and unexpected weight change.  HENT:  Negative for congestion, dental problem, ear discharge, ear pain, facial swelling, hearing loss, nosebleeds, postnasal drip, rhinorrhea, sinus pressure, sinus pain, sneezing, sore throat, tinnitus and trouble swallowing.   Eyes:  Negative for pain, discharge, redness, itching  and visual disturbance.  Respiratory:  Negative for cough, chest tightness, shortness of breath and wheezing.   Cardiovascular:  Negative for chest pain, palpitations and leg swelling.  Gastrointestinal:  Negative for abdominal distention, abdominal pain, blood in stool, constipation, diarrhea, nausea and vomiting.  Endocrine: Negative for cold intolerance, heat intolerance, polydipsia, polyphagia and polyuria.  Genitourinary:  Negative for difficulty urinating, dysuria, flank pain, frequency and urgency.  Musculoskeletal:  Negative for  arthralgias, back pain, gait problem, joint swelling, myalgias, neck pain and neck stiffness.  Skin:  Negative for color change, pallor, rash and wound.  Neurological:  Negative for dizziness, syncope, speech difficulty, weakness, light-headedness, numbness and headaches.  Hematological:  Does not bruise/bleed easily.  Psychiatric/Behavioral:  Negative for agitation, behavioral problems, confusion, hallucinations, self-injury, sleep disturbance and suicidal ideas. The patient is not nervous/anxious.     Immunization History  Administered Date(s) Administered   Adenovirus 08/15/2011   HPV 9-valent 04/20/2014   Hep A / Hep B 08/15/2011   IPV 08/15/2011   Influenza Nasal 08/15/2011   Meningococcal Conjugate 08/15/2011   Tdap 08/15/2011, 05/16/2016, 01/23/2022   Pertinent  Health Maintenance Due  Topic Date Due   INFLUENZA VACCINE  06/26/2023   PAP SMEAR-Modifier  07/04/2023      07/17/2022    1:48 PM 09/17/2022   10:40 AM 10/01/2022    9:29 AM 02/25/2023    9:52 AM 04/02/2023    9:20 AM  Fall Risk  Falls in the past year?  0 0 0 0  Was there an injury with Fall?  0 0 0 0  Fall Risk Category Calculator  0 0 0 0  Fall Risk Category (Retired)  Low Low    (RETIRED) Patient Fall Risk Level Low fall risk Low fall risk Low fall risk    Patient at Risk for Falls Due to  No Fall Risks No Fall Risks No Fall Risks No Fall Risks  Fall risk Follow up  Falls evaluation completed Falls evaluation completed Falls evaluation completed Falls evaluation completed   Functional Status Survey:    Vitals:   04/02/23 0922  BP: 126/82  Pulse: 74  Resp: 17  Temp: 98 F (36.7 C)  TempSrc: Temporal  SpO2: 96%  Weight: 248 lb 9.6 oz (112.8 kg)  Height: 5\' 4"  (1.626 m)   Body mass index is 42.67 kg/m. Physical Exam Vitals reviewed.  Constitutional:      General: She is not in acute distress.    Appearance: Normal appearance. She is morbidly obese. She is not ill-appearing or diaphoretic.  HENT:      Head: Normocephalic.     Right Ear: Tympanic membrane, ear canal and external ear normal. There is no impacted cerumen.     Left Ear: Tympanic membrane, ear canal and external ear normal. There is no impacted cerumen.     Nose: Nose normal. No congestion or rhinorrhea.     Mouth/Throat:     Mouth: Mucous membranes are moist.     Pharynx: Oropharynx is clear. No oropharyngeal exudate or posterior oropharyngeal erythema.  Eyes:     General: No scleral icterus.       Right eye: No discharge.        Left eye: No discharge.     Extraocular Movements: Extraocular movements intact.     Conjunctiva/sclera: Conjunctivae normal.     Pupils: Pupils are equal, round, and reactive to light.  Neck:     Vascular: No carotid bruit.  Cardiovascular:  Rate and Rhythm: Normal rate and regular rhythm.     Pulses: Normal pulses.     Heart sounds: Normal heart sounds. No murmur heard.    No friction rub. No gallop.  Pulmonary:     Effort: Pulmonary effort is normal. No respiratory distress.     Breath sounds: Normal breath sounds. No wheezing, rhonchi or rales.  Chest:     Chest wall: No tenderness.  Abdominal:     General: Bowel sounds are normal. There is no distension.     Palpations: Abdomen is soft. There is no mass.     Tenderness: There is no abdominal tenderness. There is no right CVA tenderness, left CVA tenderness, guarding or rebound.  Musculoskeletal:        General: No swelling or tenderness. Normal range of motion.     Cervical back: Normal range of motion. No rigidity or tenderness.     Right lower leg: No edema.     Left lower leg: No edema.  Lymphadenopathy:     Cervical: No cervical adenopathy.  Skin:    General: Skin is warm and dry.     Coloration: Skin is not pale.     Findings: No bruising, erythema, lesion or rash.  Neurological:     Mental Status: She is alert and oriented to person, place, and time.     Cranial Nerves: No cranial nerve deficit.     Sensory: No  sensory deficit.     Motor: No weakness.     Coordination: Coordination normal.     Gait: Gait normal.  Psychiatric:        Mood and Affect: Mood normal.        Speech: Speech normal.        Behavior: Behavior normal.        Thought Content: Thought content normal.        Judgment: Judgment normal.    Labs reviewed: Recent Labs    09/17/22 1149 03/27/23 0858  NA 140 140  K 4.1 3.8  CL 104 106  CO2 29 29  GLUCOSE 86 94  BUN 8 9  CREATININE 0.79 0.84  CALCIUM 9.2 9.0   Recent Labs    09/17/22 1149 03/27/23 0858  AST 12 11  ALT 9 8  BILITOT 0.5 0.3  PROT 6.9 6.3   Recent Labs    09/17/22 1149 03/27/23 0858  WBC 4.6 4.4  NEUTROABS 2,006 1,703  HGB 11.7 10.3*  HCT 35.9 31.8*  MCV 88.2 86.6  PLT 274 260   Lab Results  Component Value Date   TSH 1.91 03/27/2023   No results found for: "HGBA1C" Lab Results  Component Value Date   CHOL 213 (H) 03/27/2023   HDL 50 03/27/2023   LDLCALC 144 (H) 03/27/2023   TRIG 85 03/27/2023   CHOLHDL 4.3 03/27/2023    Significant Diagnostic Results in last 30 days:  No results found.  Assessment/Plan 1. Hyperlipidemia LDL goal <100 Recent LDL has improved but still not at goal -Dietary modification and exercise at least 3 times per week for 30 minutes advised - Lipid panel; Future  2. Primary hypertension Blood pressure well-controlled -Currently off medication - TSH; Future - COMPLETE METABOLIC PANEL WITH GFR; Future - CBC with Differential/Platelet; Future  3. Gastroesophageal reflux disease without esophagitis Symptoms controlled by watching what she eats and avoiding eating too late in the evenings before bedtime. Has not required antiacids. - CBC with Differential/ Platelet; Future  4. Body mass index (  BMI) of 40.1-44.9 in adult (HCC) BMI 42.67 with associated comorbidities hypertension, hyperlipidemia and GERD. -Dietary modification and exercise advised -Continue to follow-up with weight  management  Family/ staff Communication: Reviewed plan of care with patient verbalized understanding  Labs/tests ordered:  - CBC with Differential/Platelet - CMP with eGFR([facility]) - TSH - Lipid panel  Next Appointment : Return in about 6 months (around 10/03/2023) for medical mangement of chronic issues., Fasting labs in 6 months prior to visit.   Caesar Bookman, NP

## 2023-05-12 ENCOUNTER — Ambulatory Visit: Payer: Medicaid Other | Admitting: Nurse Practitioner

## 2023-05-15 ENCOUNTER — Encounter: Payer: Self-pay | Admitting: Nurse Practitioner

## 2023-05-15 ENCOUNTER — Ambulatory Visit (INDEPENDENT_AMBULATORY_CARE_PROVIDER_SITE_OTHER): Payer: Medicaid Other | Admitting: Nurse Practitioner

## 2023-05-15 VITALS — BP 132/78 | HR 79 | Temp 97.1°F | Wt 239.2 lb

## 2023-05-15 DIAGNOSIS — L819 Disorder of pigmentation, unspecified: Secondary | ICD-10-CM

## 2023-05-15 DIAGNOSIS — L0201 Cutaneous abscess of face: Secondary | ICD-10-CM

## 2023-05-15 DIAGNOSIS — Z6841 Body Mass Index (BMI) 40.0 and over, adult: Secondary | ICD-10-CM

## 2023-05-15 DIAGNOSIS — Z13220 Encounter for screening for lipoid disorders: Secondary | ICD-10-CM

## 2023-05-15 DIAGNOSIS — E538 Deficiency of other specified B group vitamins: Secondary | ICD-10-CM

## 2023-05-15 DIAGNOSIS — Z1329 Encounter for screening for other suspected endocrine disorder: Secondary | ICD-10-CM | POA: Diagnosis not present

## 2023-05-15 MED ORDER — VITAMIN B-12 1000 MCG PO TABS: 1000.0000 ug | ORAL_TABLET | Freq: Every day | ORAL | 2 refills | Status: DC

## 2023-05-15 MED ORDER — DOXYCYCLINE HYCLATE 100 MG PO TABS
100.0000 mg | ORAL_TABLET | Freq: Two times a day (BID) | ORAL | 0 refills | Status: AC
Start: 2023-05-15 — End: 2023-05-25

## 2023-05-15 NOTE — Progress Notes (Signed)
@Patient  ID: Paula [name], female    DOB: 07-14-1991, 32 y.o.   MRN: [id number]  Chief Complaint  Patient presents with   Establish Care    Over all health and weight    Referring provider: Ngetich, Donalee Citrin, NP   HPI  Patient presents today to establish care.  She is concerned about her weight.  She is currently going to [facility] for weight loss.  She is currently on phentermine 15 mg daily.  She has been exercising for the past 7 weeks.  She feels like she has not lost any weight.  She is interested in a referral to a different weight loss clinic.  Patient also complains today of discoloration to the skin on her face.  She states that she does pick at her skin.  That she has been picking at them feels raised and painful.  She is worried that it could be infected.  We will order doxycycline for this today.  Patient would also like a referral to dermatology. Denies f/c/s, n/v/d, hemoptysis, PND, leg swelling Denies chest pain or edema        No Known Allergies  Immunization History  Administered Date(s) Administered   Adenovirus 08/15/2011   HPV 9-valent 04/20/2014   Hep A / Hep B 08/15/2011   IPV 08/15/2011   Influenza Nasal 08/15/2011   Meningococcal Conjugate 08/15/2011   Tdap 08/15/2011, 05/16/2016, 01/23/2022    Past Medical History:  Diagnosis Date   Chlamydia 2011   History of Papanicolaou smear of cervix    Hypertension    Gestatoinal   Low iron    Medical history non-contributory    Sciatica     Tobacco History: Social History   Tobacco Use  Smoking Status Never  Smokeless Tobacco Never   Counseling given: Not Answered   Outpatient Encounter Medications as of 05/15/2023  Medication Sig   cyanocobalamin (VITAMIN B12) 1000 MCG tablet Take 1 tablet (1,000 mcg total) by mouth daily.   doxycycline (VIBRA-TABS) 100 MG tablet Take 1 tablet (100 mg total) by mouth 2 (two) times daily for 10 days.   ferrous sulfate 325 (65 FE) MG tablet Take 325  mg by mouth daily with breakfast.   phentermine 15 MG capsule Take 15 mg by mouth daily.   Vitamin D, Ergocalciferol, (DRISDOL) 1.25 MG (50000 UNIT) CAPS capsule Take 50,000 Units by mouth every 7 (seven) days.   albuterol (PROVENTIL) (2.5 MG/3ML) 0.083% nebulizer solution Take 2.5 mg by nebulization every 6 (six) hours as needed for wheezing or shortness of breath. (Patient not taking: Reported on 05/15/2023)   fluticasone (FLONASE) 50 MCG/ACT nasal spray Place 2 sprays into both nostrils daily. (Patient not taking: Reported on 05/15/2023)   [DISCONTINUED] levonorgestrel (MIRENA) 20 MCG/24HR IUD 1 each by Intrauterine route once.   No facility-administered encounter medications on file as of 05/15/2023.     Review of Systems  Review of Systems  Constitutional: Negative.   HENT: Negative.    Cardiovascular: Negative.   Gastrointestinal: Negative.   Allergic/Immunologic: Negative.   Neurological: Negative.   Psychiatric/Behavioral: Negative.         Physical Exam  BP 132/78   Pulse 79   Temp (!) 97.1 F (36.2 C)   Wt 239 lb 3.2 oz (108.5 kg)   SpO2 100%   BMI 41.06 kg/m   Wt Readings from Last 5 Encounters:  05/15/23 239 lb 3.2 oz (108.5 kg)  04/02/23 248 lb 9.6 oz (112.8 kg)  03/27/23 245  lb (111.1 kg)  02/25/23 248 lb (112.5 kg)  10/01/22 252 lb 9.6 oz (114.6 kg)     Physical Exam Vitals and nursing note reviewed.  Constitutional:      General: She is not in acute distress.    Appearance: She is well-developed.  Cardiovascular:     Rate and Rhythm: Normal rate and regular rhythm.  Pulmonary:     Effort: Pulmonary effort is normal.     Breath sounds: Normal breath sounds.  Neurological:     Mental Status: She is alert and oriented to person, place, and time.      Lab Results:  CBC    Component Value Date/Time   WBC 4.4 03/27/2023 0858   RBC 3.67 (L) 03/27/2023 0858   HGB 10.3 (L) 03/27/2023 0858   HGB 10.2 (L) 01/25/2022 0925   HGB 11.5 10/01/2021  0000   HCT 31.8 (L) 03/27/2023 0858   HCT 29.8 (L) 01/25/2022 0925   HCT 34 10/01/2021 0000   PLT 260 03/27/2023 0858   PLT 199 01/25/2022 0925   MCV 86.6 03/27/2023 0858   MCV 92 01/25/2022 0925   MCH 28.1 03/27/2023 0858   MCHC 32.4 03/27/2023 0858   RDW 13.4 03/27/2023 0858   RDW 12.9 01/25/2022 0925   LYMPHSABS 2,297 03/27/2023 0858   MONOABS 0.8 02/20/2022 0937   EOSABS 31 03/27/2023 0858   BASOSABS 22 03/27/2023 0858    BMET    Component Value Date/Time   NA 140 03/27/2023 0858   K 3.8 03/27/2023 0858   CL 106 03/27/2023 0858   CO2 29 03/27/2023 0858   GLUCOSE 94 03/27/2023 0858   BUN 9 03/27/2023 0858   CREATININE 0.84 03/27/2023 0858   CALCIUM 9.0 03/27/2023 0858   GFRNONAA >60 03/14/2022 0054   GFRAA >60 02/24/2017 2037    BNP    Component Value Date/Time   BNP 201.4 (H) 06/03/2016 1743      Assessment & Plan:   Class 3 severe obesity due to excess calories without serious comorbidity with body mass index (BMI) of 40.0 to 44.9 in adult (HCC) - Amb Ref to Medical Weight Management - CBC - Basic Metabolic Panel   2. Thyroid disorder screening  - TSH   3. Vitamin B12 deficiency  - Vitamin B12   4. Lipid screening  - Lipid Panel   5. Discoloration of skin of face  - Ambulatory referral to Dermatology   6. Cutaneous abscess of face  - doxycycline (VIBRA-TABS) 100 MG tablet; Take 1 tablet (100 mg total) by mouth 2 (two) times daily for 10 days.  Dispense: 20 tablet; Refill: 0    Follow up:  Follow up in 3 months     Ivonne Andrew, NP 05/15/2023

## 2023-05-15 NOTE — Patient Instructions (Addendum)
1. Class 3 severe obesity due to excess calories without serious comorbidity with body mass index (BMI) of 40.0 to 44.9 in adult (HCC)  - Amb Ref to Medical Weight Management - CBC - Basic Metabolic Panel   2. Thyroid disorder screening  - TSH   3. Vitamin B12 deficiency  - Vitamin B12   4. Lipid screening  - Lipid Panel   5. Discoloration of skin of face  - Ambulatory referral to Dermatology   6. Cutaneous abscess of face  - doxycycline (VIBRA-TABS) 100 MG tablet; Take 1 tablet (100 mg total) by mouth 2 (two) times daily for 10 days.  Dispense: 20 tablet; Refill: 0    Follow up:  Follow up in 3 months

## 2023-05-15 NOTE — Assessment & Plan Note (Signed)
-   Amb Ref to Medical Weight Management - CBC - Basic Metabolic Panel   2. Thyroid disorder screening  - TSH   3. Vitamin B12 deficiency  - Vitamin B12   4. Lipid screening  - Lipid Panel   5. Discoloration of skin of face  - Ambulatory referral to Dermatology   6. Cutaneous abscess of face  - doxycycline (VIBRA-TABS) 100 MG tablet; Take 1 tablet (100 mg total) by mouth 2 (two) times daily for 10 days.  Dispense: 20 tablet; Refill: 0    Follow up:  Follow up in 3 months

## 2023-05-16 LAB — LIPID PANEL
Chol/HDL Ratio: 4.4 ratio (ref 0.0–4.4)
Cholesterol, Total: 210 mg/dL — ABNORMAL HIGH (ref 100–199)
HDL: 48 mg/dL (ref >39)
LDL Chol Calc (NIH): 148 mg/dL — ABNORMAL HIGH (ref 0–99)
Triglycerides: 77 mg/dL (ref 0–149)
VLDL Cholesterol Cal: 14 mg/dL (ref 5–40)

## 2023-05-16 LAB — VITAMIN B12: Vitamin B-12: 344 pg/mL (ref 232–1245)

## 2023-05-16 LAB — TSH: TSH: 3.81 u[IU]/mL (ref 0.450–4.500)

## 2023-05-16 LAB — BASIC METABOLIC PANEL
BUN/Creatinine Ratio: 8 — ABNORMAL LOW (ref 9–23)
BUN: 8 mg/dL (ref 6–20)
CO2: 23 mmol/L (ref 20–29)
Calcium: 9.4 mg/dL (ref 8.7–10.2)
Chloride: 104 mmol/L (ref 96–106)
Creatinine, Ser: 0.99 mg/dL (ref 0.57–1.00)
Glucose: 91 mg/dL (ref 70–99)
Potassium: 4.5 mmol/L (ref 3.5–5.2)
Sodium: 141 mmol/L (ref 134–144)
eGFR: 77 mL/min/{1.73_m2} (ref >59)

## 2023-05-16 LAB — CBC
Hematocrit: 37.8 % (ref 34.0–46.6)
Hemoglobin: 12 g/dL (ref 11.1–15.9)
MCH: 28.4 pg (ref 26.6–33.0)
MCHC: 31.7 g/dL (ref 31.5–35.7)
MCV: 89 fL (ref 79–97)
Platelets: 253 10*3/uL (ref 150–450)
RBC: 4.23 x10E6/uL (ref 3.77–5.28)
RDW: 13.6 % (ref 11.7–15.4)
WBC: 5 10*3/uL (ref 3.4–10.8)

## 2023-08-15 ENCOUNTER — Encounter: Payer: Self-pay | Admitting: Nurse Practitioner

## 2023-08-15 ENCOUNTER — Ambulatory Visit (INDEPENDENT_AMBULATORY_CARE_PROVIDER_SITE_OTHER): Payer: Medicaid Other | Admitting: Nurse Practitioner

## 2023-08-15 VITALS — BP 132/83 | HR 86 | Temp 97.2°F | Resp 16 | Ht 64.0 in | Wt 240.8 lb

## 2023-08-15 DIAGNOSIS — N941 Unspecified dyspareunia: Secondary | ICD-10-CM

## 2023-08-15 DIAGNOSIS — L819 Disorder of pigmentation, unspecified: Secondary | ICD-10-CM | POA: Diagnosis not present

## 2023-08-15 NOTE — Patient Instructions (Signed)
1. Discoloration of skin of face  - Ambulatory referral to Dermatology   2. Pain in female genitalia on intercourse  - Ambulatory referral to Obstetrics / Gynecology   Follow up:  Follow up in 3 months

## 2023-08-15 NOTE — Progress Notes (Signed)
Subjective   Patient ID: Paula [name], female    DOB: [birth year], 32 y.o.   MRN: [id number]  Chief Complaint  Patient presents with   Obesity    Referring provider: Ivonne Andrew, NP  Bo Merino Coreen Petriello is a 32 y.o. female with Past Medical History: 2011: Chlamydia No date: History of Papanicolaou smear of cervix No date: Hypertension     Comment:  Gestatoinal No date: Low iron No date: Medical history non-contributory No date: Sciatica   HPI  Patient presents today for follow-up.  She has been seeing weight management through [facility].  They have started her on Wegovy.  Patient is considering gastric surgery.  Overall she has been doing well since her last visit here.  She did request a referral to dermatology at last visit due to skin discoloration to her face.  She did not get a call to schedule the appointment from dermatology.  We will repeat the referral today. Denies f/c/s, n/v/d, hemoptysis, PND, leg swelling Denies chest pain or edema     No Known Allergies  Immunization History  Administered Date(s) Administered   Adenovirus 08/15/2011   HPV 9-valent 04/20/2014   Hep A / Hep B 08/15/2011   IPV 08/15/2011   Influenza Nasal 08/15/2011   Meningococcal Conjugate 08/15/2011   Tdap 08/15/2011, 05/16/2016, 01/23/2022    Tobacco History: Social History   Tobacco Use  Smoking Status Never  Smokeless Tobacco Never   Counseling given: Not Answered   Outpatient Encounter Medications as of 08/15/2023  Medication Sig   cyanocobalamin (VITAMIN B12) 1000 MCG tablet Take 1 tablet (1,000 mcg total) by mouth daily.   ferrous sulfate 325 (65 FE) MG tablet Take 325 mg by mouth daily with breakfast.   Flaxseed, Linseed, (FLAX SEED OIL PO) Take by mouth.   fluticasone (FLONASE) 50 MCG/ACT nasal spray Place 2 sprays into both nostrils daily. (Patient taking differently: Place 2 sprays into both nostrils daily. PRN)   Omega-3 Fatty Acids (FISH OIL  PO) Take by mouth.   Semaglutide-Weight Management (WEGOVY) 0.5 MG/0.5ML SOAJ Inject 0.5 mg into the skin.   [DISCONTINUED] albuterol (PROVENTIL) (2.5 MG/3ML) 0.083% nebulizer solution Take 2.5 mg by nebulization every 6 (six) hours as needed for wheezing or shortness of breath. (Patient not taking: Reported on 05/15/2023)   [DISCONTINUED] levonorgestrel (MIRENA) 20 MCG/24HR IUD 1 each by Intrauterine route once.   [DISCONTINUED] phentermine 15 MG capsule Take 15 mg by mouth daily. (Patient not taking: Reported on 08/15/2023)   [DISCONTINUED] Vitamin D, Ergocalciferol, (DRISDOL) 1.25 MG (50000 UNIT) CAPS capsule Take 50,000 Units by mouth every 7 (seven) days. (Patient not taking: Reported on 08/15/2023)   No facility-administered encounter medications on file as of 08/15/2023.    Review of Systems  Review of Systems  Constitutional: Negative.   HENT: Negative.    Cardiovascular: Negative.   Gastrointestinal: Negative.   Allergic/Immunologic: Negative.   Neurological: Negative.   Psychiatric/Behavioral: Negative.       Objective:   BP 132/83   Pulse 86   Temp (!) 97.2 F (36.2 C)   Resp 16   Ht 5\' 4"  (1.626 m)   Wt 240 lb 12.8 oz (109.2 kg)   SpO2 97%   BMI 41.33 kg/m   Wt Readings from Last 5 Encounters:  08/15/23 240 lb 12.8 oz (109.2 kg)  05/15/23 239 lb 3.2 oz (108.5 kg)  04/02/23 248 lb 9.6 oz (112.8 kg)  03/27/23 245 lb (111.1 kg)  02/25/23 248 lb (  112.5 kg)     Physical Exam Vitals and nursing note reviewed.  Constitutional:      General: She is not in acute distress.    Appearance: She is well-developed.  Cardiovascular:     Rate and Rhythm: Normal rate and regular rhythm.  Pulmonary:     Effort: Pulmonary effort is normal.     Breath sounds: Normal breath sounds.  Neurological:     Mental Status: She is alert and oriented to person, place, and time.       Assessment & Plan:   Discoloration of skin of face -     Ambulatory referral to  Dermatology  Pain in female genitalia on intercourse -     Ambulatory referral to Obstetrics / Gynecology     Return in about 1 year (around 08/14/2024) for physical.   Ivonne Andrew, NP 08/15/2023

## 2023-09-30 ENCOUNTER — Other Ambulatory Visit: Payer: Medicaid Other

## 2023-10-03 ENCOUNTER — Ambulatory Visit: Payer: Medicaid Other | Admitting: Family

## 2023-11-03 ENCOUNTER — Other Ambulatory Visit (HOSPITAL_COMMUNITY)
Admission: RE | Admit: 2023-11-03 | Discharge: 2023-11-03 | Disposition: A | Discharge status: Home / Self Care | Payer: Medicaid Other | Source: Ambulatory Visit | Attending: Obstetrics and Gynecology | Admitting: Obstetrics and Gynecology

## 2023-11-03 ENCOUNTER — Encounter: Payer: Self-pay | Admitting: Obstetrics and Gynecology

## 2023-11-03 ENCOUNTER — Ambulatory Visit: Payer: Medicaid Other | Admitting: Obstetrics and Gynecology

## 2023-11-03 VITALS — BP 133/85 | HR 79 | Ht 64.0 in | Wt 232.8 lb

## 2023-11-03 DIAGNOSIS — Z01419 Encounter for gynecological examination (general) (routine) without abnormal findings: Secondary | ICD-10-CM

## 2023-11-03 NOTE — Progress Notes (Signed)
Pt. Presents for painful intercourse.

## 2023-11-03 NOTE — Progress Notes (Signed)
Subjective:     Paula [name] is a 32 y.o. female P2 with LMP 10/28/2023 and NMI 39 who is here for a comprehensive physical exam. The patient reports no problems. She is sexually active using BTL for contraception. She reports some lower abdominal discomfort with intercourse. She denies any urinary incontinence or issues with bowel movement. She reports a monthly period lasting 7 days. Patient is without any other complaints  Past Medical History:  Diagnosis Date   Chlamydia 2011   History of Papanicolaou smear of cervix    Hypertension    Gestatoinal   Low iron    Medical history non-contributory    Sciatica    Past Surgical History:  Procedure Laterality Date   CESAREAN SECTION N/A 05/28/2016   Procedure: CESAREAN SECTION;  Surgeon: Silverio Lay, MD;  Location: [facility];  Service: Obstetrics;  Laterality: N/A;   CESAREAN SECTION WITH BILATERAL TUBAL LIGATION Bilateral 03/08/2022   Procedure: CESAREAN SECTION WITH BILATERAL TUBAL LIGATION;  Surgeon: Myna Hidalgo, DO;  Location: [facility] LD ORS;  Service: Obstetrics;  Laterality: Bilateral;   WISDOM TOOTH EXTRACTION     Family History  Problem Relation Age of Onset   Leukemia Mother    Healthy Father    Hypertension Brother    Hypertension Maternal Grandmother    Heart disease Maternal Grandmother    Diabetes Maternal Grandmother     Social History   Socioeconomic History   Marital status: Married    Spouse name: Not on file   Number of children: Not on file   Years of education: Not on file   Highest education level: Some college, no degree  Occupational History   Not on file  Tobacco Use   Smoking status: Never   Smokeless tobacco: Never  Vaping Use   Vaping status: Never Used  Substance and Sexual Activity   Alcohol use: Never   Drug use: No   Sexual activity: Yes    Birth control/protection: Surgical    Comment: Tubes removed  Other Topics Concern   Not on file  Social History Narrative   Not  on file   Social Determinants of Health   Financial Resource Strain: Low Risk  (08/01/2023)   Received from [facility]   Overall Financial Resource Strain (CARDIA)    Difficulty of Paying Living Expenses: Not hard at all  Food Insecurity: No Food Insecurity (08/01/2023)   Received from [facility]   Hunger Vital Sign    Worried About Running Out of Food in the Last Year: Never true    Ran Out of Food in the Last Year: Never true  Transportation Needs: No Transportation Needs (08/01/2023)   Received from [facility] - Transportation    Lack of Transportation (Medical): No    Lack of Transportation (Non-Medical): No  Physical Activity: Insufficiently Active (08/01/2023)   Received from [facility]   Exercise Vital Sign    Days of Exercise per Week: 3 days    Minutes of Exercise per Session: 30 min  Stress: No Stress Concern Present (08/01/2023)   Received from [facility] - Occupational Stress Questionnaire    Feeling of Stress : Not at all  Social Connections: Socially Integrated (08/01/2023)   Received from [facility]   Social Network    How would you rate your social network (family, work, friends)?: Good participation with social networks  Intimate Partner Violence: Not At Risk (08/01/2023)  Received from [facility]   HITS    Over the last 12 months how often did your partner physically hurt you?: Never    Over the last 12 months how often did your partner insult you or talk down to you?: Never    Over the last 12 months how often did your partner threaten you with physical harm?: Never    Over the last 12 months how often did your partner scream or curse at you?: Never   Health Maintenance  Topic Date Due   Hepatitis C Screening  Never done   HPV VACCINES (2 - 3-dose series) 05/18/2014   INFLUENZA VACCINE  02/23/2024 (Originally 06/26/2023)   Cervical Cancer Screening (HPV/Pap Cotest)  07/03/2025   DTaP/Tdap/Td (4  - Td or Tdap) 01/24/2032   HIV Screening  Completed   COVID-19 Vaccine  Discontinued       Review of Systems Pertinent items noted in HPI and remainder of comprehensive ROS otherwise negative.   Objective:  Blood pressure 133/85, pulse 79, height 5\' 4"  (1.626 m), weight 232 lb 12.8 oz (105.6 kg), last menstrual period 10/28/2023, currently breastfeeding.   GENERAL: Well-developed, well-nourished female in no acute distress.  HEENT: Normocephalic, atraumatic. Sclerae anicteric.  NECK: Supple. Normal thyroid.  LUNGS: Clear to auscultation bilaterally.  HEART: Regular rate and rhythm. BREASTS: Symmetric in size. No palpable masses or lymphadenopathy, skin changes, or nipple drainage. ABDOMEN: Soft, nontender, nondistended. No organomegaly. PELVIC: Normal external female genitalia. Vagina is pink and rugated.  Normal discharge. Normal appearing cervix. Uterus is 10-12 weeks in size and very mobile. No adnexal mass or tenderness. Chaperone present during the pelvic exam EXTREMITIES: No cyanosis, clubbing, or edema, 2+ distal pulses.     Assessment:    Healthy female exam.      Plan:    Pap smear collected Vaginal swab collected Pelvic ultrasound ordered to rule out fibroid uterus Patient will be contacted with abnormal results See After Visit Summary for Counseling Recommendations

## 2023-11-04 LAB — CERVICOVAGINAL ANCILLARY ONLY
Bacterial Vaginitis (gardnerella): POSITIVE — AB
Candida Glabrata: NEGATIVE
Candida Vaginitis: NEGATIVE
Chlamydia: NEGATIVE
Comment: NEGATIVE
Comment: NEGATIVE
Comment: NEGATIVE
Comment: NEGATIVE
Comment: NORMAL
Neisseria Gonorrhea: NEGATIVE

## 2023-11-04 MED ORDER — METRONIDAZOLE 500 MG PO TABS: 500.0000 mg | ORAL_TABLET | Freq: Two times a day (BID) | ORAL | 0 refills | Status: DC

## 2023-11-04 NOTE — Addendum Note (Signed)
Addended by: Catalina Antigua on: 11/04/2023 03:04 PM   Modules accepted: Orders

## 2023-11-05 LAB — CYTOLOGY - PAP
Comment: NEGATIVE
Diagnosis: UNDETERMINED — AB
High risk HPV: NEGATIVE

## 2023-11-06 ENCOUNTER — Ambulatory Visit (HOSPITAL_BASED_OUTPATIENT_CLINIC_OR_DEPARTMENT_OTHER)
Admission: RE | Admit: 2023-11-06 | Discharge: 2023-11-06 | Disposition: A | Discharge status: Home / Self Care | Payer: Medicaid Other | Source: Ambulatory Visit | Attending: Obstetrics and Gynecology | Admitting: Obstetrics and Gynecology

## 2023-11-06 DIAGNOSIS — Z01419 Encounter for gynecological examination (general) (routine) without abnormal findings: Secondary | ICD-10-CM | POA: Diagnosis present

## 2023-11-10 ENCOUNTER — Telehealth: Payer: Self-pay | Admitting: *Deleted

## 2023-11-10 NOTE — Telephone Encounter (Signed)
Pt called to office for u/s results.  Pt made aware u/s has not yet resulted, result time is taking a little longer than normal. Advised if she does not get results by end of next week to call the office.

## 2023-11-13 ENCOUNTER — Encounter: Payer: Self-pay | Admitting: Obstetrics and Gynecology

## 2023-11-13 ENCOUNTER — Other Ambulatory Visit (HOSPITAL_COMMUNITY): Payer: Medicaid Other

## 2023-11-17 ENCOUNTER — Other Ambulatory Visit: Payer: Self-pay | Admitting: Obstetrics and Gynecology

## 2023-11-17 DIAGNOSIS — R32 Unspecified urinary incontinence: Secondary | ICD-10-CM

## 2023-11-18 ENCOUNTER — Other Ambulatory Visit: Payer: Self-pay

## 2023-11-18 ENCOUNTER — Emergency Department (HOSPITAL_BASED_OUTPATIENT_CLINIC_OR_DEPARTMENT_OTHER)
Admission: EM | Admit: 2023-11-18 | Discharge: 2023-11-18 | Disposition: A | Discharge status: Home / Self Care | Payer: Medicaid Other | Attending: Emergency Medicine | Admitting: Emergency Medicine

## 2023-11-18 ENCOUNTER — Encounter (HOSPITAL_BASED_OUTPATIENT_CLINIC_OR_DEPARTMENT_OTHER): Payer: Self-pay

## 2023-11-18 ENCOUNTER — Emergency Department (HOSPITAL_BASED_OUTPATIENT_CLINIC_OR_DEPARTMENT_OTHER): Payer: Medicaid Other

## 2023-11-18 DIAGNOSIS — I10 Essential (primary) hypertension: Secondary | ICD-10-CM | POA: Diagnosis not present

## 2023-11-18 DIAGNOSIS — K8020 Calculus of gallbladder without cholecystitis without obstruction: Secondary | ICD-10-CM

## 2023-11-18 DIAGNOSIS — Z79899 Other long term (current) drug therapy: Secondary | ICD-10-CM | POA: Diagnosis not present

## 2023-11-18 DIAGNOSIS — R1031 Right lower quadrant pain: Secondary | ICD-10-CM | POA: Diagnosis present

## 2023-11-18 LAB — COMPREHENSIVE METABOLIC PANEL
ALT: 9 U/L (ref 0–44)
AST: 11 U/L — ABNORMAL LOW (ref 15–41)
Albumin: 3.8 g/dL (ref 3.5–5.0)
Alkaline Phosphatase: 60 U/L (ref 38–126)
Anion gap: 5 (ref 5–15)
BUN: 8 mg/dL (ref 6–20)
CO2: 30 mmol/L (ref 22–32)
Calcium: 9.1 mg/dL (ref 8.9–10.3)
Chloride: 105 mmol/L (ref 98–111)
Creatinine, Ser: 0.81 mg/dL (ref 0.44–1.00)
GFR, Estimated: >60 mL/min (ref >60)
Glucose, Bld: 83 mg/dL (ref 70–99)
Potassium: 3.8 mmol/L (ref 3.5–5.1)
Sodium: 140 mmol/L (ref 135–145)
Total Bilirubin: 0.6 mg/dL (ref <1.2)
Total Protein: 6.7 g/dL (ref 6.5–8.1)

## 2023-11-18 LAB — URINALYSIS, ROUTINE W REFLEX MICROSCOPIC
Bacteria, UA: NONE SEEN
Bilirubin Urine: NEGATIVE
Glucose, UA: NEGATIVE mg/dL
Hgb urine dipstick: NEGATIVE
Ketones, ur: NEGATIVE mg/dL
Leukocytes,Ua: NEGATIVE
Nitrite: NEGATIVE
Specific Gravity, Urine: 1.029 (ref 1.005–1.030)
pH: 5.5 (ref 5.0–8.0)

## 2023-11-18 LAB — CBC
HCT: 35.2 % — ABNORMAL LOW (ref 36.0–46.0)
Hemoglobin: 11.5 g/dL — ABNORMAL LOW (ref 12.0–15.0)
MCH: 29.3 pg (ref 26.0–34.0)
MCHC: 32.7 g/dL (ref 30.0–36.0)
MCV: 89.8 fL (ref 80.0–100.0)
Platelets: 243 10*3/uL (ref 150–400)
RBC: 3.92 MIL/uL (ref 3.87–5.11)
RDW: 13.2 % (ref 11.5–15.5)
WBC: 4.5 10*3/uL (ref 4.0–10.5)
nRBC: 0 % (ref 0.0–0.2)

## 2023-11-18 LAB — LIPASE, BLOOD: Lipase: 20 U/L (ref 11–51)

## 2023-11-18 LAB — PREGNANCY, URINE: Preg Test, Ur: NEGATIVE

## 2023-11-18 MED ORDER — IOHEXOL 300 MG/ML  SOLN
100.0000 mL | Freq: Once | INTRAMUSCULAR | Status: AC | PRN
  Administered 2023-11-18: 100 mL via INTRAVENOUS

## 2023-11-18 NOTE — Discharge Instructions (Signed)
As we discussed, your workup in the ER today was reassuring for acute findings.  Laboratory evaluation and CT imaging did not reveal any emergent cause of your symptoms.  I recommend that you follow-up closely with your primary care doctor.  Please get plenty of rest and maintain adequate oral hydration.  Return if development of any new or worsening symptoms.

## 2023-11-18 NOTE — ED Notes (Signed)
Pt discharged home and given discharge paperwork. Opportunities given for questions. Pt verbalizes understanding. PIV removed x1. Stone,Heather R , RN 

## 2023-11-18 NOTE — ED Triage Notes (Signed)
Patient arrives with complaints of LLQ abdominal that started this morning. Rates her pain a 5/10. Reports that she is also on Wegovy injections.

## 2023-11-18 NOTE — ED Provider Notes (Signed)
[place] EMERGENCY DEPARTMENT AT [facility] Provider Note   CSN: [id number] Arrival date & time: 11/18/23  1354     History  Chief Complaint  Patient presents with   Abdominal Pain    Paula [name] is a 32 y.o. female.  Patient with history of c section, tubal ligation presents today with complaints of abdominal pain. She states that same began initially this morning and has been persistent since. Pain is in the RLQ and does not radiate. Denies history of similar symptoms previously.  No aggravating or relieving factors.  Denies nausea, vomiting, or diarrhea.  No fevers or chills.  Patient states that at its worst the pain is a 5/10. Denies urinary symptoms or vaginal discharge. LMP was at the beginning of December. She is sexually active, denies concern for STDs, states she was tested for same about 1 week ago and had BV and was treated with resolution of her symptoms.   The history is provided by the patient. No language interpreter was used.  Abdominal Pain      Home Medications Prior to Admission medications   Medication Sig Start Date End Date Taking? Authorizing Provider  cyanocobalamin (VITAMIN B12) 1000 MCG tablet Take 1 tablet (1,000 mcg total) by mouth daily. 05/15/23   Ivonne Andrew, NP  ferrous sulfate 325 (65 FE) MG tablet Take 325 mg by mouth daily with breakfast.    [provider]  Flaxseed, Linseed, (FLAX SEED OIL PO) Take by mouth.    [provider]  fluticasone (FLONASE) 50 MCG/ACT nasal spray Place 2 sprays into both nostrils daily. Patient not taking: Reported on 11/03/2023 09/17/22   Ngetich, Dinah C, NP  metroNIDAZOLE (FLAGYL) 500 MG tablet Take 1 tablet (500 mg total) by mouth 2 (two) times daily. 11/04/23   Constant, Peggy, MD  Omega-3 Fatty Acids (FISH OIL PO) Take by mouth.    [provider]  levonorgestrel (MIRENA) 20 MCG/24HR IUD 1 each by Intrauterine route once.  10/23/19  [provider]       Allergies    Patient has no known allergies.    Review of Systems   Review of Systems  Gastrointestinal:  Positive for abdominal pain.  All other systems reviewed and are negative.   Physical Exam Updated Vital Signs BP 129/83 (BP Location: Right Arm)   Pulse 100   Temp 98.2 F (36.8 C) (Oral)   Resp 20   Ht 5\' 4"  (1.626 m)   Wt 104.3 kg   LMP 10/28/2023 (Exact Date)   SpO2 100%   Breastfeeding No   BMI 39.48 kg/m  Physical Exam Vitals and nursing note reviewed.  Constitutional:      General: She is not in acute distress.    Appearance: Normal appearance. She is normal weight. She is not ill-appearing, toxic-appearing or diaphoretic.  HENT:     Head: Normocephalic and atraumatic.  Cardiovascular:     Rate and Rhythm: Normal rate.  Pulmonary:     Effort: Pulmonary effort is normal. No respiratory distress.  Abdominal:     General: Abdomen is flat.     Palpations: Abdomen is soft.     Tenderness: There is abdominal tenderness in the right lower quadrant. There is no guarding or rebound.  Musculoskeletal:        General: Normal range of motion.     Cervical back: Normal range of motion.  Skin:    General: Skin is warm and dry.  Neurological:  General: No focal deficit present.     Mental Status: She is alert.  Psychiatric:        Mood and Affect: Mood normal.        Behavior: Behavior normal.     ED Results / Procedures / Treatments   Labs (all labs ordered are listed, but only abnormal results are displayed) Labs Reviewed  COMPREHENSIVE METABOLIC PANEL - Abnormal; Notable for the following components:      Result Value   AST 11 (*)    All other components within normal limits  CBC - Abnormal; Notable for the following components:   Hemoglobin 11.5 (*)    HCT 35.2 (*)    All other components within normal limits  URINALYSIS, ROUTINE W REFLEX MICROSCOPIC - Abnormal; Notable for the following components:   Protein, ur TRACE (*)    All other  components within normal limits  LIPASE, BLOOD  PREGNANCY, URINE    EKG None  Radiology CT ABDOMEN PELVIS W CONTRAST Result Date: 11/18/2023 CLINICAL DATA:  Right lower quadrant abdominal pain. EXAM: CT ABDOMEN AND PELVIS WITH CONTRAST TECHNIQUE: Multidetector CT imaging of the abdomen and pelvis was performed using the standard protocol following bolus administration of intravenous contrast. RADIATION DOSE REDUCTION: This exam was performed according to the departmental dose-optimization program which includes automated exposure control, adjustment of the mA and/or kV according to patient size and/or use of iterative reconstruction technique. CONTRAST:  OMNIPAQUE IOHEXOL 300 MG/ML  SOLN COMPARISON:  None Available. FINDINGS: Lower chest: The lung bases are clear. No pleural effusion. The heart is normal in size. No pericardial effusion. Hepatobiliary: The liver is normal in size. Non-cirrhotic configuration. No suspicious mass. No intrahepatic or extrahepatic bile duct dilation. There is a single sub 5 mm gallstone without imaging signs of acute cholecystitis. Normal gallbladder wall thickness. No pericholecystic inflammatory changes. Pancreas: Unremarkable. No pancreatic ductal dilatation or surrounding inflammatory changes. Spleen: Within normal limits. No focal lesion. Adrenals/Urinary Tract: Adrenal glands are unremarkable. No suspicious renal mass. No hydronephrosis. No renal or ureteric calculi. Urinary bladder is under distended, precluding optimal assessment. However, no large mass or stones identified. No perivesical fat stranding. Stomach/Bowel: No disproportionate dilation of the small or large bowel loops. No evidence of abnormal bowel wall thickening or inflammatory changes. The appendix is unremarkable. There are scattered diverticula mainly in the sigmoid colon, without imaging signs of diverticulitis. Vascular/Lymphatic: No ascites or pneumoperitoneum. No abdominal or pelvic  lymphadenopathy, by size criteria. No aneurysmal dilation of the major abdominal arteries. Reproductive: The uterus is unremarkable. No large adnexal mass. Other: There is a tiny fat containing umbilical hernia. The soft tissues and abdominal wall are otherwise unremarkable. Musculoskeletal: No suspicious osseous lesions. IMPRESSION: *No acute inflammatory process identified within the abdomen or pelvis. Unremarkable appendix. *Multiple other nonacute observations, as described above. Electronically Signed   By: Jules Schick M.D.   On: 11/18/2023 16:21    Procedures Procedures    Medications Ordered in ED Medications  iohexol (OMNIPAQUE) 300 MG/ML solution 100 mL (100 mLs Intravenous Contrast Given 11/18/23 1603)    ED Course/ Medical Decision Making/ A&P                                 Medical Decision Making Amount and/or Complexity of Data Reviewed Labs: ordered. Radiology: ordered.   This patient is a 32 y.o. female who presents to the ED for concern of abdominal pain,  this involves an extensive number of treatment options, and is a complaint that carries with it a high risk of complications and morbidity. The emergent differential diagnosis prior to evaluation includes, but is not limited to, PID, ectopic pregnancy, appendicitis, kidney stone, dysmenorrhea, septic abortion, ruptured ovarian cyst, ovarian torsion, tubo-ovarian abscess, fibroids, endometriosis, diverticulitis, cystitis.   This is not an exhaustive differential.   Past Medical History / Co-morbidities / Social History:  has a past medical history of Chlamydia (2011), History of Papanicolaou smear of cervix, Hypertension, Low iron, Medical history non-contributory, and Sciatica.  Additional history: Chart reviewed.  Physical Exam: Physical exam performed. The pertinent findings include: Mild TTP RLQ without rebound or guarding.  Lab Tests: I ordered, and personally interpreted labs.  The pertinent results  include: No acute laboratory abnormalities   Imaging Studies: I ordered imaging studies including Ct abdomen pelvis. I independently visualized and interpreted imaging which showed   *No acute inflammatory process identified within the abdomen or pelvis. Unremarkable appendix. *Multiple other nonacute observations, as described above  I agree with the radiologist interpretation.   Medications: Offered pain medication which patient declined  Disposition: After consideration of the diagnostic results and the patients response to treatment, I feel that emergency department workup does not suggest an emergent condition requiring admission or immediate intervention beyond what has been performed at this time. The plan is: Discharge with close outpatient follow-up and return precautions. Patients work-up is benign. I did discuss the gallstone seen on her CT with her, she is aware of signs/symptoms of gallbladder symptom etiology. Her RUQ is nontender, likely incidental finding. Given lower abdominal tenderness in sexually active female, I did offer pelvic exam and wet prep/STD testing which patient declined. Her pain is very mild it seems, low suspicion for torsion. She feels well to return home and would prefer to follow-up outpatient. Evaluation and diagnostic testing in the emergency department does not suggest an emergent condition requiring admission or immediate intervention beyond what has been performed at this time.  Plan for discharge with close PCP follow-up.  Patient is understanding and amenable with plan, educated on red flag symptoms that would prompt immediate return.  Patient discharged in stable condition.  Final Clinical Impression(s) / ED Diagnoses Final diagnoses:  Right lower quadrant abdominal pain    Rx / DC Orders ED Discharge Orders     None     An After Visit Summary was printed and given to the patient.     Vear Clock 11/18/23 1728    Ernie Avena, MD 11/19/23 1245

## 2023-11-28 ENCOUNTER — Encounter: Payer: Self-pay | Admitting: Family Medicine

## 2023-11-28 ENCOUNTER — Ambulatory Visit (INDEPENDENT_AMBULATORY_CARE_PROVIDER_SITE_OTHER): Payer: Medicaid Other | Admitting: Family Medicine

## 2023-11-28 VITALS — BP 126/83 | HR 84 | Temp 97.5°F | Resp 18 | Ht 64.5 in | Wt 227.2 lb

## 2023-11-28 DIAGNOSIS — E7800 Pure hypercholesterolemia, unspecified: Secondary | ICD-10-CM

## 2023-11-28 DIAGNOSIS — D649 Anemia, unspecified: Secondary | ICD-10-CM

## 2023-11-28 DIAGNOSIS — N941 Unspecified dyspareunia: Secondary | ICD-10-CM

## 2023-11-28 DIAGNOSIS — Z8759 Personal history of other complications of pregnancy, childbirth and the puerperium: Secondary | ICD-10-CM

## 2023-11-28 DIAGNOSIS — M545 Low back pain, unspecified: Secondary | ICD-10-CM

## 2023-11-28 DIAGNOSIS — E559 Vitamin D deficiency, unspecified: Secondary | ICD-10-CM

## 2023-11-28 DIAGNOSIS — G8929 Other chronic pain: Secondary | ICD-10-CM

## 2023-11-28 DIAGNOSIS — N3946 Mixed incontinence: Secondary | ICD-10-CM

## 2023-11-28 NOTE — Patient Instructions (Addendum)
 Welcome to [facility]! It was a pleasure meeting you today.  As discussed, Please schedule a 1 month follow up visit today.   Sports Medicine at [facility]  [address] on the 1st floor Phone number [phone]   Schedule bathroom breaks.  Limit caffeine   PLEASE NOTE:  If you had any LAB tests please let us  know if you have not heard back within a few days. You may see your results on MyChart before we have a chance to review them but we will give you a call once they are reviewed by us . If we ordered any REFERRALS today, please let us  know if you have not heard from their office within the next week.  Let us  know through MyChart if you are needing REFILLS, or have your pharmacy send us  the request. You can also use MyChart to communicate with me or any office staff.  Please try these tips to maintain a healthy lifestyle:  Eat most of your calories during the day when you are active. Eliminate processed foods including packaged sweets (pies, cakes, cookies), reduce intake of potatoes, white bread, white pasta, and white rice. Look for whole grain options, oat flour or almond flour.  Each meal should contain half fruits/vegetables, one quarter protein, and one quarter carbs (no bigger than a computer mouse).  Cut down on sweet beverages. This includes juice, soda, and sweet tea. Also watch fruit intake, though this is a healthier sweet option, it still contains natural sugar! Limit to 3 servings daily.  Drink at least 1 glass of water  with each meal and aim for at least 8 glasses per day  Exercise at least 150 minutes every week.

## 2023-11-28 NOTE — Progress Notes (Signed)
 New Patient Office Visit  Subjective:  Patient ID: Paula [name], female    DOB: January 20, 1991  Age: 33 y.o. MRN: [id number]  CC:  Chief Complaint  Patient presents with   Establish Care    Initial visit to establish care with new pcp Discomfort with having sex, urine leakage, sciatic pain, has enlarged uterus    HPI Paula [name] presents for new pt  H/o preg induced HTN-no meds currently Chol elevated last yr-has made some changes Lumps in breast and  FH breast ca-mat cousin-?need for mamm Vit D def-daily supps Anemia-B12 and iron-takes daily- Wt-wegovy for 17 wks-slow wt loss. Exercises 2 x/wk and walks every other day.  Was going to [facility] clinic. Dysparunia-saw gyn-pap BV. And sch u/s for fibroids and told enlarged uterus. Sciatic pain-since pregnancy in 2023.  Mid back. When lying down, some numbness R leg. Did PT during pregnancy.  Incont urine-some urge, some dribble after using bathroom.  Occ drops intermitt. Going on since last c/s in 2023.  Caffeine  every other day. Has appt w/urogyn in March  Current Outpatient Medications:    cyanocobalamin  (VITAMIN B12) 1000 MCG tablet, Take 1 tablet (1,000 mcg total) by mouth daily., Disp: 30 tablet, Rfl: 2   ferrous sulfate  325 (65 FE) MG tablet, Take 325 mg by mouth daily with breakfast., Disp: , Rfl:    fluticasone  (FLONASE ) 50 MCG/ACT nasal spray, Place 2 sprays into both nostrils daily., Disp: 16 g, Rfl: 6   VITAMIN D  PO, Take 1 tablet by mouth daily., Disp: , Rfl:    WEGOVY 2.4 MG/0.75ML SOAJ, SMARTSIG:2.4 Milligram(s) SUB-Q Once a Week, Disp: , Rfl:   Past Medical History:  Diagnosis Date   Anemia    Chlamydia 2011   GERD (gastroesophageal reflux disease)    History of Papanicolaou smear of cervix    Hypertension    Gestational   Low iron    Medical history non-contributory    Sciatica     Past Surgical History:  Procedure Laterality Date   CESAREAN SECTION N/A 05/28/2016   Procedure:  CESAREAN SECTION;  Surgeon: Nena App, MD;  Location: [facility] BIRTHING SUITES;  Service: Obstetrics;  Laterality: N/A;   CESAREAN SECTION WITH BILATERAL TUBAL LIGATION Bilateral 03/08/2022   Procedure: CESAREAN SECTION WITH BILATERAL TUBAL LIGATION;  Surgeon: Ozan, Jennifer, DO;  Location: [facility] LD ORS;  Service: Obstetrics;  Laterality: Bilateral;   TUBAL LIGATION     Tubes removed   WISDOM TOOTH EXTRACTION      Family History  Problem Relation Age of Onset   Leukemia Mother    Cancer Mother 64       cml   Healthy Father    Hypertension Brother    Cancer Daughter 1       retinoblastoma   Hypertension Maternal Grandmother    Heart disease Maternal Grandmother    Diabetes Maternal Grandmother     Social History   Socioeconomic History   Marital status: Married    Spouse name: Not on file   Number of children: 2   Years of education: Not on file   Highest education level: Some college, no degree  Occupational History   Not on file  Tobacco Use   Smoking status: Never   Smokeless tobacco: Never  Vaping Use   Vaping status: Never Used  Substance and Sexual Activity   Alcohol use: Never   Drug use: Never   Sexual activity: Yes    Birth control/protection: Surgical, None  Comment: Tubal ligation  Other Topics Concern   Not on file  Social History Narrative   mom   Social Drivers of Health   Financial Resource Strain: Low Risk  (11/24/2023)   Overall Financial Resource Strain (CARDIA)    Difficulty of Paying Living Expenses: Not hard at all  Food Insecurity: No Food Insecurity (11/24/2023)   Hunger Vital Sign    Worried About Running Out of Food in the Last Year: Never true    Ran Out of Food in the Last Year: Never true  Transportation Needs: No Transportation Needs (11/24/2023)   PRAPARE - Administrator, Civil Service (Medical): No    Lack of Transportation (Non-Medical): No  Physical Activity: Insufficiently Active (11/24/2023)   Exercise Vital Sign     Days of Exercise per Week: 2 days    Minutes of Exercise per Session: 30 min  Stress: No Stress Concern Present (11/24/2023)   [employer] of Occupational Health - Occupational Stress Questionnaire    Feeling of Stress : Not at all  Social Connections: Moderately Integrated (11/24/2023)   Social Connection and Isolation Panel [NHANES]    Frequency of Communication with Friends and Family: More than three times a week    Frequency of Social Gatherings with Friends and Family: More than three times a week    Attends Religious Services: More than 4 times per year    Active Member of [employer] or Organizations: No    Attends Banker Meetings: Not on file    Marital Status: Married  Catering Manager Violence: Not At Risk (08/01/2023)   Received from [facility]   HITS    Over the last 12 months how often did your partner physically hurt you?: Never    Over the last 12 months how often did your partner insult you or talk down to you?: Never    Over the last 12 months how often did your partner threaten you with physical harm?: Never    Over the last 12 months how often did your partner scream or curse at you?: Never    ROS  ROS: Gen: no fever, chills  Skin: no rash, itching ENT: no ear pain, ear drainage, nasal congestion, rhinorrhea, sinus pressure, sore throat Eyes: no blurry vision, double vision Resp: no cough, wheeze,SOB CV: no CP, palpitations, LE edema,  GI: no heartburn, n/v/d/c, abd pain GU: HPI MSK: HPI Neuro: no dizziness,, weakness, vertigo.  Occ HA-annoying Psych: no depression, anxiety, insomnia, SI   Objective:   Today's Vitals: BP 126/83   Pulse 84   Temp (!) 97.5 F (36.4 C) (Temporal)   Resp 18   Ht 5' 4.5 (1.638 m)   Wt 227 lb 4 oz (103.1 kg)   LMP 10/28/2023 (Exact Date)   SpO2 99%   BMI 38.40 kg/m   Physical Exam  Gen: WDWN NAD HEENT: NCAT, conjunctiva not injected, sclera nonicteric NECK:  supple, no thyromegaly, no nodes, no  carotid bruits CARDIAC: RRR, S1S2+, no murmur. DP 2+B LUNGS: CTAB. No wheezes ABDOMEN:  BS+, soft, NTND, No HSM, no masses EXT:  no edema MSK: no gross abnormalities.  NEURO: A&O x3.  CN II-XII intact.  PSYCH: normal mood. Good eye contact   Reviewed u/s, ER reports, labs 05/15/23, gyn note  Assessment & Plan:  History of gestational hypertension Assessment & Plan: Chronic.  Off meds since 2023  working on diet/exercise/wt loss.  A little elevated  monitor.    Elevated cholesterol  Vitamin D  deficiency Assessment & Plan: Chronic.  On supps. Will check at [facility]   Anemia, unspecified type Assessment & Plan: Chronic.  Iron and B12.  Taking supps.  Will check at [facility]   Dyspareunia in female Assessment & Plan: Chronic.  CT neg.  Does have enlarged uterus.  Sees gyn.  Has appt w/urogyn in March   Mixed stress and urge urinary incontinence Assessment & Plan: Chronic.  Discussed scheduled bathroom breaks.  Declines pelvic floor PT for now.  Has appt w/urogyn in March   Chronic bilateral low back pain without sciatica Assessment & Plan: Chronic.  Suspect some pelvic floor dysfunction as well  did PT during pregnancy  declines PT now.  Stretches/strengthening  contact info for sports med given     Follow-up: Return in about 4 weeks (around 12/26/2023) for annual physical.   Jenkins CHRISTELLA Carrel, MD

## 2023-11-30 DIAGNOSIS — E559 Vitamin D deficiency, unspecified: Secondary | ICD-10-CM | POA: Insufficient documentation

## 2023-11-30 DIAGNOSIS — M545 Low back pain, unspecified: Secondary | ICD-10-CM | POA: Insufficient documentation

## 2023-11-30 DIAGNOSIS — N3946 Mixed incontinence: Secondary | ICD-10-CM | POA: Insufficient documentation

## 2023-11-30 DIAGNOSIS — D649 Anemia, unspecified: Secondary | ICD-10-CM | POA: Insufficient documentation

## 2023-11-30 DIAGNOSIS — N941 Unspecified dyspareunia: Secondary | ICD-10-CM | POA: Insufficient documentation

## 2023-11-30 NOTE — Assessment & Plan Note (Signed)
 Chronic.  Off meds since 2023  working on diet/exercise/wt loss.  A little elevated  monitor.

## 2023-11-30 NOTE — Assessment & Plan Note (Signed)
 Chronic.  Discussed scheduled bathroom breaks.  Declines pelvic floor PT for now.  Has appt w/urogyn in March

## 2023-11-30 NOTE — Assessment & Plan Note (Signed)
 Chronic.  CT neg.  Does have enlarged uterus.  Sees gyn.  Has appt w/urogyn in March

## 2023-11-30 NOTE — Assessment & Plan Note (Signed)
 Chronic.  Suspect some pelvic floor dysfunction as well  did PT during pregnancy  declines PT now.  Stretches/strengthening  contact info for sports med given

## 2023-11-30 NOTE — Assessment & Plan Note (Signed)
 Chronic.  Iron and B12.  Taking supps.  Will check at [facility]

## 2023-11-30 NOTE — Assessment & Plan Note (Signed)
 Chronic.  On supps. Will check at [facility]

## 2023-12-25 ENCOUNTER — Ambulatory Visit (INDEPENDENT_AMBULATORY_CARE_PROVIDER_SITE_OTHER): Payer: Medicaid Other | Admitting: Family Medicine

## 2023-12-25 ENCOUNTER — Encounter: Payer: Self-pay | Admitting: Family Medicine

## 2023-12-25 VITALS — BP 129/85 | HR 84 | Temp 97.6°F | Resp 18 | Ht 64.5 in | Wt 224.4 lb

## 2023-12-25 DIAGNOSIS — E559 Vitamin D deficiency, unspecified: Secondary | ICD-10-CM | POA: Diagnosis not present

## 2023-12-25 DIAGNOSIS — Z1159 Encounter for screening for other viral diseases: Secondary | ICD-10-CM

## 2023-12-25 DIAGNOSIS — D649 Anemia, unspecified: Secondary | ICD-10-CM | POA: Diagnosis not present

## 2023-12-25 DIAGNOSIS — Z0000 Encounter for general adult medical examination without abnormal findings: Secondary | ICD-10-CM | POA: Diagnosis not present

## 2023-12-25 LAB — COMPREHENSIVE METABOLIC PANEL
ALT: 9 U/L (ref 0–35)
AST: 13 U/L (ref 0–37)
Albumin: 4 g/dL (ref 3.5–5.2)
Alkaline Phosphatase: 64 U/L (ref 39–117)
BUN: 10 mg/dL (ref 6–23)
CO2: 27 meq/L (ref 19–32)
Calcium: 8.9 mg/dL (ref 8.4–10.5)
Chloride: 105 meq/L (ref 96–112)
Creatinine, Ser: 0.9 mg/dL (ref 0.40–1.20)
GFR: 83.08 mL/min (ref >60.00)
Glucose, Bld: 77 mg/dL (ref 70–99)
Potassium: 3.8 meq/L (ref 3.5–5.1)
Sodium: 139 meq/L (ref 135–145)
Total Bilirubin: 0.6 mg/dL (ref 0.2–1.2)
Total Protein: 7 g/dL (ref 6.0–8.3)

## 2023-12-25 LAB — LIPID PANEL
Cholesterol: 218 mg/dL — ABNORMAL HIGH (ref 0–200)
HDL: 45.1 mg/dL (ref >39.00)
LDL Cholesterol: 158 mg/dL — ABNORMAL HIGH (ref 0–99)
NonHDL: 173.12
Total CHOL/HDL Ratio: 5
Triglycerides: 74 mg/dL (ref 0.0–149.0)
VLDL: 14.8 mg/dL (ref 0.0–40.0)

## 2023-12-25 LAB — IBC + FERRITIN
Ferritin: 31.3 ng/mL (ref 10.0–291.0)
Iron: 55 ug/dL (ref 42–145)
Saturation Ratios: 17.9 % — ABNORMAL LOW (ref 20.0–50.0)
TIBC: 308 ug/dL (ref 250.0–450.0)
Transferrin: 220 mg/dL (ref 212.0–360.0)

## 2023-12-25 LAB — CBC WITH DIFFERENTIAL/PLATELET
Basophils Absolute: 0 10*3/uL (ref 0.0–0.1)
Basophils Relative: 0.6 % (ref 0.0–3.0)
Eosinophils Absolute: 0 10*3/uL (ref 0.0–0.7)
Eosinophils Relative: 0.4 % (ref 0.0–5.0)
HCT: 37.1 % (ref 36.0–46.0)
Hemoglobin: 12.2 g/dL (ref 12.0–15.0)
Lymphocytes Relative: 37.1 % (ref 12.0–46.0)
Lymphs Abs: 2 10*3/uL (ref 0.7–4.0)
MCHC: 32.9 g/dL (ref 30.0–36.0)
MCV: 90.4 fL (ref 78.0–100.0)
Monocytes Absolute: 0.4 10*3/uL (ref 0.1–1.0)
Monocytes Relative: 8 % (ref 3.0–12.0)
Neutro Abs: 2.8 10*3/uL (ref 1.4–7.7)
Neutrophils Relative %: 53.9 % (ref 43.0–77.0)
Platelets: 219 10*3/uL (ref 150.0–400.0)
RBC: 4.1 Mil/uL (ref 3.87–5.11)
RDW: 13.9 % (ref 11.5–15.5)
WBC: 5.3 10*3/uL (ref 4.0–10.5)

## 2023-12-25 LAB — HEMOGLOBIN A1C: Hgb A1c MFr Bld: 5.3 % (ref 4.6–6.5)

## 2023-12-25 LAB — TSH: TSH: 2.28 u[IU]/mL (ref 0.35–5.50)

## 2023-12-25 LAB — VITAMIN B12: Vitamin B-12: 239 pg/mL (ref 211–911)

## 2023-12-25 LAB — VITAMIN D 25 HYDROXY (VIT D DEFICIENCY, FRACTURES): VITD: 19.11 ng/mL — ABNORMAL LOW (ref 30.00–100.00)

## 2023-12-25 LAB — FOLATE: Folate: 18.2 ng/mL (ref >5.9)

## 2023-12-25 NOTE — Patient Instructions (Signed)

## 2023-12-25 NOTE — Progress Notes (Signed)
Phone [phone]   Subjective:   Patient is a 33 y.o. female presenting for annual physical.    Chief Complaint  Patient presents with   Annual Exam    CPE Fasting    Annual-exercising 3x/wk Vitamin D deficiency-1000 plus vit has 1000 in it. Anemia-B12 and iron.  Takes daily.  Heavy periods. Wt-on wegovy 2.4 mg weekly-doing well.    See problem oriented charting- ROS- ROS: Gen: no fever, chills  Skin: no rash, itching ENT: no ear pain, ear drainage, nasal congestion, rhinorrhea, sinus pressure, sore throat Eyes: no blurry vision, double vision Resp: no cough, wheeze,SOB CV: no CP, palpitations, LE edema,  GI: no heartburn, n/v/d/c, abd pain GU: some leaking and discomfort.  Seeing urogyn in March MSK: chronic  see 11/28/23 note Neuro: no dizziness, headache, weakness, vertigo Psych: no depression, anxiety, insomnia, SI   The following were reviewed and entered/updated in epic: Past Medical History:  Diagnosis Date   Anemia    Chlamydia 2011   GERD (gastroesophageal reflux disease)    History of Papanicolaou smear of cervix    Hypertension    Gestational   Low iron    Medical history non-contributory    Sciatica    Patient Active Problem List   Diagnosis Date Noted   Vitamin D deficiency 11/30/2023   Anemia 11/30/2023   Chronic bilateral low back pain without sciatica 11/30/2023   Mixed stress and urge urinary incontinence 11/30/2023   Dyspareunia in female 11/30/2023   Class 3 severe obesity due to excess calories without serious comorbidity with body mass index (BMI) of 40.0 to 44.9 in adult [facility]) 05/15/2023   Status post repeat low transverse cesarean section 03/08/2022   Status post bilateral salpingectomy 03/08/2022   History of gestational hypertension 03/02/2022   Breech presentation 02/22/2022   Supervision of high risk pregnancy, antepartum 12/19/2021   Carrier of spinal muscular atrophy 12/19/2021   History of cesarean delivery 05/29/2016   Past  Surgical History:  Procedure Laterality Date   CESAREAN SECTION N/A 05/28/2016   Procedure: CESAREAN SECTION;  Surgeon: Silverio Lay, MD;  Location: [facility] BIRTHING SUITES;  Service: Obstetrics;  Laterality: N/A;   CESAREAN SECTION WITH BILATERAL TUBAL LIGATION Bilateral 03/08/2022   Procedure: CESAREAN SECTION WITH BILATERAL TUBAL LIGATION;  Surgeon: Myna Hidalgo, DO;  Location: [facility] LD ORS;  Service: Obstetrics;  Laterality: Bilateral;   TUBAL LIGATION     Tubes removed   WISDOM TOOTH EXTRACTION      Family History  Problem Relation Age of Onset   Leukemia Mother    Cancer Mother 71       cml   Healthy Father    Hypertension Brother    Cancer Daughter 1       retinoblastoma   Hypertension Maternal Grandmother    Heart disease Maternal Grandmother    Diabetes Maternal Grandmother     Medications- reviewed and updated Current Outpatient Medications  Medication Sig Dispense Refill   cyanocobalamin (VITAMIN B12) 1000 MCG tablet Take 1 tablet (1,000 mcg total) by mouth daily. 30 tablet 2   ferrous sulfate 325 (65 FE) MG tablet Take 325 mg by mouth daily with breakfast.     fluticasone (FLONASE) 50 MCG/ACT nasal spray Place 2 sprays into both nostrils daily. (Patient taking differently: Place 2 sprays into both nostrils as needed.) 16 g 6   lactobacillus acidophilus (BACID) TABS tablet Take 2 tablets by mouth 3 (three) times daily.     VITAMIN D PO Take 1 tablet  by mouth daily.     WEGOVY 2.4 MG/0.75ML SOAJ SMARTSIG:2.4 Milligram(s) SUB-Q Once a Week     No current facility-administered medications for this visit.    Allergies-reviewed and updated No Known Allergies  Social History   Social History Narrative   mom   Objective  Objective:  BP 129/85   Pulse 84   Temp 97.6 F (36.4 C) (Temporal)   Resp 18   Ht 5' 4.5" (1.638 m)   Wt 224 lb 6 oz (101.8 kg)   LMP 12/02/2023 (Exact Date)   SpO2 98%   Breastfeeding No   BMI 37.92 kg/m  Physical Exam  Gen: WDWN  NAD HEENT: NCAT, conjunctiva not injected, sclera nonicteric TM WNL B, OP moist, no exudates  NECK:  supple, no thyromegaly, no nodes, no carotid bruits CARDIAC: RRR, S1S2+, no murmur. DP 2+B LUNGS: CTAB. No wheezes ABDOMEN:  BS+, soft, NTND, No HSM, no masses EXT:  no edema MSK: no gross abnormalities. MS 5/5 all 4 NEURO: A&O x3.  CN II-XII intact.  PSYCH: normal mood. Good eye contact      Assessment and Plan   Health Maintenance counseling: 1. Anticipatory guidance: Patient counseled regarding regular dental exams q6 months, eye exams,  avoiding smoking and second hand smoke, limiting alcohol to 1 beverage per day, no illicit drugs.   2. Risk factor reduction:  Advised patient of need for regular exercise and diet rich and fruits and vegetables to reduce risk of heart attack and stroke. Exercise- +.  Wt Readings from Last 3 Encounters:  12/25/23 224 lb 6 oz (101.8 kg)  11/28/23 227 lb 4 oz (103.1 kg)  11/18/23 230 lb (104.3 kg)   3. Immunizations/screenings/ancillary studies Immunization History  Administered Date(s) Administered   Adenovirus 08/15/2011   Dtap, Unspecified 04/07/1990, 10/06/1990, 04/08/1991, 10/14/1991, 09/18/1993   HIB, Unspecified 04/07/1990, 10/06/1990   HPV 9-valent 04/20/2014   Hep A / Hep B 08/15/2011   Hep B, Unspecified 08/27/2000, 09/29/2000, 03/03/2001   IPV 08/15/2011   Influenza Nasal 08/15/2011   MMR 04/07/1990, 09/18/1993   Meningococcal Conjugate 08/15/2011   Moderna Sars-Covid-2 Vaccination 03/10/2020, 04/07/2020   Polio, Unspecified 10/06/1990, 10/14/1991, 09/18/1993   Td 03/14/2004   Tdap 08/15/2011, 05/16/2016, 01/23/2022   Health Maintenance Due  Topic Date Due   Hepatitis C Screening  Never done    4. Cervical cancer screening: UTD 5. Skin cancer screening- advised regular sunscreen use. Denies worrisome, changing, or new skin lesions.  6. Birth control/STD check: TL 7. Smoking associated screening: non smoker 8. Alcohol  screening: neg  Wellness examination -     CBC with Differential/Platelet -     Comprehensive metabolic panel -     Lipid panel -     TSH -     Hemoglobin A1c -     Hepatitis C antibody -     Vitamin B12 -     VITAMIN D 25 Hydroxy (Vit-D Deficiency, Fractures) -     IBC + Ferritin -     Folate  Vitamin D deficiency -     VITAMIN D 25 Hydroxy (Vit-D Deficiency, Fractures)  Anemia, unspecified type -     CBC with Differential/Platelet -     Vitamin B12 -     IBC + Ferritin  Screening for viral disease -     Hepatitis C antibody   Wellness-anticipatory guidance.  Work on Diet/Exercise  Check CBC,CMP,lipids,TSH, A1C.  F/u 1 yr  Vit d deficiency-on 2000 iu/d.  Check  lab Anemia-heavy menses-will see urogyn in March. Check labs.  Continue iron and B12 daily  Recommended follow up: Return in about 6 months (around 06/23/2024) for chronic follow-up.  Lab/Order associations:+ fasting   Angelena Sole, MD

## 2023-12-26 LAB — HEPATITIS C ANTIBODY: Hepatitis C Ab: NONREACTIVE

## 2023-12-28 ENCOUNTER — Encounter: Payer: Self-pay | Admitting: Family Medicine

## 2023-12-28 NOTE — Progress Notes (Signed)
1.  Iron seems to be improving but losing blood from heavy periods so stores are still low.  Continue iron 2.  B12 still on the low side-increase B12 by 1000 mcg/d or consider monthly injections of B12 3.  D is still low-increase D by 1000iu/d 4. Your cholesterol levels are elevated.  Work on low cholesterol and lower carbs/sugars diet and  get exercise to try to lower your cholesterol.

## 2023-12-29 ENCOUNTER — Other Ambulatory Visit: Payer: Self-pay | Admitting: *Deleted

## 2023-12-29 DIAGNOSIS — R790 Abnormal level of blood mineral: Secondary | ICD-10-CM

## 2023-12-29 DIAGNOSIS — E559 Vitamin D deficiency, unspecified: Secondary | ICD-10-CM

## 2023-12-29 DIAGNOSIS — E538 Deficiency of other specified B group vitamins: Secondary | ICD-10-CM

## 2024-01-14 ENCOUNTER — Encounter (INDEPENDENT_AMBULATORY_CARE_PROVIDER_SITE_OTHER): Payer: Medicaid Other | Admitting: Family Medicine

## 2024-02-09 ENCOUNTER — Other Ambulatory Visit: Payer: Self-pay | Admitting: Obstetrics

## 2024-02-09 ENCOUNTER — Encounter: Payer: Self-pay | Admitting: Obstetrics

## 2024-02-09 ENCOUNTER — Ambulatory Visit (INDEPENDENT_AMBULATORY_CARE_PROVIDER_SITE_OTHER): Payer: Medicaid Other | Admitting: Obstetrics

## 2024-02-09 ENCOUNTER — Other Ambulatory Visit (HOSPITAL_COMMUNITY)
Admission: RE | Admit: 2024-02-09 | Discharge: 2024-02-09 | Disposition: A | Discharge status: Home / Self Care | Source: Ambulatory Visit | Attending: Obstetrics | Admitting: Obstetrics

## 2024-02-09 VITALS — BP 130/88 | HR 68 | Ht 64.5 in | Wt 220.0 lb

## 2024-02-09 DIAGNOSIS — R102 Pelvic and perineal pain: Secondary | ICD-10-CM | POA: Diagnosis present

## 2024-02-09 DIAGNOSIS — R351 Nocturia: Secondary | ICD-10-CM

## 2024-02-09 DIAGNOSIS — R339 Retention of urine, unspecified: Secondary | ICD-10-CM

## 2024-02-09 DIAGNOSIS — N3946 Mixed incontinence: Secondary | ICD-10-CM

## 2024-02-09 DIAGNOSIS — N941 Unspecified dyspareunia: Secondary | ICD-10-CM

## 2024-02-09 DIAGNOSIS — N8110 Cystocele, unspecified: Secondary | ICD-10-CM

## 2024-02-09 LAB — POCT URINALYSIS DIPSTICK
Blood, UA: NEGATIVE
Glucose, UA: NEGATIVE
Ketones, UA: NEGATIVE
Leukocytes, UA: NEGATIVE
Nitrite, UA: NEGATIVE
Protein, UA: POSITIVE — AB
Spec Grav, UA: >=1.03 — AB (ref 1.010–1.025)
Urobilinogen, UA: 0.2 U/dL
pH, UA: 5.5 (ref 5.0–8.0)

## 2024-02-09 MED ORDER — GEMTESA 75 MG PO TABS: 75.0000 mg | ORAL_TABLET | Freq: Every day | ORAL | 2 refills | Status: DC

## 2024-02-09 MED ORDER — LIDOCAINE 5 % EX OINT: TOPICAL_OINTMENT | CUTANEOUS | 0 refills | Status: DC

## 2024-02-09 MED ORDER — GEMTESA 75 MG PO TABS
75.0000 mg | ORAL_TABLET | Freq: Every day | ORAL | Status: DC
Start: 2024-02-09 — End: 2024-04-29

## 2024-02-09 NOTE — Assessment & Plan Note (Addendum)
 For night time frequency: - avoid fluid intake 3 hours before bedtime - reports snoring, consider workup for sleep apnea - trial of Gemtesa

## 2024-02-09 NOTE — Patient Instructions (Addendum)
 For treatment of stress urinary incontinence, which is leakage with physical activity/movement/strainging/coughing, we discussed expectant management versus nonsurgical options versus surgery. Nonsurgical options include weight loss, physical therapy, as well as a pessary.  Surgical options include a midurethral sling, which is a synthetic mesh sling that acts like a hammock under the urethra to prevent leakage of urine, a Burch urethropexy, and transurethral injection of a bulking agent.   We discussed the symptoms of overactive bladder (OAB), which include urinary urgency, urinary frequency, night-time urination, with or without urge incontinence.  We discussed management including behavioral therapy (decreasing bladder irritants by following a bladder diet, urge suppression strategies, timed voids, bladder retraining), physical therapy, medication; and for refractory cases posterior tibial nerve stimulation, sacral neuromodulation, and intravesical botulinum toxin injection.   For Beta-3 agonist medication, we discussed the potential side effect of elevated blood pressure which is more likely to occur in individuals with uncontrolled hypertension. You were given samples for Gemtesa 75 mg.  It can take a month to start working so give it time, but if you have bothersome side effects call sooner and we can try a different medication.  Call us if you have trouble filling the prescription or if it's not covered by your insurance.  For night time frequency: - avoid fluid intake 3 hours before bedtime - elevated your feet during the day or use compression socks to reduce lower extremity swelling - if you experience snoring, consider workup for sleep apnea  Continue diet modification and increase activity for weight loss along with [id number].   The origin of pelvic floor muscle spasm can be multifactorial, including primary, reactive to a different pain source, trauma, or even part of a centralized pain  syndrome.Treatment options include pelvic floor physical therapy, local (vaginal) or oral  muscle relaxants, pelvic muscle trigger point injections or centrally acting pain medications.     Constipation: Our goal is to achieve formed bowel movements daily or every-other-day.  You may need to try different combinations of the following options to find what works best for you - everybody's body works differently so feel free to adjust the dosages as needed.  Some options to help maintain bowel health include:  Dietary changes (more leafy greens, vegetables and fruits; less processed foods) Fiber supplementation (Benefiber, FiberCon, Metamucil or Psyllium). Start slow and increase gradually to full dose. Over-the-counter agents such as: stool softeners (Docusate or Colace) and/or laxatives (Miralax, milk of magnesia)  "Power Pudding" is a natural mixture that may help your constipation.  To make blend 1 cup applesauce, 1 cup wheat bran, and 3/4 cup prune juice, refrigerate and then take 1 tablespoon daily with a large glass of water as needed.  Women should try to eat at least 21 to 25 grams of fiber a day, while men should aim for 30 to 38 grams a day. You can add fiber to your diet with food or a fiber supplement such as psyllium (metamucil), benefiber, or fibercon.   Here's a look at how much dietary fiber is found in some common foods. When buying packaged foods, check the Nutrition Facts label for fiber content. It can vary among brands.  Fruits Serving size Total fiber (grams)*  Raspberries 1 cup 8.0  Pear 1 medium 5.5  Apple, with skin 1 medium 4.5  Banana 1 medium 3.0  Orange 1 medium 3.0  Strawberries 1 cup 3.0   Vegetables Serving size Total fiber (grams)*  Green peas, boiled 1 cup 9.0  Broccoli, boiled 1  cup chopped 5.0  Turnip greens, boiled 1 cup 5.0  Brussels sprouts, boiled 1 cup 4.0  Potato, with skin, baked 1 medium 4.0  Sweet corn, boiled 1 cup 3.5  Cauliflower, raw 1 cup  chopped 2.0  Carrot, raw 1 medium 1.5   Grains Serving size Total fiber (grams)*  Spaghetti, whole-wheat, cooked 1 cup 6.0  Barley, pearled, cooked 1 cup 6.0  Bran flakes 3/4 cup 5.5  Quinoa, cooked 1 cup 5.0  Oat bran muffin 1 medium 5.0  Oatmeal, instant, cooked 1 cup 5.0  Popcorn, air-popped 3 cups 3.5  Brown rice, cooked 1 cup 3.5  Bread, whole-wheat 1 slice 2.0  Bread, rye 1 slice 2.0   Legumes, nuts and seeds Serving size Total fiber (grams)*  Split peas, boiled 1 cup 16.0  Lentils, boiled 1 cup 15.5  Black beans, boiled 1 cup 15.0  Baked beans, canned 1 cup 10.0  Chia seeds 1 ounce 10.0  Almonds 1 ounce (23 nuts) 3.5  Pistachios 1 ounce (49 nuts) 3.0  Sunflower kernels 1 ounce 3.0  *Rounded to nearest 0.5 gram. Source: [employer] for [employer], Legacy Release  - provided handout regarding vulvodynia, reviewed comfort measures and treatment options.  - Rx topical lidocaine 1g PRN pain up to 3x/day - we can consider Amitriptyline 2.5%/ gabapentin 2.5%/ baclofen 2.5% in vaginal cream at compounding pharmacy for use daily if needed - discussed conservative management options with cold compress after intercourse  - lubrication use during intercourse - referral sent for pelvic floor PT appointment Please call [phone] to schedule the earliest appointment for pelvic floor PT.  - discussed proper vulvar care, warm compression, avoid pad use, cotton only underwear and barrier ointment if needed

## 2024-02-09 NOTE — Assessment & Plan Note (Signed)
-   bladder scan WNL - possible due to myofascial pelvic floor pain - referral to pelvic floor PT - reassess if refractory to treatment

## 2024-02-09 NOTE — Assessment & Plan Note (Addendum)
-   POCT UA + protein, PVR WNL - For treatment of stress urinary incontinence,  non-surgical options include expectant management, weight loss, physical therapy, as well as a pessary.  Surgical options include a midurethral sling, Burch urethropexy, and transurethral injection of a bulking agent. - We discussed the symptoms of overactive bladder (OAB), which include urinary urgency, urinary frequency, nocturia, with or without urge incontinence.  While we do not know the exact etiology of OAB, several treatment options exist. We discussed management including behavioral therapy (decreasing bladder irritants, urge suppression strategies, timed voids, bladder retraining), physical therapy, medication; for refractory cases posterior tibial nerve stimulation, sacral neuromodulation, and intravesical botulinum toxin injection.  For anticholinergic medications, we discussed the potential side effects of anticholinergics including dry eyes, dry mouth, constipation, cognitive impairment and urinary retention. For Beta-3 agonist medication, we discussed the potential side effect of elevated blood pressure which is more likely to occur in individuals with uncontrolled hypertension. - samples and Rx provided for Gemtesa - encouraged to continue weight reduction, on wegovy - reviewed behavioral modification with timed voids - encouraged optimization of stool consistency - referral to pelvic floor PT

## 2024-02-09 NOTE — Assessment & Plan Note (Signed)
 For treatment of pelvic organ prolapse, we discussed options for management including expectant management, conservative management, and surgical management, such as Kegels, a pessary, pelvic floor physical therapy, and specific surgical procedures.

## 2024-02-09 NOTE — Assessment & Plan Note (Addendum)
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   - sample of topical lidocaine lubrication provided - Rx for topical lidocaine use PRN pain - referral for pelvic floor PT - encouraged optimization of stool consistency and pelvic floor relaxation exercises

## 2024-02-09 NOTE — Progress Notes (Signed)
 New Patient Evaluation and Consultation  Referring Provider: Catalina Antigua, MD PCP: Jeani Sow, MD Date of Service: 02/09/2024  SUBJECTIVE Chief Complaint: New Patient (Initial Visit) (Paula [name] is a 33 y.o. female here today for urinary incontinence. )  History of Present Illness: Paula [name] is a 33 y.o. Black or African-American female seen in consultation at the request of Dr Jolayne Panther for evaluation of mixed urinary incontinence.    Started since 2nd pregnancy, did not resolve postpartum Concerned with urine odor on underwear Tried self directed pelvic floor exercises, PT during pregnancy without relief. Denies pregnancy postpartum Reports discomfort with intercourse since pregnancy.  Cramping with menses since bilateral salpingectomy.  Back pain since pregnancy, denies prior treatment  Review of records significant for: chronic back pain, dyspareunia, BMI on Wegovy with 16lb weight loss, iron  PROCEDURE: US PELVIS COMPLETE WITH TRANSVAGINAL   HISTORY: Patient is a 33 y/o F with enlarged uterus. LMP 10/28/2023. Tubal ligation.   COMPARISON: None.   TECHNIQUE: Two-dimensional transabdominal grayscale and color Doppler ultrasound of the pelvis was performed. Transvaginal was performed.   FINDINGS: The uterus is anteverted in position and is enlarged measuring 11.2x 4.3 x 4.6 cm. It demonstrates a normal, homogeneous echotexture. The endometrium measures 0.7 cm and demonstrates a normal homogeneous echotexture. Nabothian cysts are visualized within the cervix.   The right ovary measures 3.3 x 1.6 x 3.0 cm and demonstrates a normal echotexture. There is normal color Doppler flow.   The left ovary measures 3.2 x 1.4 x 2.4 cm and demonstrates a normal echotexture. There is normal color Doppler flow.   There is no fluid present within the cul-de-sac.   IMPRESSION: 1.  Enlarged uterus.  No fibroids.   Thank you for allowing [place] to assist in the  care of this patient.     Electronically Signed   By: Lestine Box M.D.   On: 11/12/2023 12:07  CLINICAL DATA:  Right lower quadrant abdominal pain.   EXAM: CT ABDOMEN AND PELVIS WITH CONTRAST   TECHNIQUE: Multidetector CT imaging of the abdomen and pelvis was performed using the standard protocol following bolus administration of intravenous contrast.   RADIATION DOSE REDUCTION: This exam was performed according to the departmental dose-optimization program which includes automated exposure control, adjustment of the mA and/or kV according to patient size and/or use of iterative reconstruction technique.   CONTRAST:  OMNIPAQUE IOHEXOL 300 MG/ML  SOLN   COMPARISON:  None Available.   FINDINGS: Lower chest: The lung bases are clear. No pleural effusion. The heart is normal in size. No pericardial effusion.   Hepatobiliary: The liver is normal in size. Non-cirrhotic configuration. No suspicious mass. No intrahepatic or extrahepatic bile duct dilation. There is a single sub 5 mm gallstone without imaging signs of acute cholecystitis. Normal gallbladder wall thickness. No pericholecystic inflammatory changes.   Pancreas: Unremarkable. No pancreatic ductal dilatation or surrounding inflammatory changes.   Spleen: Within normal limits. No focal lesion.   Adrenals/Urinary Tract: Adrenal glands are unremarkable. No suspicious renal mass. No hydronephrosis. No renal or ureteric calculi. Urinary bladder is under distended, precluding optimal assessment. However, no large mass or stones identified. No perivesical fat stranding.   Stomach/Bowel: No disproportionate dilation of the small or large bowel loops. No evidence of abnormal bowel wall thickening or inflammatory changes. The appendix is unremarkable. There are scattered diverticula mainly in the sigmoid colon, without imaging signs of diverticulitis.   Vascular/Lymphatic: No ascites or pneumoperitoneum. No abdominal or pelvic  lymphadenopathy, by size criteria. No aneurysmal dilation of the major abdominal arteries.   Reproductive: The uterus is unremarkable. No large adnexal mass.   Other: There is a tiny fat containing umbilical hernia. The soft tissues and abdominal wall are otherwise unremarkable.   Musculoskeletal: No suspicious osseous lesions.   IMPRESSION: *No acute inflammatory process identified within the abdomen or pelvis. Unremarkable appendix. *Multiple other nonacute observations, as described above.     Electronically Signed   By: Jules Schick M.D.   On: 11/18/2023 16:21  Urinary Symptoms: Leaks urine with with a full bladder, with movement to the bathroom, with urgency, and random Leaks 2-3 time(s) per days with urgency, most bothersome Leaks with coughing 1x/month Denies Pad use, managed with underwear changes 2x/day Patient is bothered by UI symptoms.  Day time voids 3-4.  Nocturia: 2 times per night to void since prior to pregnancy. Stops drinking water around 8pm, sleeps around 12pm Husband reports snoring, denies diagnosis of sleep apnea Denies leg swelling Voiding dysfunction:  does not empty bladder well. Feels that when she stands postvoid, more urine comes since pregnancy Patient does not use a catheter to empty bladder.  When urinating, patient feels dribbling after finishing Drinks: 32oz water per day, 1 cup of tea/1 can of soda every 2 days  UTIs:  0  UTI's in the last year.   Denies history of blood in urine, kidney or bladder stones, pyelonephritis, bladder cancer, and kidney cancer No results found for the last 90 days.   Pelvic Organ Prolapse Symptoms:                  Patient Denies a feeling of a bulge the vaginal area.  Bowel Symptom: Bowel movements: 1-2 time(s) per day, mostly Bristol I or III stool.  Stool consistency: hard Straining: no.  Splinting: no.  Incomplete evacuation: no.  Patient Denies accidental bowel leakage / fecal incontinence Bowel  regimen: none Last colonoscopy: not due for age based screening HM Colonoscopy   This patient has no relevant Health Maintenance data.     Sexual Function Sexually active: yes.  Sexual orientation: Straight Pain with sex: Yes, discomfort  with deep penetration. Denies relief with position  Pelvic Pain Denies pelvic pain  Past Medical History:  Past Medical History:  Diagnosis Date   Anemia    Chlamydia 2011   GERD (gastroesophageal reflux disease)    History of Papanicolaou smear of cervix    Hypertension    Gestational   Low iron    Medical history non-contributory    Sciatica      Past Surgical History:   Past Surgical History:  Procedure Laterality Date   CESAREAN SECTION N/A 05/28/2016   Procedure: CESAREAN SECTION;  Surgeon: Silverio Lay, MD;  Location: [facility] BIRTHING SUITES;  Service: Obstetrics;  Laterality: N/A;   CESAREAN SECTION WITH BILATERAL TUBAL LIGATION Bilateral 03/08/2022   Procedure: CESAREAN SECTION WITH BILATERAL TUBAL LIGATION;  Surgeon: Myna Hidalgo, DO;  Location: [facility] LD ORS;  Service: Obstetrics;  Laterality: Bilateral;   TUBAL LIGATION     Tubes removed   WISDOM TOOTH EXTRACTION       Past OB/GYN History: OB History  Gravida Para Term Preterm AB Living  3 2 1 1 1 2   SAB IAB Ectopic Multiple Live Births  1   0 2    # Outcome Date GA Lbr Len/2nd Weight Sex Type Anes PTL Lv  3 Preterm 03/08/22 [redacted]w[redacted]d  5 lb 7.8 oz (2.49 kg)  F CS-LTranv Spinal  LIV  2 Term 05/28/16 [redacted]w[redacted]d  5 lb 1.1 oz (2.3 kg) F CS-LTranv EPI  LIV  1 SAB             Obstetric Comments  "Chemical preg"  C/s: "placenta started to detach"    Vaginal deliveries: 0,  Forceps/ Vacuum deliveries: 0, Cesarean section: 2, 1st was emergency due to placental abruption. Largest infant 5lbs8oz Menopausal: No, LMP No LMP recorded. Contraception: bilateral salpingectomy. Last pap smear.  Any history of abnormal pap smears: no.    Component Value Date/Time   DIAGPAP (A) 11/03/2023  0933    - Atypical squamous cells of undetermined significance (ASC-US)   HPVHIGH Negative 11/03/2023 0933   ADEQPAP  11/03/2023 0933    Satisfactory for evaluation; transformation zone component PRESENT.    Medications: Patient has a current medication list which includes the following prescription(s): cyanocobalamin, ferrous sulfate, fluticasone, lidocaine, multivitamin with minerals, gemtesa, gemtesa, vitamin d, and wegovy.   Allergies: Patient has no known allergies.   Social History:  Social History   Tobacco Use   Smoking status: Never   Smokeless tobacco: Never  Vaping Use   Vaping status: Never Used  Substance Use Topics   Alcohol use: Never   Drug use: Never    Relationship status: married Patient lives with spouse and 2 children.   Patient is not employed. Regular exercise: Yes: walking, gym History of abuse: No  Family History:   Family History  Problem Relation Age of Onset   Leukemia Mother    Cancer Mother 68       cml   Healthy Father    Hypertension Brother    Hypertension Maternal Grandmother    Heart disease Maternal Grandmother    Diabetes Maternal Grandmother    Cancer Daughter 1       retinoblastoma   Bladder Cancer Neg Hx    Uterine cancer Neg Hx      Review of Systems: Review of Systems  Constitutional:  Negative for fever, malaise/fatigue and weight loss.  Respiratory:  Negative for cough, shortness of breath and wheezing.   Cardiovascular:  Negative for chest pain, palpitations and leg swelling.  Gastrointestinal:  Negative for abdominal pain, blood in stool and constipation.  Genitourinary:  Positive for frequency and urgency. Negative for dysuria and hematuria.       Leakage  Skin:  Negative for rash.  Neurological:  Negative for dizziness, weakness and headaches.  Endo/Heme/Allergies:  Does not bruise/bleed easily.  Psychiatric/Behavioral:  Negative for depression. The patient is not nervous/anxious.      OBJECTIVE Physical  Exam: There were no vitals filed for this visit.  Physical Exam Constitutional:      General: She is not in acute distress.    Appearance: Normal appearance.  Genitourinary:     Bladder and urethral meatus normal.     No lesions in the vagina.     Right Labia: No rash, tenderness, lesions, skin changes or Bartholin's cyst.    Left Labia: tenderness.     Left Labia: No lesions, skin changes, Bartholin's cyst or rash.       No vaginal discharge, erythema, tenderness, bleeding, ulceration or granulation tissue.     Anterior and posterior vaginal prolapse present.    No vaginal atrophy present.     Right Adnexa: not tender, not full and no mass present.    Left Adnexa: not tender, not full and no mass present.    No cervical motion  tenderness, discharge, friability, lesion, polyp or nabothian cyst.     Uterus is not enlarged, fixed, tender or irregular.     No uterine mass detected.    Urethral meatus caruncle not present.    No urethral prolapse, tenderness, mass, hypermobility, discharge or stress urinary incontinence with cough stress test present.     Bladder is not tender, urgency on palpation not present and masses not present.      Pelvic Floor: Levator muscle strength is 2/5.    Levator ani is tender (bilaterally) and obturator internus is tender.     No asymmetrical contractions present and no pelvic spasms present.    Symmetrical pelvic sensation, anal wink present and BC reflex present. Cardiovascular:     Rate and Rhythm: Normal rate.  Pulmonary:     Effort: Pulmonary effort is normal. No respiratory distress.  Abdominal:     General: There is no distension.     Palpations: Abdomen is soft. There is no mass.     Tenderness: There is no abdominal tenderness.     Hernia: No hernia is present.    Musculoskeletal:       Legs:  Neurological:     Mental Status: She is alert.  Vitals reviewed. Exam conducted with a chaperone present.      POP-Q:   POP-Q  -1                                             Aa   -1                                           Ba  -6                                              C   2                                            Gh  3                                            Pb  10                                            tvl   -2                                            Ap  -2  Bp  -8                                              D    Post-Void Residual (PVR) by Bladder Scan: In order to evaluate bladder emptying, we discussed obtaining a postvoid residual and patient agreed to this procedure.  Procedure: The ultrasound unit was placed on the patient's abdomen in the suprapubic region after the patient had voided.    Post Void Residual - 02/09/24 0845       Post Void Residual   Post Void Residual 2 mL              Laboratory Results: Lab Results  Component Value Date   COLORU Yellow 02/09/2024   CLARITYU Clear 02/09/2024   GLUCOSEUR Negative 02/09/2024   BILIRUBINUR Small 02/09/2024   KETONESU Negative 02/09/2024   SPECGRAV >=1.030 (A) 02/09/2024   RBCUR Negative 02/09/2024   PHUR 5.5 02/09/2024   PROTEINUR Positive (A) 02/09/2024   UROBILINOGEN 0.2 02/09/2024   LEUKOCYTESUR Negative 02/09/2024    Lab Results  Component Value Date   CREATININE 0.90 12/25/2023   CREATININE 0.81 11/18/2023   CREATININE 0.99 05/15/2023    Lab Results  Component Value Date   HGBA1C 5.3 12/25/2023    Lab Results  Component Value Date   HGB 12.2 12/25/2023     ASSESSMENT AND PLAN Ms. Redmon is a 33 y.o. with:  1. Mixed stress and urge urinary incontinence   2. Dyspareunia in female   3. Nocturia   4. Incomplete bladder emptying   5. Vulvar pain   6. Pelvic organ prolapse quantification stage 2 cystocele     Mixed stress and urge urinary incontinence Assessment & Plan: - POCT UA + protein, PVR WNL - For treatment of stress urinary  incontinence,  non-surgical options include expectant management, weight loss, physical therapy, as well as a pessary.  Surgical options include a midurethral sling, Burch urethropexy, and transurethral injection of a bulking agent. - We discussed the symptoms of overactive bladder (OAB), which include urinary urgency, urinary frequency, nocturia, with or without urge incontinence.  While we do not know the exact etiology of OAB, several treatment options exist. We discussed management including behavioral therapy (decreasing bladder irritants, urge suppression strategies, timed voids, bladder retraining), physical therapy, medication; for refractory cases posterior tibial nerve stimulation, sacral neuromodulation, and intravesical botulinum toxin injection.  For anticholinergic medications, we discussed the potential side effects of anticholinergics including dry eyes, dry mouth, constipation, cognitive impairment and urinary retention. For Beta-3 agonist medication, we discussed the potential side effect of elevated blood pressure which is more likely to occur in individuals with uncontrolled hypertension. - samples and Rx provided for Gemtesa - encouraged to continue weight reduction, on wegovy - reviewed behavioral modification with timed voids - encouraged optimization of stool consistency - referral to pelvic floor PT  Orders: -     AMB referral to rehabilitation -     Gemtesa; Take 1 tablet (75 mg total) by mouth daily.  Dispense: 30 tablet; Refill: 2 -     Gemtesa; Take 1 tablet (75 mg total) by mouth daily.  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   - sample of topical lidocaine lubrication provided - Rx for topical lidocaine use PRN pain - referral for pelvic floor PT - encouraged optimization of stool consistency and pelvic floor relaxation exercises  Orders: -     AMB referral to rehabilitation  Nocturia Assessment & Plan: For night time frequency: - avoid fluid intake 3 hours before bedtime - reports snoring, consider workup for sleep apnea - trial of Gemtesa  Orders: -     POCT urinalysis dipstick -     AMB referral to rehabilitation  Incomplete bladder emptying Assessment & Plan: - bladder scan WNL - possible due to myofascial pelvic floor pain - referral to pelvic floor PT - reassess if refractory to treatment  Orders: -     AMB referral to rehabilitation  Vulvar pain Assessment & Plan: - provided handout regarding vulvodynia, reviewed comfort measures and treatment options.  - Rx topical lidocaine 1g PRN pain up to 3x/day - consider Rx Amitriptyline 2.5%/ gabapentin 2.5%/ baclofen 2.5% in vaginal cream. Will be sent to compounding pharmacy for use daily - discussed conservative management options with cold compress after intercourse  - lubrication use during intercourse - pending pelvic floor PT appointment - Nuswab to r/o infectious etiology  Orders: -     Cervicovaginal ancillary only -     AMB referral to rehabilitation -     Lidocaine; Use 0.5g peasize over vulva up to 3 times a day  Dispense: 35.44 g; Refill: 0  Pelvic organ prolapse quantification stage 2 cystocele Assessment & Plan: For treatment of pelvic organ prolapse, we discussed options for management including expectant management, conservative management, and surgical management, such as Kegels, a pessary, pelvic floor physical therapy, and specific surgical procedures.    Time  spent: I spent 64 minutes dedicated to the care of this patient on the date of this encounter to include pre-visit review of records, face-to-face time with the patient discussing vulvar pain, feeling of incomplete bladder emptying, mixed urinary incontinence, nocturia, stage I pelvic organ prolapse, dyspareunia, and post visit documentation and ordering medication/ testing.  Follow-up pending with Dr. Jolayne Panther regarding enlarged uterus on imaging.   Paula Chance, MD

## 2024-02-09 NOTE — Assessment & Plan Note (Addendum)
-   provided handout regarding vulvodynia, reviewed comfort measures and treatment options.  - Rx topical lidocaine 1g PRN pain up to 3x/day - consider Rx Amitriptyline 2.5%/ gabapentin 2.5%/ baclofen 2.5% in vaginal cream. Will be sent to compounding pharmacy for use daily - discussed conservative management options with cold compress after intercourse  - lubrication use during intercourse - pending pelvic floor PT appointment - Nuswab to r/o infectious etiology

## 2024-02-10 ENCOUNTER — Encounter: Payer: Self-pay | Admitting: Obstetrics

## 2024-02-10 LAB — CERVICOVAGINAL ANCILLARY ONLY
Bacterial Vaginitis (gardnerella): POSITIVE — AB
Candida Glabrata: NEGATIVE
Candida Vaginitis: NEGATIVE
Comment: NEGATIVE
Comment: NEGATIVE
Comment: NEGATIVE

## 2024-02-10 MED ORDER — METRONIDAZOLE 500 MG PO TABS: 500.0000 mg | ORAL_TABLET | Freq: Two times a day (BID) | ORAL | 0 refills | Status: AC

## 2024-02-10 NOTE — Addendum Note (Signed)
 Addended byWyatt Haste T on: 02/10/2024 01:06 PM   Modules accepted: Orders

## 2024-04-06 ENCOUNTER — Ambulatory Visit

## 2024-04-29 ENCOUNTER — Ambulatory Visit: Admitting: Family Medicine

## 2024-04-29 ENCOUNTER — Encounter: Payer: Self-pay | Admitting: Family Medicine

## 2024-04-29 ENCOUNTER — Ambulatory Visit

## 2024-04-29 VITALS — BP 132/85 | HR 77 | Temp 97.4°F | Resp 18 | Ht 64.5 in | Wt 220.0 lb

## 2024-04-29 DIAGNOSIS — G8929 Other chronic pain: Secondary | ICD-10-CM

## 2024-04-29 DIAGNOSIS — M533 Sacrococcygeal disorders, not elsewhere classified: Secondary | ICD-10-CM

## 2024-04-29 DIAGNOSIS — M545 Low back pain, unspecified: Secondary | ICD-10-CM | POA: Diagnosis not present

## 2024-04-29 NOTE — Progress Notes (Signed)
 Subjective:     Patient ID: Paula [name], female    DOB: Jul 04, 1991, 33 y.o.   MRN: [id number]  Chief Complaint  Patient presents with   Back Pain    Still having mid to lower back pain, back feels more stiffer than usual, unable to hold daughter for no longer than 10 minutes    HPI Discussed the use of AI scribe software for clinical note transcription with the patient, who gave verbal consent to proceed.  History of Present Illness Britne Marrianne Sica is a 33 year old female who presents with persistent lower back pain.  She experiences persistent lower back pain, sometimes radiating to the middle of her back. The pain is exacerbated by holding her daughter for extended periods, leading to stiffness, but improves when she puts her daughter down. It is not associated with walking, getting up from the floor, or turning over in bed. The pain is primarily located in the middle of her back and sometimes feels random in occurrence.  During pregnancy, did have some pelvic PT.  She has tried using a heating pad and performing stretches, but these have not provided significant relief. She has not used ibuprofen , Tylenol , or undergone physical therapy for this issue. The pain began during her pregnancy in 2023, and she no longer experiences numbness in her right leg, which was present initially. The pain does not radiate down her legs, and she does not experience leg weakness or heaviness.  A CT scan of the abdomen and pelvis was performed on November 18, 2023; she recalls that the focus was not on her back. She has not had any x-rays of the back area. No recent falls or injuries to the area.    There are no preventive care reminders to display for this patient.  Past Medical History:  Diagnosis Date   Anemia    Chlamydia 2011   GERD (gastroesophageal reflux disease)    History of Papanicolaou smear of cervix    Hypertension    Gestational   Low iron    Medical history  non-contributory    Sciatica     Past Surgical History:  Procedure Laterality Date   CESAREAN SECTION N/A 05/28/2016   Procedure: CESAREAN SECTION;  Surgeon: Ona Bidding, MD;  Location: [facility] BIRTHING SUITES;  Service: Obstetrics;  Laterality: N/A;   CESAREAN SECTION WITH BILATERAL TUBAL LIGATION Bilateral 03/08/2022   Procedure: CESAREAN SECTION WITH BILATERAL TUBAL LIGATION;  Surgeon: Ozan, Jennifer, DO;  Location: [facility] LD ORS;  Service: Obstetrics;  Laterality: Bilateral;   TUBAL LIGATION     Tubes removed   WISDOM TOOTH EXTRACTION       Current Outpatient Medications:    cyanocobalamin  (VITAMIN B12) 1000 MCG tablet, Take 1 tablet (1,000 mcg total) by mouth daily., Disp: 30 tablet, Rfl: 2   ferrous sulfate  325 (65 FE) MG tablet, Take 325 mg by mouth daily with breakfast., Disp: , Rfl:    fluticasone  (FLONASE ) 50 MCG/ACT nasal spray, Place 2 sprays into both nostrils daily. (Patient taking differently: Place 2 sprays into both nostrils as needed.), Disp: 16 g, Rfl: 6   lidocaine  (XYLOCAINE ) 5 % ointment, Use 0.5g peasize over vulva up to 3 times a day, Disp: 35.44 g, Rfl: 0   Multiple Vitamin (MULTIVITAMIN WITH MINERALS) TABS tablet, Take 1 tablet by mouth daily., Disp: , Rfl:    Vibegron  (GEMTESA ) 75 MG TABS, Take 1 tablet (75 mg total) by mouth daily., Disp: 30 tablet, Rfl: 2  VITAMIN D  PO, Take 1 tablet by mouth daily., Disp: , Rfl:    WEGOVY 2.4 MG/0.75ML SOAJ, SMARTSIG:2.4 Milligram(s) SUB-Q Once a Week, Disp: , Rfl:   No Known Allergies ROS neg/noncontributory except as noted HPI/below      Objective:      BP 132/85   Pulse 77   Temp (!) 97.4 F (36.3 C) (Temporal)   Resp 18   Ht 5' 4.5" (1.638 m)   Wt 220 lb (99.8 kg)   LMP 03/28/2024 (Exact Date)   SpO2 98%   BMI 37.18 kg/m  Wt Readings from Last 3 Encounters:  04/29/24 220 lb (99.8 kg)  02/09/24 220 lb (99.8 kg)  12/25/23 224 lb 6 oz (101.8 kg)    Physical Exam   Gen: WDWN NAD HEENT: NCAT, conjunctiva  not injected, sclera nonicteric ABDOMEN:  BS+, soft, NTND, No HSM, no masses EXT:  no edema MSK: no gross abnormalities.  NEURO: A&O x3.  CN II-XII intact.  PSYCH: normal mood. Good eye contact  Back:  can stand on heels/toes/1 leg.  No TTP. No SI tenderness. No rash.  MS 5/5 BLE.  DTR 2+ BLE.  SLR neg B.  Good ROM   no pain hips     Assessment & Plan:  Chronic bilateral low back pain without sciatica -     DG Lumbar Spine 2-3 Views; Future -     DG Sacrum/Coccyx; Future -     Ambulatory referral to Physical Therapy  Coccygeal pain -     DG Lumbar Spine 2-3 Views; Future -     DG Sacrum/Coccyx; Future -     Ambulatory referral to Physical Therapy  Assessment and Plan Assessment & Plan Chronic Lower Back Pain   Chronic lower back pain is primarily in the middle and lower back, worsened by holding her daughter for long periods. The pain is characterized by stiffness and tightness without leg radiation or numbness. It began during pregnancy in 2023 and was initially thought to be due to bladder spasms, but current assessment indicates musculoskeletal issues. A previous CT scan showed no significant findings, and there is no trauma history. An x-ray of the coccyx and lower spine is planned to check for structural abnormalities, though significant findings are not anticipated. Physical therapy focusing on the coccyx and lower back is recommended to alleviate symptoms. If symptoms worsen or do not improve, evaluation by sports medicine specialists may be considered. She should use ibuprofen , Advil , Aleve, heat, and possibly ice for pain management. A follow-up appointment is scheduled for July 30th to reassess back pain and response to physical therapy.  Gallstone   A gallstone was identified on a previous CT scan of the abdomen and pelvis, but there are no current symptoms or complications.    Return if symptoms worsen or fail to improve, for as sch in July.  Ellsworth Haas, MD

## 2024-04-29 NOTE — Progress Notes (Deleted)
 [facility] Return Visit  SUBJECTIVE  History of Present Illness: Paula [name] is a 33 y.o. female seen in follow-up for stage II pelvic organ prolapse, mixed urinary incontinence, dyspareunia, nocturia, incomplete bladder emptying, and vulvar pain. Plan at last visit was trial of Gemtesa , continue weight reduction, timed voids, referral to pelvic floor PT, topical lidocaine , fiber supplementation, flagyl  for BV.   ***sleep study   Past Medical History: Patient  has a past medical history of Anemia, Chlamydia (2011), GERD (gastroesophageal reflux disease), History of Papanicolaou smear of cervix, Hypertension, Low iron, Medical history non-contributory, and Sciatica.   Past Surgical History: She  has a past surgical history that includes Wisdom tooth extraction; Cesarean section (N/A, 05/28/2016); Cesarean section with bilateral tubal ligation (Bilateral, 03/08/2022); and Tubal ligation.   Medications: She has a current medication list which includes the following prescription(s): cyanocobalamin , ferrous sulfate , fluticasone , lidocaine , multivitamin with minerals, gemtesa , vitamin d , and wegovy.   Allergies: Patient has no known allergies.   Social History: Patient  reports that she has never smoked. She has never used smokeless tobacco. She reports that she does not drink alcohol and does not use drugs.     OBJECTIVE     Physical Exam: There were no vitals filed for this visit. Gen: No apparent distress, A&O x 3.  Detailed Urogynecologic Evaluation:  Deferred. Prior exam showed:      No data to display             ASSESSMENT AND PLAN    Paula [name] is a 33 y.o. with:  No diagnosis found.  There are no diagnoses linked to this encounter.   Darlene Ehlers, MD

## 2024-04-29 NOTE — Patient Instructions (Addendum)
 [facility]  [address] on the 1st floor Phone number [phone]   Can take tylneol and motrin .    Referral to physical therapy

## 2024-04-30 ENCOUNTER — Ambulatory Visit: Admitting: Obstetrics

## 2024-05-01 ENCOUNTER — Ambulatory Visit: Payer: Self-pay | Admitting: Family Medicine

## 2024-05-01 NOTE — Progress Notes (Signed)
 Just some minor narrowing and arthritis.  Will see how PT does.

## 2024-05-06 NOTE — Progress Notes (Signed)
 [facility] Return Visit  SUBJECTIVE  History of Present Illness: Paula [name] is a 33 y.o. female seen in follow-up for mixed urinary incontinence, dyspareunia, nocturia, incomplete bladder emptying, vulvar pain, and stage II pelvic organ prolapse. Plan at last visit was trial of Gemtesa , weight reduction on Wegovy, timed voids, referral to PT, topical lidocaine .   Pending to start PT 05/17/24 Did not try Gemtesa  or topical lidocaine , prefers to avoid medications Leaks 2-3 time(s) per days with urgency, most bothersome Leaks with coughing 1x/month Timed voids with some relief, continues to report dampness Prior Rx flagyl  for bacterial vaginosis with resolution of discharge. Started probiotics with relief. Cycle control due to association after cycles, denies history of continuous OCPs and used placebo pills at the end of the month. Reduction of night time frequency to 1x/night. Denies vaginal symptoms today.   Past Medical History: Patient  has a past medical history of Anemia, Chlamydia (2011), GERD (gastroesophageal reflux disease), History of Papanicolaou smear of cervix, Hypertension, Low iron, Medical history non-contributory, and Sciatica.   Past Surgical History: She  has a past surgical history that includes Wisdom tooth extraction; Cesarean section (N/A, 05/28/2016); Cesarean section with bilateral tubal ligation (Bilateral, 03/08/2022); and Tubal ligation.   Medications: She has a current medication list which includes the following prescription(s): phenazopyridine, cyanocobalamin , ferrous sulfate , fluticasone , lidocaine , multivitamin with minerals, gemtesa , vitamin d , and wegovy.   Allergies: Patient has no known allergies.   Social History: Patient  reports that she has never smoked. She has never used smokeless tobacco. She reports that she does not drink alcohol and does not use drugs.     OBJECTIVE     Physical Exam: Vitals:   05/07/24 1447   BP: (!) 145/93  Pulse: 71   Gen: No apparent distress, A&O x 3.  Detailed Urogynecologic Evaluation:  Deferred.      ASSESSMENT AND PLAN    [name] is a 33 y.o. with:  1. Mixed stress and urge urinary incontinence   2. Vulvar pain   3. Pelvic organ prolapse quantification stage 2 cystocele   4. Nocturia     Mixed stress and urge urinary incontinence Assessment & Plan: - Rx pyridium to assist with objective assessment of frequency and volume of leakage - 02/09/24 POCT UA + protein, PVR WNL - For treatment of stress urinary incontinence,  non-surgical options include expectant management, weight loss, physical therapy, as well as a pessary.  Surgical options include a midurethral sling, Burch urethropexy, and transurethral injection of a bulking agent. - We discussed the symptoms of overactive bladder (OAB), which include urinary urgency, urinary frequency, nocturia, with or without urge incontinence.  While we do not know the exact etiology of OAB, several treatment options exist. We discussed management including behavioral therapy (decreasing bladder irritants, urge suppression strategies, timed voids, bladder retraining), physical therapy, medication; for refractory cases posterior tibial nerve stimulation, sacral neuromodulation, and intravesical botulinum toxin injection.  For anticholinergic medications, we discussed the potential side effects of anticholinergics including dry eyes, dry mouth, constipation, cognitive impairment and urinary retention. For Beta-3 agonist medication, we discussed the potential side effect of elevated blood pressure which is more likely to occur in individuals with uncontrolled hypertension. - samples and Rx provided for Gemtesa , pt desires to avoid use - encouraged to continue weight reduction, on wegovy - reviewed behavioral modification with timed voids - encouraged optimization of stool consistency - pending to start PT, encouraged to consider  referral to pelvic floor PT  Orders: -     Phenazopyridine HCl; Take 1 tablet (200 mg total) by mouth 3 (three) times daily as needed for pain.  Dispense: 10 tablet; Refill: 0  Vulvar pain Assessment & Plan: - previously provided handout regarding vulvodynia, reviewed comfort measures and treatment options.  - Rx topical lidocaine  1g PRN pain up to 3x/day, encouraged pt to use if needed  - consider Rx Amitriptyline 2.5%/ gabapentin 2.5%/ baclofen 2.5% in vaginal cream. Will be sent to compounding pharmacy for use daily - discussed conservative management options with cold compress after intercourse  - lubrication use during intercourse - pending to start PT for back pain, encouraged to consider pelvic floor PT if refractory symptoms after PT - Rx flagyl  for BV due to + Nuswab 02/09/24. Denies vaginal symptoms today - continue probiotics, encouraged to review continuous OCP options with Dr. Dodie Frees due to vaginal discharge after cycles   Pelvic organ prolapse quantification stage 2 cystocele Assessment & Plan: - For treatment of pelvic organ prolapse, we discussed options for management including expectant management, conservative management, and surgical management, such as Kegels, a pessary, pelvic floor physical therapy, and specific surgical procedures. - encouraged to consider pelvic floor PT after PT for back pain - discussed possible pessary use to assess association of bulge with urinary symptoms   Nocturia Assessment & Plan: - reduced to 1x/night - continue to avoid fluid intake 3 hours before bedtime - reports snoring, consider workup for sleep apnea - consider trial of Gemtesa    Time spent: I spent 29 minutes dedicated to the care of this patient on the date of this encounter to include pre-visit review of records, face-to-face time with the patient discussing mixed urinary incontinence, stage II pelvic organ prolapse, nocturia, vulvar pain, and post visit documentation and  ordering medication.   Darlene Ehlers, MD

## 2024-05-07 ENCOUNTER — Encounter: Payer: Self-pay | Admitting: Obstetrics

## 2024-05-07 ENCOUNTER — Ambulatory Visit (INDEPENDENT_AMBULATORY_CARE_PROVIDER_SITE_OTHER): Admitting: Obstetrics

## 2024-05-07 VITALS — BP 145/93 | HR 71

## 2024-05-07 DIAGNOSIS — N8110 Cystocele, unspecified: Secondary | ICD-10-CM | POA: Diagnosis not present

## 2024-05-07 DIAGNOSIS — R102 Pelvic and perineal pain: Secondary | ICD-10-CM | POA: Diagnosis not present

## 2024-05-07 DIAGNOSIS — R351 Nocturia: Secondary | ICD-10-CM | POA: Diagnosis not present

## 2024-05-07 DIAGNOSIS — N3946 Mixed incontinence: Secondary | ICD-10-CM

## 2024-05-07 MED ORDER — PHENAZOPYRIDINE HCL 200 MG PO TABS: 200.0000 mg | ORAL_TABLET | Freq: Three times a day (TID) | ORAL | 0 refills | Status: DC | PRN

## 2024-05-07 NOTE — Assessment & Plan Note (Signed)
-   Rx pyridium to assist with objective assessment of frequency and volume of leakage - 02/09/24 POCT UA + protein, PVR WNL - For treatment of stress urinary incontinence,  non-surgical options include expectant management, weight loss, physical therapy, as well as a pessary.  Surgical options include a midurethral sling, Burch urethropexy, and transurethral injection of a bulking agent. - We discussed the symptoms of overactive bladder (OAB), which include urinary urgency, urinary frequency, nocturia, with or without urge incontinence.  While we do not know the exact etiology of OAB, several treatment options exist. We discussed management including behavioral therapy (decreasing bladder irritants, urge suppression strategies, timed voids, bladder retraining), physical therapy, medication; for refractory cases posterior tibial nerve stimulation, sacral neuromodulation, and intravesical botulinum toxin injection.  For anticholinergic medications, we discussed the potential side effects of anticholinergics including dry eyes, dry mouth, constipation, cognitive impairment and urinary retention. For Beta-3 agonist medication, we discussed the potential side effect of elevated blood pressure which is more likely to occur in individuals with uncontrolled hypertension. - samples and Rx provided for Gemtesa , pt desires to avoid use - encouraged to continue weight reduction, on wegovy - reviewed behavioral modification with timed voids - encouraged optimization of stool consistency - pending to start PT, encouraged to consider referral to pelvic floor PT

## 2024-05-07 NOTE — Assessment & Plan Note (Addendum)
-   previously provided handout regarding vulvodynia, reviewed comfort measures and treatment options.  - Rx topical lidocaine  1g PRN pain up to 3x/day, encouraged pt to use if needed  - consider Rx Amitriptyline 2.5%/ gabapentin 2.5%/ baclofen 2.5% in vaginal cream. Will be sent to compounding pharmacy for use daily - discussed conservative management options with cold compress after intercourse  - lubrication use during intercourse - pending to start PT for back pain, encouraged to consider pelvic floor PT if refractory symptoms after PT - Rx flagyl  for BV due to + Nuswab 02/09/24. Denies vaginal symptoms today - continue probiotics, encouraged to review continuous OCP options with Dr. Dodie Frees due to vaginal discharge after cycles

## 2024-05-07 NOTE — Assessment & Plan Note (Signed)
-   For treatment of pelvic organ prolapse, we discussed options for management including expectant management, conservative management, and surgical management, such as Kegels, a pessary, pelvic floor physical therapy, and specific surgical procedures. - encouraged to consider pelvic floor PT after PT for back pain - discussed possible pessary use to assess association of bulge with urinary symptoms

## 2024-05-07 NOTE — Assessment & Plan Note (Signed)
-   reduced to 1x/night - continue to avoid fluid intake 3 hours before bedtime - reports snoring, consider workup for sleep apnea - consider trial of Gemtesa

## 2024-05-07 NOTE — Patient Instructions (Addendum)
 Continue fluid management.   Good luck with physical therapy for your back pain.   Consider starting Gemtesa  if you overactive bladder symptoms become more bothersome.   Start pyridium, take 1 tab with food up to 3 times a day to assess color on pad. Orange-red will indicate urinary leakage.  Please consider reviewing continuous birth control options with Dr.  Dodie Frees for ovulation control due to your recurrent bacterial vaginosis.

## 2024-05-10 ENCOUNTER — Ambulatory Visit: Admitting: Obstetrics

## 2024-05-17 ENCOUNTER — Ambulatory Visit: Admitting: Physical Therapy

## 2024-05-20 ENCOUNTER — Ambulatory Visit

## 2024-05-26 NOTE — Therapy (Unsigned)
 OUTPATIENT PHYSICAL THERAPY THORACOLUMBAR EVALUATION   Patient Name: Paula [name] MRN: [id number] DOB:1990/12/19, 33 y.o., female Today's Date: 05/27/2024  END OF SESSION:  PT End of Session - 05/27/24 1137     Visit Number 1    Number of Visits 12    Date for PT Re-Evaluation 07/08/24    Authorization Type MCD [employer]   ODI    PT Start Time 0930    PT Stop Time 1015    PT Time Calculation (min) 45 min    Activity Tolerance Patient tolerated treatment well;Patient limited by pain    Behavior During Therapy [facility] for tasks assessed/performed          Past Medical History:  Diagnosis Date   Anemia    Chlamydia 2011   GERD (gastroesophageal reflux disease)    History of Papanicolaou smear of cervix    Hypertension    Gestational   Low iron    Medical history non-contributory    Sciatica    Past Surgical History:  Procedure Laterality Date   CESAREAN SECTION N/A 05/28/2016   Procedure: CESAREAN SECTION;  Surgeon: Nena App, MD;  Location: [facility] BIRTHING SUITES;  Service: Obstetrics;  Laterality: N/A;   CESAREAN SECTION WITH BILATERAL TUBAL LIGATION Bilateral 03/08/2022   Procedure: CESAREAN SECTION WITH BILATERAL TUBAL LIGATION;  Surgeon: Ozan, Jennifer, DO;  Location: [facility] LD ORS;  Service: Obstetrics;  Laterality: Bilateral;   TUBAL LIGATION     Tubes removed   WISDOM TOOTH EXTRACTION     Patient Active Problem List   Diagnosis Date Noted   Nocturia 02/09/2024   Incomplete bladder emptying 02/09/2024   Vulvar pain 02/09/2024   Pelvic organ prolapse quantification stage 2 cystocele 02/09/2024   Vitamin D  deficiency 11/30/2023   Anemia 11/30/2023   Chronic bilateral low back pain without sciatica 11/30/2023   Mixed stress and urge urinary incontinence 11/30/2023   Dyspareunia in female 11/30/2023   Class 3 severe obesity due to excess calories without serious comorbidity with body mass index (BMI) of 40.0 to 44.9 in adult 05/15/2023   Status post repeat low  transverse cesarean section 03/08/2022   Status post bilateral salpingectomy 03/08/2022   History of gestational hypertension 03/02/2022   Breech presentation 02/22/2022   Supervision of high risk pregnancy, antepartum 12/19/2021   Carrier of spinal muscular atrophy 12/19/2021   History of cesarean delivery 05/29/2016    PCP: Wendolyn Jenkins Jansky, MD   REFERRING PROVIDER: Wendolyn Jenkins Jansky, MD   REFERRING DIAG:  Diagnosis  M54.50,G89.29 (ICD-10-CM) - Chronic bilateral low back pain without sciatica  M53.3 (ICD-10-CM) - Coccygeal pain    Rationale for Evaluation and Treatment: Rehabilitation  THERAPY DIAG:  Bilateral low back pain without sciatica, unspecified chronicity - Plan: PT plan of care cert/re-cert  Pain in the coccyx - Plan: PT plan of care cert/re-cert  Muscle weakness (generalized) - Plan: PT plan of care cert/re-cert  Difficulty in walking, not elsewhere classified - Plan: PT plan of care cert/re-cert  ONSET DATE: At dtr birth 03-08-2024  SUBJECTIVE:  SUBJECTIVE STATEMENT: Pt started having pain in midback down to coccyx after during pregnancy and after delivery without every resolving.  Pt is tired of having constant midback pain that limits her sitting and standing to 30 minutes, My sleep is fine but I wake up in pain and stiffness every morning.  Pt would like to find ways to alleviate pain to  be able to carry 28 lb daughter without issues.  PERTINENT HISTORY:   spinal muscular atrophy DNA, GERD, obesity  PAIN:  Are you having pain? Yes: NPRS scale: 4/10 at rest but at worst is 8/10 Pain location: midline low back and down to coccyx Pain description: stiff and tight and have to move slowly achy pain Aggravating factors: sit for longer than30 minutes, transition from car to stand  and bed to standing, carrying 28 pound daughter, standing for longer than 20 minutes and then sit in chair and walking for more than 30 minutes but then is tremendous pain Relieving factors: Tries to lie down and use heating pad avoids meds change positions often  PRECAUTIONS: None  RED FLAGS: None Pt with bladder leaks and saw uro gynecologist with bladder spasm  WEIGHT BEARING RESTRICTIONS: No  FALLS:  Has patient fallen in last 6 months? No  LIVING ENVIRONMENT: Lives with: lives with their family Lives in: House/apartment Stairs: carrying dtr is difficult but able to walk up the stairs  with no issues only if back is already in spasm Has following equipment at home: None  OCCUPATION: SAHM  PLOF: Independent  PATIENT GOALS: find pain control and relief for back  NEXT MD VISIT: TBD  OBJECTIVE:  Note: Objective measures were completed at Evaluation unless otherwise noted.  DIAGNOSTIC FINDINGS:  EXAM: LUMBAR SPINE - 2-3 VIEW; SACRUM AND COCCYX - 2+ VIEW   COMPARISON:  None Available.   FINDINGS: Lumbar spine: 5 non-rib-bearing lumbar vertebra. Broad-based levo scoliotic curvature of the lower thoracic and lumbar spine. No listhesis. Normal vertebral body heights. No fracture or compression deformity. Minor L5-S1 disc space narrowing. Remaining disc spaces are preserved. No visible pars defects or focal bone abnormalities.   Sacrum and coccyx: Cortical margins of the sacrum and coccyx are intact. Sacral ala are maintained. No fracture. No focal bone abnormality. Minor degenerative spurring of the inferior sacroiliac joints. No sacroiliac erosions. Scattered pelvic phleboliths.   IMPRESSION: 1. Broad-based levo scoliotic curvature of the lower thoracic and lumbar spine. 2. Minor L5-S1 disc space narrowing. 3. Minor degenerative spurring of the inferior sacroiliac joints.  IMPRESSION: 1. Broad-based levo scoliotic curvature of the lower thoracic and lumbar  spine. 2. Minor L5-S1 disc space narrowing. 3. Minor degenerative spurring of the inferior sacroiliac joints.    Electronically Signed   By: Andrea Gasman M.D.   On: 05/01/2024 17:47  PATIENT SURVEYS:  Modified Oswestry:  MODIFIED OSWESTRY DISABILITY SCALE  Date: 05-27-24 Score  Pain intensity 1 = The pain is bad, but I can manage without having to take (1) I can stand as long as I want but, it increases my pain. pain medication.  2. Personal care (washing, dressing, etc.) 1 =  I can take care of myself normally, but it increases my pain.  3. Lifting 1 = I can lift heavy weights, but it causes increased pain.  4. Walking 1 = Pain prevents me from walking more than 1 mile.  5. Sitting 3 =  Pain prevents me from sitting more than  hour.  6. Standing 1 =  I can stand  as long as I want but, it increases my pain.  7. Sleeping 0 = Pain does not prevent me from sleeping well.  8. Social Life 2 = Pain prevents me from participating in more energetic activities (eg. sports, dancing).  9. Traveling 4 = My pain restricts my travel to short necessary journeys under 1/2 hour.  10. Employment/ Homemaking 1 = My normal homemaking/job activities increase my pain, but I can still perform all that is required of me  Total 15/50  30%   Interpretation of scores: Score Category Description  0-20% Minimal Disability The patient can cope with most living activities. Usually no treatment is indicated apart from advice on lifting, sitting and exercise  21-40%  (30% eval) Moderate Disability The patient experiences more pain and difficulty with sitting, lifting and standing. Travel and social life are more difficult and they may be disabled from work. Personal care, sexual activity and sleeping are not grossly affected, and the patient can usually be managed by conservative means  41-60% Severe Disability Pain remains the main problem in this group, but activities of daily living are affected. These patients  require a detailed investigation  61-80% Crippled Back pain impinges on all aspects of the patient's life. Positive intervention is required  81-100% Bed-bound  These patients are either bed-bound or exaggerating their symptoms  Bluford FORBES Zoe DELENA Karon DELENA, et al. Surgery versus conservative management of stable thoracolumbar fracture: the PRESTO feasibility RCT. [place] ([place]): [employer]; 2021 Nov. [facility] Technology Assessment, No. 25.62.) Appendix 3, Oswestry Disability Index category descriptors. Available from: FindJewelers.cz  Minimally Clinically Important Difference (MCID) = 12.8%  COGNITION: Overall cognitive status: Within functional limits for tasks assessed     SENSATION: WFL  MUSCLE LENGTH:  Thomas test: R > L  POSTURE: rounded shoulders, forward head, anterior pelvic tilt, and obesity  PALPATION: TTP over R SI  LUMBAR ROM:   AROM eval  Flexion Finget tips to toes  Extension 50% tightness  Right lateral flexion WNL  Left lateral flexion WNL  Right rotation WNL  Left rotation WNL   (Key: WFL = within functional limits not formally assessed, * = concordant pain, s = stiffness/stretching sensation, NT = not tested)   (Blank rows = not tested, score listed is out of 5 possible points.  N = WNL, D = diminished, C = clear for gross weakness with myotome testing, * = concordant pain with testing)   LOWER EXTREMITY ROM:     Active  Right eval Left eval  Hip flexion    Hip extension    Hip abduction    Hip adduction    Hip internal rotation 40 30 tightness  Hip external rotation 46 38 tightness  Knee flexion    Knee extension    Ankle dorsiflexion    Ankle plantarflexion    Ankle inversion    Ankle eversion     Key: WFL = within functional limits not formally assessed, * = concordant pain, s = stiffness/stretching sensation, NT = not tested)     LOWER EXTREMITY MMT:    abdominal MMT  3/5    MMT Right eval  Left eval  Hip flexion 4+ 4+  Hip extension 4- 4  Hip abduction 4- 4  Hip adduction    Hip internal rotation    Hip external rotation    Knee flexion 5 5  Knee extension 5 5  Ankle dorsiflexion    Ankle plantarflexion    Ankle inversion  Ankle eversion    (Blank rows = not tested, score listed is out of 5 possible points.  N = WNL, D = diminished, C = clear for gross weakness with myotome testing, * = concordant pain with testing)   LUMBAR SPECIAL TESTS:  Straight leg raise test: Negative, Slump test: Negative, and FABER test: Negative Positive Thomas test R> L FUNCTIONAL TESTS:  5 times sit to stand: 18.13 sec  2 minute walk test: TBD Able to squat with heel lifted up Prone plank  14 sec with pain  GAIT: Distance walked: 150 Assistive device utilized: None Level of assistance: Complete Independence Comments: Pt able to walk with normal gait but does report pain in back with more than 20-30 min of standing and walking  TREATMENT DATE: 05-27-24  EVAl and issue HEP                                                                                                                                 PATIENT EDUCATION:  Education details: POC Explanation of findings  issue HEP Person educated: Patient Education method: Explanation, Demonstration, Tactile cues, Verbal cues, and Handouts Education comprehension: verbalized understanding, returned demonstration, verbal cues required, tactile cues required, and needs further education  HOME EXERCISE PROGRAM: Access Code: [email or web address] URL: [email or web address] Date: 05/27/2024 Prepared by: Graydon Dingwall  Exercises - Supine Double Knee to Chest  - 1 x daily - 7 x weekly - 3 sets - 10 reps - Supine Lower Trunk Rotation  - 1 x daily - 7 x weekly - 1 sets - 5 reps - 20 sec hold - Supine Piriformis Stretch Pulling Heel to Hip  - 1 x daily - 7 x weekly - 1 sets - 3-5 reps - 15 sec hold - Supine Bridge with Mini Swiss Ball  Between Knees  - 1 x daily - 7 x weekly - 3 sets - 10 reps  ASSESSMENT:  CLINICAL IMPRESSION: Patient is a 33 y.o. female  who was seen today for physical therapy evaluation and treatment for midback LBP and coccygeal pain since birth of dtr [birth year] with no resolving pain. Pt with stiffness in the morning and throughout day and pain increasing to 8/10 especially with household chores and carrying 28 # toddler. Pt will benefit from skilled PT to address impairments and muscle weakness.   OBJECTIVE IMPAIRMENTS: decreased activity tolerance, difficulty walking, decreased ROM, decreased strength, hypomobility, obesity, and pain.   ACTIVITY LIMITATIONS: carrying, lifting, bending, sitting, standing, stairs, transfers, locomotion level, and caring for others  PARTICIPATION LIMITATIONS: meal prep, cleaning, laundry, driving, shopping, and carrying 28 # toddler  PERSONAL FACTORS: Time since onset of injury/illness/exacerbation and 1-2 comorbidities: see medical record are also affecting patient's functional outcome.   REHAB POTENTIAL: Good  CLINICAL DECISION MAKING: Evolving/moderate complexity  EVALUATION COMPLEXITY: Moderate   GOALS: Goals reviewed with patient? Yes  SHORT TERM GOALS: Target date: 06-17-24  Pt will be I with initial HEP  Baseline:no knowledge Goal status: INITIAL  2.  Sit and stand with RT=LT wt bearing to reduce lumbar strain and allow for increased tolerance for these positions for home tasks Baseline: Pt wt bears to the left when rising from chari Goal status: INITIAL  3.  Demonstrate and verbalize techniques to reduce the risk of re-injury including: lifting, posture, body mechanics.  Baseline: no knowledge Goal status: INITIAL   LONG TERM GOALS: Target date: 07-08-24  Pt will be I with advanced HEP Baseline: no knowledge Goal status: INITIAL  2.  Pt will self report back pain no greater than 3/10 for improved comfort and functional ability Baseline: eval  8/10 Goal status: INITIAL  3.  Pt. will show a >/= 12 pt improvement in their ODI score (MCID is 12 pts) as a proxy for functional improvement Baseline: 15/50  30% Goal status: INITIAL  4.   Pt will be able to lift 45 # with proper body mechanics in order to prepare for carrying small child in store and up and down steps with minimal pain Baseline: difficulty lifting 28 # child Goal status: INITIAL  5.  Pt will be able to demonstrate prone plank for 60 seconds to show increased core control without exacerbating back pain. Baseline: 14 sec with pain in LB Goal status: INITIAL  6.  Pt will improve all tested MMT to no less than 4/5 for improved postural endurance reduced back pain Baseline: See MMT chart Goal status: INITIAL  PLAN:  PT FREQUENCY: 1-2x/week  PT DURATION: 6 weeks  PLANNED INTERVENTIONS: 97164- PT Re-evaluation, 97750- Physical Performance Testing, 97110-Therapeutic exercises, 97530- Therapeutic activity, [id number]- Neuromuscular re-education, 97535- Self Care, 02859- Manual therapy, [id number]- Gait training, [phone]- Electrical stimulation (manual), Patient/Family education, Stair training, Taping, Joint mobilization, Spinal mobilization, Cryotherapy, and Moist heat.  PLAN FOR NEXT SESSION: check SI and manual and progress HEP/ core training  For all possible CPT codes, reference the Planned Interventions line above.     Check all conditions that are expected to impact treatment: {Conditions expected to impact treatment:Musculoskeletal disorders        Graydon Dingwall, PT, [facility] Certified Exercise Expert for the Aging Adult  05/27/24 4:19 PM Phone: [phone] Fax: [phone]

## 2024-05-27 ENCOUNTER — Encounter: Payer: Self-pay | Admitting: Physical Therapy

## 2024-05-27 ENCOUNTER — Ambulatory Visit: Attending: Family Medicine | Admitting: Physical Therapy

## 2024-05-27 DIAGNOSIS — M545 Low back pain, unspecified: Secondary | ICD-10-CM | POA: Diagnosis present

## 2024-05-27 DIAGNOSIS — M533 Sacrococcygeal disorders, not elsewhere classified: Secondary | ICD-10-CM | POA: Insufficient documentation

## 2024-05-27 DIAGNOSIS — G8929 Other chronic pain: Secondary | ICD-10-CM | POA: Insufficient documentation

## 2024-05-27 DIAGNOSIS — R262 Difficulty in walking, not elsewhere classified: Secondary | ICD-10-CM | POA: Diagnosis present

## 2024-05-27 DIAGNOSIS — M6281 Muscle weakness (generalized): Secondary | ICD-10-CM | POA: Diagnosis present

## 2024-06-09 ENCOUNTER — Ambulatory Visit

## 2024-06-14 ENCOUNTER — Ambulatory Visit: Admitting: Physical Therapy

## 2024-06-22 ENCOUNTER — Encounter: Admitting: Physical Therapy

## 2024-06-23 ENCOUNTER — Ambulatory Visit: Payer: Self-pay | Admitting: Family Medicine

## 2024-06-23 ENCOUNTER — Encounter: Payer: Self-pay | Admitting: Family Medicine

## 2024-06-23 ENCOUNTER — Ambulatory Visit (INDEPENDENT_AMBULATORY_CARE_PROVIDER_SITE_OTHER): Payer: Medicaid Other | Admitting: Family Medicine

## 2024-06-23 VITALS — BP 128/85 | HR 74 | Temp 98.2°F | Resp 18 | Ht 64.5 in | Wt 213.2 lb

## 2024-06-23 DIAGNOSIS — E559 Vitamin D deficiency, unspecified: Secondary | ICD-10-CM | POA: Diagnosis not present

## 2024-06-23 DIAGNOSIS — E7800 Pure hypercholesterolemia, unspecified: Secondary | ICD-10-CM | POA: Diagnosis not present

## 2024-06-23 DIAGNOSIS — D500 Iron deficiency anemia secondary to blood loss (chronic): Secondary | ICD-10-CM | POA: Diagnosis not present

## 2024-06-23 DIAGNOSIS — G8929 Other chronic pain: Secondary | ICD-10-CM

## 2024-06-23 DIAGNOSIS — E538 Deficiency of other specified B group vitamins: Secondary | ICD-10-CM

## 2024-06-23 DIAGNOSIS — M545 Low back pain, unspecified: Secondary | ICD-10-CM

## 2024-06-23 LAB — COMPREHENSIVE METABOLIC PANEL WITH GFR
ALT: 7 U/L (ref 0–35)
AST: 10 U/L (ref 0–37)
Albumin: 4.2 g/dL (ref 3.5–5.2)
Alkaline Phosphatase: 68 U/L (ref 39–117)
BUN: 10 mg/dL (ref 6–23)
CO2: 29 meq/L (ref 19–32)
Calcium: 9.2 mg/dL (ref 8.4–10.5)
Chloride: 103 meq/L (ref 96–112)
Creatinine, Ser: 0.83 mg/dL (ref 0.40–1.20)
GFR: 91.24 mL/min (ref >60.00)
Glucose, Bld: 80 mg/dL (ref 70–99)
Potassium: 4.2 meq/L (ref 3.5–5.1)
Sodium: 139 meq/L (ref 135–145)
Total Bilirubin: 0.5 mg/dL (ref 0.2–1.2)
Total Protein: 6.9 g/dL (ref 6.0–8.3)

## 2024-06-23 LAB — CBC WITH DIFFERENTIAL/PLATELET
Basophils Absolute: 0 K/uL (ref 0.0–0.1)
Basophils Relative: 0.5 % (ref 0.0–3.0)
Eosinophils Absolute: 0 K/uL (ref 0.0–0.7)
Eosinophils Relative: 0.4 % (ref 0.0–5.0)
HCT: 37.1 % (ref 36.0–46.0)
Hemoglobin: 12.1 g/dL (ref 12.0–15.0)
Lymphocytes Relative: 38.2 % (ref 12.0–46.0)
Lymphs Abs: 2.1 K/uL (ref 0.7–4.0)
MCHC: 32.6 g/dL (ref 30.0–36.0)
MCV: 89.4 fl (ref 78.0–100.0)
Monocytes Absolute: 0.4 K/uL (ref 0.1–1.0)
Monocytes Relative: 8 % (ref 3.0–12.0)
Neutro Abs: 2.9 K/uL (ref 1.4–7.7)
Neutrophils Relative %: 52.9 % (ref 43.0–77.0)
Platelets: 249 K/uL (ref 150.0–400.0)
RBC: 4.15 Mil/uL (ref 3.87–5.11)
RDW: 13.6 % (ref 11.5–15.5)
WBC: 5.5 K/uL (ref 4.0–10.5)

## 2024-06-23 LAB — IBC + FERRITIN
Ferritin: 18.2 ng/mL (ref 10.0–291.0)
Iron: 52 ug/dL (ref 42–145)
Saturation Ratios: 15.3 % — ABNORMAL LOW (ref 20.0–50.0)
TIBC: 338.8 ug/dL (ref 250.0–450.0)
Transferrin: 242 mg/dL (ref 212.0–360.0)

## 2024-06-23 LAB — LIPID PANEL
Cholesterol: 224 mg/dL — ABNORMAL HIGH (ref 0–200)
HDL: 47.1 mg/dL (ref >39.00)
LDL Cholesterol: 159 mg/dL — ABNORMAL HIGH (ref 0–99)
NonHDL: 176.78
Total CHOL/HDL Ratio: 5
Triglycerides: 90 mg/dL (ref 0.0–149.0)
VLDL: 18 mg/dL (ref 0.0–40.0)

## 2024-06-23 LAB — VITAMIN B12: Vitamin B-12: 174 pg/mL — ABNORMAL LOW (ref 211–911)

## 2024-06-23 LAB — VITAMIN D 25 HYDROXY (VIT D DEFICIENCY, FRACTURES): VITD: 17.3 ng/mL — ABNORMAL LOW (ref 30.00–100.00)

## 2024-06-23 NOTE — Progress Notes (Signed)
 Subjective:     Patient ID: Paula [name], female    DOB: 21-Aug-1991, 33 y.o.   MRN: [id number]  Chief Complaint  Patient presents with   Medical Management of Chronic Issues    6 month follow-up Fasting     HPI D b12,wt-[facility] Discussed the use of AI scribe software for clinical note transcription with the patient, who gave verbal consent to proceed.  History of Present Illness Paula [name] is a 33 year old female with anemia and heavy menstrual bleeding who presents for evaluation of her anemia and menstrual issues.  She experiences ongoing heavy menstrual bleeding, managed by alternating between pads and Depends. She changes Depends two to three times a day and uses pads on lighter days, changing them two to three times as well. There is a reduction in clotting compared to the past. She has seen a urogynecologist who focused on bladder spasms and suggested birth control, which she has not found effective in the past. She has had her tubes removed and does not desire more children.  She continues to take iron supplements for anemia and has been advised to consider B12 supplementation as her levels were on the low end of normal(taking multivit) She is unsure if she was taking multivitamins during her last blood work. She also takes vitamin D  and has increased her fiber intake.  She is currently on Wegovy for weight management and her weight has plateaued between 213 and 211 pounds. Walking is her primary form of exercise, and she has previously used the elliptical and treadmill. She is concerned about her elevated cholesterol levels.  She has a history of an enlarged uterus, which was not attributed to fibroids, and was told it might be due to having children. She experiences bladder spasms and is unsure if they are related to her back pain or heavy periods. She has previously seen a physical therapist for her back pain, which provided stretches but did not  alleviate her symptoms. She avoids heavy lifting and limits picking up her daughter to manage her back pain.    Health Maintenance Due  Topic Date Due   Hepatitis B Vaccines (2 of 3 - 19+ 3-dose series) 09/12/2011    Past Medical History:  Diagnosis Date   Anemia    Chlamydia 2011   GERD (gastroesophageal reflux disease)    History of Papanicolaou smear of cervix    Hypertension    Gestational   Low iron    Medical history non-contributory    Sciatica     Past Surgical History:  Procedure Laterality Date   CESAREAN SECTION N/A 05/28/2016   Procedure: CESAREAN SECTION;  Surgeon: Nena App, MD;  Location: [facility] BIRTHING SUITES;  Service: Obstetrics;  Laterality: N/A;   CESAREAN SECTION WITH BILATERAL TUBAL LIGATION Bilateral 03/08/2022   Procedure: CESAREAN SECTION WITH BILATERAL TUBAL LIGATION;  Surgeon: Ozan, Jennifer, DO;  Location: [facility] LD ORS;  Service: Obstetrics;  Laterality: Bilateral;   TUBAL LIGATION     Tubes removed   WISDOM TOOTH EXTRACTION       Current Outpatient Medications:    ferrous sulfate  325 (65 FE) MG tablet, Take 325 mg by mouth daily with breakfast., Disp: , Rfl:    Multiple Vitamin (MULTIVITAMIN WITH MINERALS) TABS tablet, Take 1 tablet by mouth daily., Disp: , Rfl:    VITAMIN D  PO, Take 1 tablet by mouth daily., Disp: , Rfl:    WEGOVY 2.4 MG/0.75ML SOAJ, SMARTSIG:2.4 Milligram(s) SUB-Q Once a Week,  Disp: , Rfl:   No Known Allergies ROS neg/noncontributory except as noted HPI/below      Objective:     BP 128/85   Pulse 74   Temp 98.2 F (36.8 C) (Temporal)   Resp 18   Ht 5' 4.5 (1.638 m)   Wt 213 lb 4 oz (96.7 kg)   LMP 06/01/2024 (Exact Date)   SpO2 100%   BMI 36.04 kg/m  Wt Readings from Last 3 Encounters:  06/23/24 213 lb 4 oz (96.7 kg)  04/29/24 220 lb (99.8 kg)  02/09/24 220 lb (99.8 kg)    Physical Exam   Gen: WDWN NAD HEENT: NCAT, conjunctiva not injected, sclera nonicteric NECK:  supple, no thyromegaly, no nodes, no  carotid bruits CARDIAC: RRR, S1S2+, no murmur. DP 2+B LUNGS: CTAB. No wheezes ABDOMEN:  BS+, soft, NTND, No HSM, no masses EXT:  no edema MSK: no gross abnormalities.  NEURO: A&O x3.  CN II-XII intact.  PSYCH: normal mood. Good eye contact     Assessment & Plan:  Iron deficiency anemia due to chronic blood loss -     CBC with Differential/Platelet -     IBC + Ferritin  Pure hypercholesterolemia -     Comprehensive metabolic panel with GFR -     Lipid panel  Vitamin D  deficiency -     VITAMIN D  25 Hydroxy (Vit-D Deficiency, Fractures)  B12 deficiency -     Vitamin B12  Chronic bilateral low back pain without sciatica  Assessment and Plan Assessment & Plan Iron deficiency anemia secondary to menorrhagia   Iron deficiency anemia is likely due to heavy menstrual bleeding, with reports of using multiple pads and Depends. Clots are no longer present. Birth control was ineffective in regulating bleeding. Consider endometrial ablation or other medications to manage bleeding. Order blood work to assess anemia status and ordered referral to a gynecologist for evaluation and potential endometrial ablation.  Menorrhagia   Heavy menstrual bleeding persists. She uses multiple pads and Depends. Discussed endometrial ablation under anesthesia and medications to induce menopause or reduce period heaviness. Consider referral to a gynecologist for evaluation and treatment.  Enlarged uterus, etiology unclear   The uterus is enlarged with an unclear etiology. Previous evaluations ruled out fibroids. Possible adenomyosis is considered but not confirmed. Further evaluation by a gynecologist is recommended.  Chronic low back pain   Chronic low back pain continues. Physical therapy provided stretches but did not significantly improve symptoms. She avoids heavy lifting and picking up her daughter. Further evaluation by a sports medicine specialist is recommended.  Obesity on anti-obesity medication    Obesity management with Georjean shows a weight plateau at 213-211 lbs. She walks for exercise. Consider switching to Zepbound if no significant weight loss occurs. Discuss incorporating varied exercise routines, including jogging and weight training. Continue Wegovy and follow up with a weight management specialist in one month for a potential switch to Zepbound.  Hyperlipidemia   Hyperlipidemia persists, with acknowledgment of room for dietary improvement. Consider the potential impact of weight management medication change on lipid levels. Encourage dietary modifications to improve the lipid profile and reassess lipid levels after a potential switch to Zepbound.  Vitamin D  deficiency   Vitamin D  deficiency is managed with supplementation.  Vitamin B12 deficiency   Vitamin B12 deficiency is noted to be on the low end of normal. She is not currently taking B12 supplements regularly. Encourage regular intake of B12 supplements.    Return in about 6 months (  around 12/24/2024) for annual physical.  Jenkins CHRISTELLA Carrel, MD

## 2024-06-23 NOTE — Patient Instructions (Signed)
 It was very nice to see you today!  [facility] at [facility]  [address] on the 1st floor Phone number [phone]    PLEASE NOTE:  If you had any lab tests please let us know if you have not heard back within a few days. You may see your results on MyChart before we have a chance to review them but we will give you a call once they are reviewed by [place]. If we ordered any referrals today, please let us know if you have not heard from their office within the next week.   Please try these tips to maintain a healthy lifestyle:  Eat most of your calories during the day when you are active. Eliminate processed foods including packaged sweets (pies, cakes, cookies), reduce intake of potatoes, white bread, white pasta, and white rice. Look for whole grain options, oat flour or almond flour.  Each meal should contain half fruits/vegetables, one quarter protein, and one quarter carbs (no bigger than a computer mouse).  Cut down on sweet beverages. This includes juice, soda, and sweet tea. Also watch fruit intake, though this is a healthier sweet option, it still contains natural sugar! Limit to 3 servings daily.  Drink at least 1 glass of water with each meal and aim for at least 8 glasses per day  Exercise at least 150 minutes every week.

## 2024-06-23 NOTE — Progress Notes (Signed)
 Iron stores still low-see the gynecologist 2.  Your cholesterol levels are elevated.  Work on low cholesterol and lower carbs/sugars diet and  get exercise to try to lower your cholesterol.  3.  B12 is low-can do shots or take otc daily 4.  D is low-send in 50,000iu/wk #12/1

## 2024-06-24 ENCOUNTER — Other Ambulatory Visit: Payer: Self-pay | Admitting: *Deleted

## 2024-06-24 ENCOUNTER — Telehealth: Payer: Self-pay | Admitting: *Deleted

## 2024-06-24 DIAGNOSIS — R790 Abnormal level of blood mineral: Secondary | ICD-10-CM

## 2024-06-24 MED ORDER — VITAMIN D (ERGOCALCIFEROL) 1.25 MG (50000 UNIT) PO CAPS: 50000.0000 [IU] | ORAL_CAPSULE | ORAL | 1 refills | Status: DC

## 2024-06-24 NOTE — Telephone Encounter (Signed)
 Copied from CRM [id number]. Topic: General - Billing Inquiry >> Jun 24, 2024  1:40 PM Thersia C wrote: Reason for CRM: Patient called in regarding B12 Injection, wanted to know the price and would like for a nurse to give her a callback regarding this

## 2024-06-25 HISTORY — PX: WISDOM TOOTH EXTRACTION: SHX21

## 2024-06-25 NOTE — Telephone Encounter (Signed)
 Spoke with pt & advised out of pocket cost would estimate to be $69. Also advised pt to check with insurance. With it being requested by provider, it should be covered.

## 2024-07-01 ENCOUNTER — Encounter: Payer: Self-pay | Admitting: Dermatology

## 2024-07-01 ENCOUNTER — Ambulatory Visit: Payer: Medicaid Other | Admitting: Dermatology

## 2024-07-01 VITALS — BP 134/86

## 2024-07-01 DIAGNOSIS — L680 Hirsutism: Secondary | ICD-10-CM

## 2024-07-01 DIAGNOSIS — L810 Postinflammatory hyperpigmentation: Secondary | ICD-10-CM | POA: Diagnosis not present

## 2024-07-01 MED ORDER — SPIRONOLACTONE 100 MG PO TABS: 100.0000 mg | ORAL_TABLET | Freq: Every day | ORAL | 3 refills | Status: DC

## 2024-07-01 MED ORDER — SAFETY SEAL MISCELLANEOUS MISC: 1.0000 | Freq: Every morning | 3 refills | Status: DC

## 2024-07-01 MED ORDER — SAFETY SEAL MISCELLANEOUS MISC: 1.0000 | Freq: Every morning | 3 refills | Status: AC

## 2024-07-01 MED ORDER — TRETINOIN 0.025 % EX CREA: TOPICAL_CREAM | Freq: Every day | CUTANEOUS | 3 refills | Status: DC

## 2024-07-01 NOTE — Patient Instructions (Addendum)
 Date: Thu Jul 01 2024  Hello Paula [name],  Thank you for visiting today. Here is a summary of the key instructions:  - Medications:   - Start taking spironolactone  as prescribed to slow down hair growth   - Use tretinoin  2-3 nights a week to prevent ingrown hairs   - Apply melasmic lightening cream from [facility] pharmacy as directed  - Skin Care:   - Apply Centella sunscreen every morning  - Hair Removal:   - Use electric facial hair removers instead of regular razors   - Reduce hair removal frequency to every few days  - Follow-up:   - Return for a follow-up appointment in 4 months  - Other Instructions:   - Check MyChart for detailed instructions and information   - Watch for changes in menstrual cycle or increased bleeding   - Report any lightheadedness or dizziness   - Expect to see improvement in skin discoloration after about 4 months of treatment  Please reach out if you have any questions or concerns.  Warm regards,  Dr. [facility]          Spironolactone  can cause increased urination and cause blood pressure to decrease. Please watch for signs of lightheadedness and be cautious when changing position. It can sometimes cause breast tenderness or an irregular period in premenopausal women. It can also increase potassium. The increase in potassium usually is not a concern unless you are taking other medicines that also increase potassium, so please be sure your doctor knows all of the other medications you are taking. This medication should not be taken by pregnant women.  This medicine should also not be taken together with sulfa  drugs like Bactrim  (trimethoprim /sulfamethexazole).    Topical retinoid medications like tretinoin /Retin-A , adapalene/Differin, tazarotene/Fabior, and Epiduo/Epiduo Forte can cause dryness and irritation when first started. Only apply a pea-sized amount to the entire affected area. Avoid applying it around the eyes, edges of  mouth and creases at the nose. If you experience irritation, use a good moisturizer first and/or apply the medicine less often. If you are doing well with the medicine, you can increase how often you use it until you are applying every night. Be careful with sun protection while using this medication as it can make you sensitive to the sun. This medicine should not be used by pregnant women.    Your provider has sent your prescription to [facility] in [place], [place] . A pharmacy representative will call you to confirm details and take your payment information. If you do not receive a call within 24 hours, please contact the pharmacy at [phone] or [phone]. Your unique skincare compound is being formulated in our lab (most compounds take less than 24 hours). Your prescription is shipped vis USPS to your mailbox (2-4 business days). Priority shipping is available at an additional cost. Once received, you will electronically sign/acknowledge that you received your prescription. The pharmacy hours are Monday-Friday 9 am-6 pm EST and Saturday 9 am-1 pm EST.     Important Information  Due to recent changes in healthcare laws, you may see results of your pathology and/or laboratory studies on MyChart before the doctors have had a chance to review them. We understand that in some cases there may be results that are confusing or concerning to you. Please understand that not all results are received at the same time and often the doctors may need to interpret multiple results in order to provide you with the best plan of care or  course of treatment. Therefore, we ask that you please give us  2 business days to thoroughly review all your results before contacting the office for clarification. Should we see a critical lab result, you will be contacted sooner.   If You Need Anything After Your Visit  If you have any questions or concerns for your doctor, please call our main line at [phone]  If no one answers, please leave a voicemail as directed and we will return your call as soon as possible. Messages left after 4 pm will be answered the following business day.   You may also send us  a message via MyChart. We typically respond to MyChart messages within 1-2 business days.  For prescription refills, please ask your pharmacy to contact our office. Our fax number is [phone].  If you have an urgent issue when the clinic is closed that cannot wait until the next business day, you can page your doctor at the number below.    Please note that while we do our best to be available for urgent issues outside of office hours, we are not available 24/7.   If you have an urgent issue and are unable to reach us , you may choose to seek medical care at your doctor's office, retail clinic, urgent care center, or emergency room.  If you have a medical emergency, please immediately call 911 or go to the emergency department. In the event of inclement weather, please call our main line at [phone] for an update on the status of any delays or closures.  Dermatology Medication Tips: Please keep the boxes that topical medications come in in order to help keep track of the instructions about where and how to use these. Pharmacies typically print the medication instructions only on the boxes and not directly on the medication tubes.   If your medication is too expensive, please contact our office at [phone] or send us  a message through MyChart.   We are unable to tell what your co-pay for medications will be in advance as this is different depending on your insurance coverage. However, we may be able to find a substitute medication at lower cost or fill out paperwork to get insurance to cover a needed medication.   If a prior authorization is required to get your medication covered by your insurance company, please allow us  1-2 business days to complete this process.  Drug prices often  vary depending on where the prescription is filled and some pharmacies may offer cheaper prices.  The website www.goodrx.com contains coupons for medications through different pharmacies. The prices here do not account for what the cost may be with help from insurance (it may be cheaper with your insurance), but the website can give you the price if you did not use any insurance.  - You can print the associated coupon and take it with your prescription to the pharmacy.  - You may also stop by our office during regular business hours and pick up a GoodRx coupon card.  - If you need your prescription sent electronically to a different pharmacy, notify our office through [facility] or by phone at [phone]

## 2024-07-01 NOTE — Progress Notes (Signed)
   New Patient Visit   Subjective  Paula [name] is a 33 y.o. female who presents for the following: Dark spots of face. They have been there for a couple years and seemed to get worse after her daughter was born. She gets ingrown hairs and plucks and shaves those areas. She has not tried any prescription medications but has tried OTC serums. She has not been diagnosed with PCOS.    The following portions of the chart were reviewed this encounter and updated as appropriate: medications, allergies, medical history  Review of Systems:  No other skin or systemic complaints except as noted in HPI or Assessment and Plan.  Objective  Well appearing patient in no apparent distress; mood and affect are within normal limits.    A focused examination was performed of the following areas: Face   Relevant exam findings are noted in the Assessment and Plan.            Assessment & Plan   HIRSUTISM and PIH  - Assessment: Patient reports discoloration and bumps from ingrown hairs, which she sometimes picks at. She has not previously seen a dermatologist for this issue. The patient has tried various over-the-counter products including azelaic acid, sunscreen, cocoa butter oil, PanOxyl, and essence spray without significant improvement. She removes hair every couple of days. The frequent hair removal and picking may be contributing to the ingrown hairs and subsequent hyperpigmentation. There is a possibility of underlying hormonal influence, as evidenced by the discussion of PCOS and hirsutism, though the patient reports regular but heavy periods.  - Plan:    Prescribe spironolactone  to slow down hair growth    Prescribe gentle retinoid to use 2-3 nights a week for cell turnover and ingrown hair prevention    Prescribe melasmic lightening cream from compounding pharmacy containing tranexamic acid, kojic acid, erythritol, and vitamin C    Recommend daily use of sunscreen,  specifically Scintella sunscreen    Recommend using electric facial hair removers instead of regular razors    Instruct patient to expect results in approximately 4 months  Follow-up in 4 months to assess progress.    Return in about 4 months (around 10/31/2024) for Follow up hirsutism.  I, Roseline Hutchinson, CMA, am acting as scribe for [employer], DO .   Documentation: I have reviewed the above documentation for accuracy and completeness, and I agree with the above.  Delon Lenis, DO

## 2024-07-19 ENCOUNTER — Ambulatory Visit: Admitting: Obstetrics

## 2024-07-20 ENCOUNTER — Ambulatory Visit (INDEPENDENT_AMBULATORY_CARE_PROVIDER_SITE_OTHER): Admitting: Radiology

## 2024-07-20 ENCOUNTER — Encounter: Payer: Self-pay | Admitting: Radiology

## 2024-07-20 VITALS — BP 124/78 | HR 87 | Ht 65.5 in | Wt 208.0 lb

## 2024-07-20 DIAGNOSIS — N920 Excessive and frequent menstruation with regular cycle: Secondary | ICD-10-CM

## 2024-07-20 NOTE — Progress Notes (Signed)
 Paula [name] [birth year] [id number]   History:  33 y.o. G3P2 presents as a new patient. Referred from [facility] for low iron Heavy menstrual bleeding alternates pads and depends. Changes depends two to three times a day. (Per PCP ov note 06/23/24) pelvic CT 10/2023 (for enlarged uterus). Complains of heavy periods x's 7 years.   Gynecologic History Patient's last menstrual period was 07/02/2024 (exact date). Period Cycle (Days):  (32-34) Period Duration (Days): 6 Period Pattern: Regular Menstrual Flow: Heavy Menstrual Control: Thin pad, Maxi pad (wears depends) Dysmenorrhea: (!) Moderate (moderate cramping prior to periods) Dysmenorrhea Symptoms: Cramping Contraception/Family planning: tubal ligation Sexually active: yes Last Pap: 11/03/23. Results were: ASCUS HPV negative   Obstetric History OB History  Gravida Para Term Preterm AB Living  3 2 1 1 1 2   SAB IAB Ectopic Multiple Live Births  1   0 2    # Outcome Date GA Lbr Len/2nd Weight Sex Type Anes PTL Lv  3 Preterm 03/08/22 [redacted]w[redacted]d  5 lb 7.8 oz (2.49 kg) F CS-LTranv Spinal  LIV  2 Term 05/28/16 [redacted]w[redacted]d  5 lb 1.1 oz (2.3 kg) F CS-LTranv EPI  LIV  1 SAB             Obstetric Comments  Chemical preg  C/s: placenta started to detach    Narrative & Impression  CLINICAL DATA:  Right lower quadrant abdominal pain.   EXAM: CT ABDOMEN AND PELVIS WITH CONTRAST   TECHNIQUE: Multidetector CT imaging of the abdomen and pelvis was performed using the standard protocol following bolus administration of intravenous contrast.   RADIATION DOSE REDUCTION: This exam was performed according to the departmental dose-optimization program which includes automated exposure control, adjustment of the mA and/or kV according to patient size and/or use of iterative reconstruction technique.   CONTRAST:  OMNIPAQUE  IOHEXOL  300 MG/ML  SOLN   COMPARISON:  None Available.   FINDINGS: Lower chest: The lung bases are clear. No  pleural effusion. The heart is normal in size. No pericardial effusion.   Hepatobiliary: The liver is normal in size. Non-cirrhotic configuration. No suspicious mass. No intrahepatic or extrahepatic bile duct dilation. There is a single sub 5 mm gallstone without imaging signs of acute cholecystitis. Normal gallbladder wall thickness. No pericholecystic inflammatory changes.   Pancreas: Unremarkable. No pancreatic ductal dilatation or surrounding inflammatory changes.   Spleen: Within normal limits. No focal lesion.   Adrenals/Urinary Tract: Adrenal glands are unremarkable. No suspicious renal mass. No hydronephrosis. No renal or ureteric calculi. Urinary bladder is under distended, precluding optimal assessment. However, no large mass or stones identified. No perivesical fat stranding.   Stomach/Bowel: No disproportionate dilation of the small or large bowel loops. No evidence of abnormal bowel wall thickening or inflammatory changes. The appendix is unremarkable. There are scattered diverticula mainly in the sigmoid colon, without imaging signs of diverticulitis.   Vascular/Lymphatic: No ascites or pneumoperitoneum. No abdominal or pelvic lymphadenopathy, by size criteria. No aneurysmal dilation of the major abdominal arteries.   Reproductive: The uterus is unremarkable. No large adnexal mass.   Other: There is a tiny fat containing umbilical hernia. The soft tissues and abdominal wall are otherwise unremarkable.   Musculoskeletal: No suspicious osseous lesions.   IMPRESSION: *No acute inflammatory process identified within the abdomen or pelvis. Unremarkable appendix. *Multiple other nonacute observations, as described above.     Electronically Signed   By: Ree Molt M.D.   On: 11/18/2023 16:21       The  following portions of the patient's history were reviewed and updated as appropriate: allergies, current medications, past family history, past medical  history, past social history, past surgical history, and problem list.  Review of Systems  All other systems reviewed and are negative.   Past medical history, past surgical history, family history and social history were all reviewed and documented in the EPIC chart.  Exam:  Vitals:   07/20/24 0909  BP: 124/78  Pulse: 87  SpO2: 98%  Weight: 208 lb (94.3 kg)  Height: 5' 5.5 (1.664 m)   Body mass index is 34.09 kg/m.  Physical Exam Exam conducted with a chaperone present.  Constitutional:      Appearance: Normal appearance. She is obese.  Pulmonary:     Effort: Pulmonary effort is normal.  Abdominal:     General: Abdomen is flat. Bowel sounds are normal.     Palpations: Abdomen is soft.  Genitourinary:    General: Normal vulva.     Vagina: Normal.     Cervix: Normal.     Uterus: Enlarged (10-12 week size).      Adnexa: Right adnexa normal and left adnexa normal.  Neurological:     Mental Status: She is alert.  Psychiatric:        Mood and Affect: Mood normal.        Thought Content: Thought content normal.        Judgment: Judgment normal.      Paula [name], CMA present for exam  Assessment/Plan:   1. Menorrhagia with regular cycle (Primary) Discussed management options depending what the u/s shows including OCPs, IUD, Lysteda, ablation, POPs, hysterectomy - US  PELVIS TRANSVAGINAL NON-OB (TV ONLY); Future    Return for u/s only return for visit with me.  Paula [name] B WHNP-BC 9:41 AM 07/20/2024

## 2024-08-04 ENCOUNTER — Telehealth: Payer: Self-pay

## 2024-08-04 NOTE — Telephone Encounter (Signed)
 Patient is on the 5th day of her cycle and Jami gave verbal ok to keep appointment with the information.

## 2024-08-04 NOTE — Telephone Encounter (Signed)
 Patient left message on triage voicemail stating that she has an ultrasound tomorrow and she has started her cycle. Patient wants to know if it will be ok the proceed with ultrasound or if she needs to reschedule. Please advise

## 2024-08-04 NOTE — Telephone Encounter (Signed)
 Recommend rescheduling.

## 2024-08-05 ENCOUNTER — Ambulatory Visit: Payer: Self-pay | Admitting: Radiology

## 2024-08-05 ENCOUNTER — Ambulatory Visit (INDEPENDENT_AMBULATORY_CARE_PROVIDER_SITE_OTHER)

## 2024-08-05 DIAGNOSIS — N920 Excessive and frequent menstruation with regular cycle: Secondary | ICD-10-CM | POA: Diagnosis not present

## 2024-08-06 NOTE — Progress Notes (Signed)
 Schedule OV to discuss further.

## 2024-08-10 ENCOUNTER — Emergency Department (HOSPITAL_BASED_OUTPATIENT_CLINIC_OR_DEPARTMENT_OTHER)

## 2024-08-10 ENCOUNTER — Encounter: Payer: Self-pay | Admitting: Radiology

## 2024-08-10 ENCOUNTER — Ambulatory Visit (INDEPENDENT_AMBULATORY_CARE_PROVIDER_SITE_OTHER): Admitting: Radiology

## 2024-08-10 ENCOUNTER — Encounter (HOSPITAL_BASED_OUTPATIENT_CLINIC_OR_DEPARTMENT_OTHER): Payer: Self-pay

## 2024-08-10 ENCOUNTER — Other Ambulatory Visit: Payer: Self-pay

## 2024-08-10 ENCOUNTER — Emergency Department (HOSPITAL_BASED_OUTPATIENT_CLINIC_OR_DEPARTMENT_OTHER)
Admission: EM | Admit: 2024-08-10 | Discharge: 2024-08-10 | Disposition: A | Discharge status: Home / Self Care | Attending: Emergency Medicine | Admitting: Emergency Medicine

## 2024-08-10 VITALS — BP 114/70 | HR 90 | Wt 213.0 lb

## 2024-08-10 DIAGNOSIS — N920 Excessive and frequent menstruation with regular cycle: Secondary | ICD-10-CM

## 2024-08-10 DIAGNOSIS — I10 Essential (primary) hypertension: Secondary | ICD-10-CM | POA: Insufficient documentation

## 2024-08-10 DIAGNOSIS — R519 Headache, unspecified: Secondary | ICD-10-CM | POA: Insufficient documentation

## 2024-08-10 DIAGNOSIS — Z30011 Encounter for initial prescription of contraceptive pills: Secondary | ICD-10-CM

## 2024-08-10 MED ORDER — METOCLOPRAMIDE HCL 5 MG/ML IJ SOLN
INTRAMUSCULAR | Status: AC
  Filled 2024-08-10: qty 2

## 2024-08-10 MED ORDER — KETOROLAC TROMETHAMINE 15 MG/ML IJ SOLN
INTRAMUSCULAR | Status: AC
  Filled 2024-08-10: qty 1

## 2024-08-10 MED ORDER — DIPHENHYDRAMINE HCL 50 MG/ML IJ SOLN
INTRAMUSCULAR | Status: AC
  Filled 2024-08-10: qty 1

## 2024-08-10 MED ORDER — METOCLOPRAMIDE HCL 5 MG/ML IJ SOLN
10.0000 mg | Freq: Once | INTRAMUSCULAR | Status: AC
  Administered 2024-08-10: 10 mg via INTRAVENOUS

## 2024-08-10 MED ORDER — KETOROLAC TROMETHAMINE 15 MG/ML IJ SOLN
15.0000 mg | Freq: Once | INTRAMUSCULAR | Status: AC
  Administered 2024-08-10: 15 mg via INTRAVENOUS

## 2024-08-10 MED ORDER — LO LOESTRIN FE 1 MG-10 MCG / 10 MCG PO TABS: 1.0000 | ORAL_TABLET | Freq: Every day | ORAL | 1 refills | Status: AC

## 2024-08-10 MED ORDER — DIPHENHYDRAMINE HCL 50 MG/ML IJ SOLN
25.0000 mg | Freq: Once | INTRAMUSCULAR | Status: AC
  Administered 2024-08-10: 25 mg via INTRAVENOUS

## 2024-08-10 NOTE — Progress Notes (Signed)
 Paula [name] 11-04-1991 [id number]   History:  33 y.o. G3P2 presents to discuss u/s results from last visit. Referred from [facility] for low iron Heavy menstrual bleeding alternates pads and depends. Changes depends two to three times a day. (Per PCP ov note 06/23/24) pelvic CT 10/2023 (for enlarged uterus). Complains of heavy periods x's 7 years.   Gynecologic History Patient's last menstrual period was 08/01/2024.   Contraception/Family planning: tubal ligation Sexually active: yes Last Pap: 11/03/23. Results were: ASCUS HPV negative   Obstetric History OB History  Gravida Para Term Preterm AB Living  3 2 1 1 1 2   SAB IAB Ectopic Multiple Live Births  1   0 2    # Outcome Date GA Lbr Len/2nd Weight Sex Type Anes PTL Lv  3 Preterm 03/08/22 [redacted]w[redacted]d  5 lb 7.8 oz (2.49 kg) F CS-LTranv Spinal  LIV  2 Term 05/28/16 [redacted]w[redacted]d  5 lb 1.1 oz (2.3 kg) F CS-LTranv EPI  LIV  1 SAB             Obstetric Comments  Chemical preg  C/s: placenta started to detach    Narrative & Impression  CLINICAL DATA:  Right lower quadrant abdominal pain.   EXAM: CT ABDOMEN AND PELVIS WITH CONTRAST   TECHNIQUE: Multidetector CT imaging of the abdomen and pelvis was performed using the standard protocol following bolus administration of intravenous contrast.   RADIATION DOSE REDUCTION: This exam was performed according to the departmental dose-optimization program which includes automated exposure control, adjustment of the mA and/or kV according to patient size and/or use of iterative reconstruction technique.   CONTRAST:  OMNIPAQUE  IOHEXOL  300 MG/ML  SOLN   COMPARISON:  None Available.   FINDINGS: Lower chest: The lung bases are clear. No pleural effusion. The heart is normal in size. No pericardial effusion.   Hepatobiliary: The liver is normal in size. Non-cirrhotic configuration. No suspicious mass. No intrahepatic or extrahepatic bile duct dilation. There is a single sub 5 mm  gallstone without imaging signs of acute cholecystitis. Normal gallbladder wall thickness. No pericholecystic inflammatory changes.   Pancreas: Unremarkable. No pancreatic ductal dilatation or surrounding inflammatory changes.   Spleen: Within normal limits. No focal lesion.   Adrenals/Urinary Tract: Adrenal glands are unremarkable. No suspicious renal mass. No hydronephrosis. No renal or ureteric calculi. Urinary bladder is under distended, precluding optimal assessment. However, no large mass or stones identified. No perivesical fat stranding.   Stomach/Bowel: No disproportionate dilation of the small or large bowel loops. No evidence of abnormal bowel wall thickening or inflammatory changes. The appendix is unremarkable. There are scattered diverticula mainly in the sigmoid colon, without imaging signs of diverticulitis.   Vascular/Lymphatic: No ascites or pneumoperitoneum. No abdominal or pelvic lymphadenopathy, by size criteria. No aneurysmal dilation of the major abdominal arteries.   Reproductive: The uterus is unremarkable. No large adnexal mass.   Other: There is a tiny fat containing umbilical hernia. The soft tissues and abdominal wall are otherwise unremarkable.   Musculoskeletal: No suspicious osseous lesions.   IMPRESSION: *No acute inflammatory process identified within the abdomen or pelvis. Unremarkable appendix. *Multiple other nonacute observations, as described above.     Electronically Signed   By: Ree Molt M.D.   On: 11/18/2023 16:21       The following portions of the patient's history were reviewed and updated as appropriate: allergies, current medications, past family history, past medical history, past social history, past surgical history, and problem list.  Review of Systems  All other systems reviewed and are negative.   Past medical history, past surgical history, family history and social history were all reviewed and documented  in the EPIC chart.  Exam:  Vitals:   08/10/24 0930  BP: 114/70  Pulse: 90  SpO2: 98%  Weight: 213 lb (96.6 kg)    Body mass index is 34.91 kg/m.  Narrative & Impression  Indication: menorrhagia   Vaginal ultrasound   Anteverted uterus Normal size and shape 9.36 x 4.75 x 4.32cm No myometrial masses     Thin symmetrical endometrium 3.38mm No masses or thickening seen Blood seen in the canal   Both ovaries normal size with normal follicle pattern and perfusion   No adnexal masses   No free fluid   Impression: normal gyn u/s       Assessment/Plan:   1. Menorrhagia with regular cycle (Primary) Discussed management options depending what the u/s shows including OCPs, IUD, Lysteda, ablation, POPs, hysterectomy. Would like to try OCPs to shorten and lighten cycle. Risks and benefits reviewed.  2. Initiation of oral contraception - Norethindrone-Ethinyl Estradiol-Fe Biphas (LO LOESTRIN FE ) 1 MG-10 MCG / 10 MCG tablet; Take 1 tablet by mouth daily.  Dispense: 84 tablet; Refill: 1   Return in about 3 months (around 11/09/2024) for Med Follow-up.  GINETTE COZIER B WHNP-BC 9:41 AM 08/10/2024

## 2024-08-10 NOTE — ED Provider Notes (Signed)
  EMERGENCY DEPARTMENT AT [facility] Provider Note   CSN: [id number] Arrival date & time: 08/10/24  2008     Patient presents with: Headache   Paula [name] is a 33 y.o. female with history of GERD, anemia, hypertension, chronic bilateral back pain.  Patient presents to ED for evaluation of headache.  States that since Sunday she has had persistent headache.  Reports history of headaches but reports that this headache is different because she has pain in her central forehead that radiates to the right side of her neck.  She reports that she had not taken medication until today at 530 when she took Tylenol .  She reports that by 6 PM she got no relief so she presented to ED.  She denies fevers, neck pain.  Reports no nausea or vomiting.  Endorsing photosensitivity, noise sensitivity.  She denies any trauma to her head.  Denies one-sided weakness or numbness.  Denies neck stiffness.   Headache Associated symptoms: no fever and no neck pain        Prior to Admission medications   Medication Sig Start Date End Date Taking? Authorizing Provider  cetirizine  (ZYRTEC ) 10 MG tablet Take 1 tablet by mouth.    [provider]  Cholecalciferol 1.25 MG (50000 UT) capsule Take 1 capsule by mouth. 03/24/23   [provider]  Cyanocobalamin  (B-12 PO) Take by mouth.    [provider]  ferrous sulfate  325 (65 FE) MG tablet Take 325 mg by mouth daily with breakfast.    [provider]  Multiple Vitamin (MULTIVITAMIN WITH MINERALS) TABS tablet Take 1 tablet by mouth daily.    [provider]  Norethindrone-Ethinyl Estradiol-Fe Biphas (LO LOESTRIN FE ) 1 MG-10 MCG / 10 MCG tablet Take 1 tablet by mouth daily. 08/10/24   Chrzanowski, Jami B, NP  Safety Seal Miscellaneous MISC Apply 1 application  topically every morning. Medication Name: Melaxemic Cream No tretinoin  or hydrocortisone 07/01/24   Alm Delon SAILOR, DO  spironolactone   (ALDACTONE ) 100 MG tablet Take 1 tablet (100 mg total) by mouth daily. 07/01/24   Alm Delon SAILOR, DO  tretinoin  (RETIN-A ) 0.025 % cream Apply topically at bedtime. Apply pea size amount to face at bedtime 3 nights per week 07/01/24 07/01/25  Alm Delon SAILOR, DO  VITAMIN D  PO Take 1 tablet by mouth daily. Patient not taking: Reported on 08/10/2024    [provider]  Vitamin D , Ergocalciferol , (DRISDOL ) 1.25 MG (50000 UNIT) CAPS capsule Take 1 capsule (50,000 Units total) by mouth every 7 (seven) days. 06/24/24   Wendolyn Jenkins Jansky, MD  WEGOVY 2.4 MG/0.75ML SOAJ SMARTSIG:2.4 Milligram(s) SUB-Q Once a Week    [provider]    Allergies: Patient has no known allergies.    Review of Systems  Constitutional:  Negative for fever.  Musculoskeletal:  Negative for neck pain.  Neurological:  Positive for headaches.  All other systems reviewed and are negative.   Updated Vital Signs BP (!) 132/94 (BP Location: Right Arm)   Pulse 88   Temp 98.4 F (36.9 C) (Oral)   Resp 18   LMP 08/01/2024   SpO2 98%   Physical Exam Vitals and nursing note reviewed.  Constitutional:      General: She is not in acute distress.    Appearance: She is well-developed.  HENT:     Head: Normocephalic and atraumatic.  Eyes:     Conjunctiva/sclera: Conjunctivae normal.  Neck:     Comments: Full ROM of cervical  spine, no meningismus Cardiovascular:     Rate and Rhythm: Normal rate and regular rhythm.     Heart sounds: No murmur heard. Pulmonary:     Effort: Pulmonary effort is normal. No respiratory distress.     Breath sounds: Normal breath sounds.  Abdominal:     Palpations: Abdomen is soft.     Tenderness: There is no abdominal tenderness.  Musculoskeletal:        General: No swelling.     Cervical back: Neck supple.  Skin:    General: Skin is warm and dry.     Capillary Refill: Capillary refill takes less than 2 seconds.  Neurological:     General: No focal deficit present.      Mental Status: She is alert and oriented to person, place, and time. Mental status is at baseline.     GCS: GCS eye subscore is 4. GCS verbal subscore is 5. GCS motor subscore is 6.     Cranial Nerves: Cranial nerves 2-12 are intact. No cranial nerve deficit.     Sensory: Sensation is intact. No sensory deficit.     Motor: Motor function is intact. No weakness.     Comments: CN III through XII intact.  Intact finger-nose, heel-to-shin.  No pronator drift.  No slurred speech.  Equal strength throughout.  Sensation throughout.  PERRL.  Tracks cross midline.  Ambulates with steady gait.  Psychiatric:        Mood and Affect: Mood normal.     (all labs ordered are listed, but only abnormal results are displayed) Labs Reviewed - No data to display  EKG: None  Radiology: CT Head Wo Contrast Result Date: 08/10/2024 CLINICAL DATA:  Headache, increasing frequency or severity EXAM: CT HEAD WITHOUT CONTRAST TECHNIQUE: Contiguous axial images were obtained from the base of the skull through the vertex without intravenous contrast. RADIATION DOSE REDUCTION: This exam was performed according to the departmental dose-optimization program which includes automated exposure control, adjustment of the mA and/or kV according to patient size and/or use of iterative reconstruction technique. COMPARISON:  CT head 07/17/2022. FINDINGS: Brain: No evidence of acute infarction, hemorrhage, hydrocephalus, extra-axial collection or mass lesion/mass effect. Vascular: No hyperdense vessel. Skull: No acute fracture. Sinuses/Orbits: Clear sinuses.  No acute orbital findings. Other: No mastoid effusions. IMPRESSION: No evidence of acute intracranial abnormality. Electronically Signed   By: Gilmore GORMAN Molt M.D.   On: 08/10/2024 22:45    Procedures   Medications Ordered in the ED  ketorolac  (TORADOL ) 15 MG/ML injection (has no administration in time range)  diphenhydrAMINE  (BENADRYL ) 50 MG/ML injection (has no administration  in time range)  metoCLOPramide  (REGLAN ) 5 MG/ML injection (has no administration in time range)  ketorolac  (TORADOL ) 15 MG/ML injection 15 mg (15 mg Intravenous Given 08/10/24 2252)  metoCLOPramide  (REGLAN ) injection 10 mg (10 mg Intravenous Given 08/10/24 2253)  diphenhydrAMINE  (BENADRYL ) injection 25 mg (25 mg Intravenous Given 08/10/24 2251)    Medical Decision Making Amount and/or Complexity of Data Reviewed Radiology: ordered.  Risk Prescription drug management.   This is a 33 year old female who presents to the ED for evaluation.  On exam, she is hemodynamically stable.  She is afebrile and nontachycardic.  Her lung sounds are clear bilaterally, she is not hypoxic.  Abdomen soft and compressible.  Neuroexam at baseline without focal neurodeficits.  Full ROM of cervical spine.  Patient reports increasing headaches that are not her baseline headaches.  CT scan of head obtained which is unremarkable.  Patient then given  migraine cocktail and on reassessment she reports that her headache has decreased, currently 1 out of 10.  Patient will be discharged at this time and advised to follow-up with her PCP.  She was given a migraine cocktail and as result she is slightly sleepy so her mother came to the department and picked her up.  Patient was given return precautions and she voiced understanding.  She is stable to discharge.   Final diagnoses:  Nonintractable headache, unspecified chronicity pattern, unspecified headache type    ED Discharge Orders     None          Ruthell Lonni JULIANNA DEVONNA 08/11/24 0029    Armenta Canning, MD 08/11/24 [phone]

## 2024-08-10 NOTE — ED Provider Notes (Incomplete)
  EMERGENCY DEPARTMENT AT [facility] Provider Note   CSN: [id number] Arrival date & time: 08/10/24  2008     Patient presents with: Headache   Paula [name] is a 33 y.o. female with history of GERD, anemia, hypertension, chronic bilateral back pain.  Patient presents to ED for evaluation of headache.  States that since Sunday she has had persistent headache.  Reports history of headaches but reports that this headache is different because she has pain in her central forehead that radiates to the right side of her neck.  She reports that she had not taken medication until today at 530 when she took Tylenol .  She reports that by 6 PM she got no relief so she presented to ED.  She denies fevers, neck pain.  Reports no nausea or vomiting.  Endorsing photosensitivity, noise sensitivity.  She denies any trauma to her head.  Denies one-sided weakness or numbness.  Denies neck stiffness.   Headache Associated symptoms: no fever and no neck pain        Prior to Admission medications   Medication Sig Start Date End Date Taking? Authorizing Provider  cetirizine  (ZYRTEC ) 10 MG tablet Take 1 tablet by mouth.    [provider]  Cholecalciferol 1.25 MG (50000 UT) capsule Take 1 capsule by mouth. 03/24/23   [provider]  Cyanocobalamin  (B-12 PO) Take by mouth.    [provider]  ferrous sulfate  325 (65 FE) MG tablet Take 325 mg by mouth daily with breakfast.    [provider]  Multiple Vitamin (MULTIVITAMIN WITH MINERALS) TABS tablet Take 1 tablet by mouth daily.    [provider]  Norethindrone-Ethinyl Estradiol-Fe Biphas (LO LOESTRIN FE ) 1 MG-10 MCG / 10 MCG tablet Take 1 tablet by mouth daily. 08/10/24   Chrzanowski, Jami B, NP  Safety Seal Miscellaneous MISC Apply 1 application  topically every morning. Medication Name: Melaxemic Cream No tretinoin  or hydrocortisone 07/01/24   Alm Delon SAILOR, DO  spironolactone   (ALDACTONE ) 100 MG tablet Take 1 tablet (100 mg total) by mouth daily. 07/01/24   Alm Delon SAILOR, DO  tretinoin  (RETIN-A ) 0.025 % cream Apply topically at bedtime. Apply pea size amount to face at bedtime 3 nights per week 07/01/24 07/01/25  Alm Delon SAILOR, DO  VITAMIN D  PO Take 1 tablet by mouth daily. Patient not taking: Reported on 08/10/2024    [provider]  Vitamin D , Ergocalciferol , (DRISDOL ) 1.25 MG (50000 UNIT) CAPS capsule Take 1 capsule (50,000 Units total) by mouth every 7 (seven) days. 06/24/24   Wendolyn Jenkins Jansky, MD  WEGOVY 2.4 MG/0.75ML SOAJ SMARTSIG:2.4 Milligram(s) SUB-Q Once a Week    [provider]    Allergies: Patient has no known allergies.    Review of Systems  Constitutional:  Negative for fever.  Musculoskeletal:  Negative for neck pain.  Neurological:  Positive for headaches.  All other systems reviewed and are negative.   Updated Vital Signs BP (!) 132/94 (BP Location: Right Arm)   Pulse 88   Temp 98.4 F (36.9 C) (Oral)   Resp 18   LMP 08/01/2024   SpO2 98%   Physical Exam Vitals and nursing note reviewed.  Constitutional:      General: She is not in acute distress.    Appearance: She is well-developed.  HENT:     Head: Normocephalic and atraumatic.  Eyes:     Conjunctiva/sclera: Conjunctivae normal.  Neck:     Comments: Full ROM of cervical  spine, no meningismus Cardiovascular:     Rate and Rhythm: Normal rate and regular rhythm.     Heart sounds: No murmur heard. Pulmonary:     Effort: Pulmonary effort is normal. No respiratory distress.     Breath sounds: Normal breath sounds.  Abdominal:     Palpations: Abdomen is soft.     Tenderness: There is no abdominal tenderness.  Musculoskeletal:        General: No swelling.     Cervical back: Neck supple.  Skin:    General: Skin is warm and dry.     Capillary Refill: Capillary refill takes less than 2 seconds.  Neurological:     General: No focal deficit present.      Mental Status: She is alert and oriented to person, place, and time. Mental status is at baseline.     GCS: GCS eye subscore is 4. GCS verbal subscore is 5. GCS motor subscore is 6.     Cranial Nerves: Cranial nerves 2-12 are intact. No cranial nerve deficit.     Sensory: Sensation is intact. No sensory deficit.     Motor: Motor function is intact. No weakness.     Comments: CN III through XII intact.  Intact finger-nose, heel-to-shin.  No pronator drift.  No slurred speech.  Equal strength throughout.  Sensation throughout.  PERRL.  Tracks cross midline.  Ambulates with steady gait.  Psychiatric:        Mood and Affect: Mood normal.     (all labs ordered are listed, but only abnormal results are displayed) Labs Reviewed - No data to display  EKG: None  Radiology: No results found.  Procedures   Medications Ordered in the ED  ketorolac  (TORADOL ) 15 MG/ML injection 15 mg (has no administration in time range)  metoCLOPramide  (REGLAN ) injection 10 mg (has no administration in time range)  diphenhydrAMINE  (BENADRYL ) injection 25 mg (has no administration in time range)    Medical Decision Making Amount and/or Complexity of Data Reviewed Radiology: ordered.  Risk Prescription drug management.   This is a 33 year old female who presents to the ED for evaluation.  On exam, she is hemodynamically stable.  She is afebrile and nontachycardic.  Her lung sounds are clear bilaterally, she is not hypoxic.  Abdomen soft and compressible.  Neuroexam at baseline without focal neurodeficits.  Full ROM of cervical spine.  Patient reports increasing headaches that are not her baseline headaches.  CT scan of head obtained which is unremarkable.  Patient then given migraine cocktail and on reassessment she reports that her headache has decreased, currently 1 out of 10.  Patient will be discharged at this time and advised to follow-up with her PCP.  She was given a migraine cocktail and as result she  is slightly sleepy so her mother came to the department and picked her up.  Patient was given return precautions and she voiced understanding.  She is stable to discharge.   Final diagnoses:  None    ED Discharge Orders     None

## 2024-08-10 NOTE — Discharge Instructions (Signed)
 It was a pleasure taking part in your care.  As discussed, the CT scan of your head obtained tonight is unremarkable.  Please follow-up with your PCP.  Please continue taking medications as prescribed.  Return to the ED with any new symptoms.

## 2024-08-10 NOTE — ED Triage Notes (Signed)
 Pt is coming in for a persistent headache since Sunday, no nasal congestion or cough but mentions the headache is frontal behind her eyes and moves to the back of her neck on the right side. She also mentions having upper back pain on the right side as well. No chest pain or shortness of breath and is otherwise stable in triage at this time. She took tylenol  around 530pm

## 2024-08-11 ENCOUNTER — Encounter: Payer: Self-pay | Admitting: Family Medicine

## 2024-08-16 ENCOUNTER — Inpatient Hospital Stay: Admitting: Family Medicine

## 2024-08-16 ENCOUNTER — Ambulatory Visit: Payer: Self-pay | Admitting: Nurse Practitioner

## 2024-08-17 ENCOUNTER — Emergency Department (HOSPITAL_BASED_OUTPATIENT_CLINIC_OR_DEPARTMENT_OTHER)
Admission: EM | Admit: 2024-08-17 | Discharge: 2024-08-17 | Disposition: A | Discharge status: Home / Self Care | Attending: Emergency Medicine | Admitting: Emergency Medicine

## 2024-08-17 ENCOUNTER — Other Ambulatory Visit: Payer: Self-pay

## 2024-08-17 ENCOUNTER — Emergency Department (HOSPITAL_BASED_OUTPATIENT_CLINIC_OR_DEPARTMENT_OTHER)

## 2024-08-17 ENCOUNTER — Encounter (HOSPITAL_BASED_OUTPATIENT_CLINIC_OR_DEPARTMENT_OTHER): Payer: Self-pay

## 2024-08-17 DIAGNOSIS — R197 Diarrhea, unspecified: Secondary | ICD-10-CM | POA: Diagnosis not present

## 2024-08-17 DIAGNOSIS — R1110 Vomiting, unspecified: Secondary | ICD-10-CM | POA: Diagnosis present

## 2024-08-17 DIAGNOSIS — K8020 Calculus of gallbladder without cholecystitis without obstruction: Secondary | ICD-10-CM | POA: Diagnosis not present

## 2024-08-17 LAB — URINALYSIS, ROUTINE W REFLEX MICROSCOPIC
Bacteria, UA: NONE SEEN
Glucose, UA: NEGATIVE mg/dL
Hgb urine dipstick: NEGATIVE
Ketones, ur: 15 mg/dL — AB
Nitrite: NEGATIVE
Protein, ur: 100 mg/dL — AB
Specific Gravity, Urine: 1.034 — ABNORMAL HIGH (ref 1.005–1.030)
pH: 6 (ref 5.0–8.0)

## 2024-08-17 LAB — CBC
HCT: 40 % (ref 36.0–46.0)
Hemoglobin: 12.9 g/dL (ref 12.0–15.0)
MCH: 29.4 pg (ref 26.0–34.0)
MCHC: 32.3 g/dL (ref 30.0–36.0)
MCV: 91.1 fL (ref 80.0–100.0)
Platelets: 286 K/uL (ref 150–400)
RBC: 4.39 MIL/uL (ref 3.87–5.11)
RDW: 12.8 % (ref 11.5–15.5)
WBC: 5.2 K/uL (ref 4.0–10.5)
nRBC: 0 % (ref 0.0–0.2)

## 2024-08-17 LAB — COMPREHENSIVE METABOLIC PANEL WITH GFR
ALT: 34 U/L (ref 0–44)
AST: 27 U/L (ref 15–41)
Albumin: 4.5 g/dL (ref 3.5–5.0)
Alkaline Phosphatase: 82 U/L (ref 38–126)
Anion gap: 13 (ref 5–15)
BUN: 8 mg/dL (ref 6–20)
CO2: 22 mmol/L (ref 22–32)
Calcium: 9.8 mg/dL (ref 8.9–10.3)
Chloride: 103 mmol/L (ref 98–111)
Creatinine, Ser: 1.09 mg/dL — ABNORMAL HIGH (ref 0.44–1.00)
GFR, Estimated: >60 mL/min (ref >60)
Glucose, Bld: 93 mg/dL (ref 70–99)
Potassium: 4 mmol/L (ref 3.5–5.1)
Sodium: 138 mmol/L (ref 135–145)
Total Bilirubin: 1 mg/dL (ref 0.0–1.2)
Total Protein: 7.9 g/dL (ref 6.5–8.1)

## 2024-08-17 LAB — LIPASE, BLOOD: Lipase: 30 U/L (ref 11–51)

## 2024-08-17 LAB — PREGNANCY, URINE: Preg Test, Ur: NEGATIVE

## 2024-08-17 MED ORDER — ONDANSETRON HCL 4 MG PO TABS: 4.0000 mg | ORAL_TABLET | Freq: Four times a day (QID) | ORAL | 0 refills | Status: DC

## 2024-08-17 NOTE — ED Provider Notes (Signed)
 [place] EMERGENCY DEPARTMENT AT [facility] Provider Note   CSN: [id number] Arrival date & time: 08/17/24  9164     Patient presents with: Abdominal Pain   Paula [name] is a 33 y.o. female.  33 year old female presents to the ED with complaints of nausea vomiting diarrhea and abdominal pain since Sunday.  Patient reports watery diarrhea increasing in severity since Sunday.  She has had multiple episodes of diarrhea yesterday patient reports 12.  1 episode of vomiting last night with some nausea.  Patient has no history C-section and no other abdominal surgeries.  Patient endorses using GLP-1's and her doctor has concerns of gallstones.  Patient reported right upper quadrant pain yesterday but has since eased.  Patient denies urinary symptoms, fever, rashes, swelling dizziness, headache, chest pain, shortness of breath.     Prior to Admission medications   Medication Sig Start Date End Date Taking? Authorizing Provider  ondansetron  (ZOFRAN ) 4 MG tablet Take 1 tablet (4 mg total) by mouth every 6 (six) hours. 08/17/24  Yes Myriam Fonda RAMAN, PA-C  cetirizine  (ZYRTEC ) 10 MG tablet Take 1 tablet by mouth.    [provider]  Cholecalciferol 1.25 MG (50000 UT) capsule Take 1 capsule by mouth. 03/24/23   [provider]  Cyanocobalamin  (B-12 PO) Take by mouth.    [provider]  ferrous sulfate  325 (65 FE) MG tablet Take 325 mg by mouth daily with breakfast.    [provider]  Multiple Vitamin (MULTIVITAMIN WITH MINERALS) TABS tablet Take 1 tablet by mouth daily.    [provider]  Norethindrone-Ethinyl Estradiol-Fe Biphas (LO LOESTRIN FE ) 1 MG-10 MCG / 10 MCG tablet Take 1 tablet by mouth daily. 08/10/24   Chrzanowski, Jami B, NP  Safety Seal Miscellaneous MISC Apply 1 application  topically every morning. Medication Name: Melaxemic Cream No tretinoin  or hydrocortisone 07/01/24   Alm Delon SAILOR, DO  spironolactone  (ALDACTONE )  100 MG tablet Take 1 tablet (100 mg total) by mouth daily. 07/01/24   Alm Delon SAILOR, DO  tretinoin  (RETIN-A ) 0.025 % cream Apply topically at bedtime. Apply pea size amount to face at bedtime 3 nights per week 07/01/24 07/01/25  Alm Delon SAILOR, DO  Vitamin D , Ergocalciferol , (DRISDOL ) 1.25 MG (50000 UNIT) CAPS capsule Take 1 capsule (50,000 Units total) by mouth every 7 (seven) days. 06/24/24   Wendolyn Jenkins Jansky, MD  WEGOVY 2.4 MG/0.75ML SOAJ SMARTSIG:2.4 Milligram(s) SUB-Q Once a Week    [provider]    Allergies: Patient has no known allergies.    Review of Systems  Gastrointestinal:  Positive for abdominal pain, diarrhea, nausea and vomiting.  All other systems reviewed and are negative.   Updated Vital Signs BP 127/81 (BP Location: Right Arm)   Pulse 72   Temp 98.2 F (36.8 C) (Oral)   Resp 16   Ht 5' 4 (1.626 m)   Wt 93.9 kg   LMP 08/01/2024 (Exact Date)   SpO2 100%   BMI 35.53 kg/m   Physical Exam Vitals and nursing note reviewed.  Constitutional:      Appearance: Normal appearance.  HENT:     Head: Normocephalic and atraumatic.     Nose: Nose normal.  Eyes:     Extraocular Movements: Extraocular movements intact.     Conjunctiva/sclera: Conjunctivae normal.     Pupils: Pupils are equal, round, and reactive to light.  Cardiovascular:     Rate and Rhythm: Normal rate.  Pulmonary:     Effort: Pulmonary  effort is normal. No respiratory distress.  Abdominal:     General: Abdomen is flat. There is no distension. There are no signs of injury.     Tenderness: There is abdominal tenderness in the right upper quadrant and epigastric area. There is no right CVA tenderness or left CVA tenderness. Negative signs include Murphy's sign and McBurney's sign.  Musculoskeletal:        General: Normal range of motion.     Cervical back: Normal range of motion.  Neurological:     General: No focal deficit present.     Mental Status: She is alert.  Psychiatric:         Mood and Affect: Mood normal.        Behavior: Behavior normal.     (all labs ordered are listed, but only abnormal results are displayed) Labs Reviewed  COMPREHENSIVE METABOLIC PANEL WITH GFR - Abnormal; Notable for the following components:      Result Value   Creatinine, Ser 1.09 (*)    All other components within normal limits  URINALYSIS, ROUTINE W REFLEX MICROSCOPIC - Abnormal; Notable for the following components:   APPearance HAZY (*)    Specific Gravity, Urine 1.034 (*)    Bilirubin Urine SMALL (*)    Ketones, ur 15 (*)    Protein, ur 100 (*)    Leukocytes,Ua SMALL (*)    All other components within normal limits  LIPASE, BLOOD  CBC  PREGNANCY, URINE    EKG: None  Radiology: US  Abdomen Limited RUQ (LIVER/GB) Result Date: 08/17/2024 CLINICAL DATA:  Intermittent right upper quadrant pain EXAM: ULTRASOUND ABDOMEN LIMITED RIGHT UPPER QUADRANT COMPARISON:  None Available. FINDINGS: Gallbladder: No wall thickening visualized. Layering sludge and stones within the gallbladder measuring up to 1.0 cm. No sonographic Murphy sign noted by sonographer. Common bile duct: Diameter: 5 mm Liver: No focal lesion identified. Within normal limits in parenchymal echogenicity. Portal vein is patent on color Doppler imaging with normal direction of blood flow towards the liver. Other: None. IMPRESSION: Cholelithiasis without sonographic evidence of acute cholecystitis. Electronically Signed   By: Limin  Xu M.D.   On: 08/17/2024 10:15     Procedures   Medications Ordered in the ED - No data to display  33 y.o. female presents to the ED with complaints of abdominal pain, nausea, vomiting, diarrhea, this involves an extensive number of treatment options, and is a complaint that carries with it a high risk of complications and morbidity.  The differential diagnosis includes appendicitis, cholecystitis, cholelithiasis, pancreatitis, gastritis, GERD, bowel obstruction (Ddx)  On arrival pt is  nontoxic, vitals mildly hypertensive all other vitals unremarkable. Exam significant for mild right upper quadrant pain and some mild tenderness to epigastric  Additional history obtained from chart review with significance of bariatric solutions follows [facility] for [facility]   Lab Tests:  I Ordered, reviewed, and interpreted labs, which included: CMP, CBC, UA, pregnancy  Imaging Studies ordered:  I ordered imaging studies which included right upper quadrant ultrasound, I independently visualized and interpreted imaging which showed cholelithiasis without evidence of cholecystitis or blockage.  ED Course:   Patient sitting comfortably in ED bed in no obvious distress and nontoxic-appearing.  Patient has mild tenderness to palpation in the right upper quadrant and epigastric.  Patient denies current nausea and reports her last episode of vomiting or diarrhea was yesterday.  Her practitioner that prescribes her Georjean has concerns for gallstones.  Patient has negative Murphy's negative McBurney's.  No obvious rashes or  skin changes to abdomen or legs or upper extremity.  Patient has clear lungs auscultation in all fields.  Denies any shortness of breath chest pain, dizziness, weakness, visual disturbances, headache, fever, urinary symptoms.  Initial plan is to get labs for abdominal and urinary etiologies and right upper quadrant ultrasound.  Right upper quadrant ultrasound resulted in cholelithiasis without evidence of acute cholecystitis.  CMP, CBC, lipase unremarkable.  UA has no definitive evidence of UTI at this time.  Patient denies any urinary symptoms.  Patient was able to tolerate food and water  in ED without nausea or vomiting.  Patient was given short course of Zofran  and strict return precautions.  Patient was also given general surgery consult for cholelithiasis.  Patient agrees with treatment plan and comfortable for discharge.  Portions of this note were generated with [employer]bal[employer]. Dictation errors may occur despite best attempts at proofreading.    Final diagnoses:  Calculus of gallbladder without cholecystitis without obstruction  Vomiting and diarrhea    ED Discharge Orders          Ordered    ondansetron  (ZOFRAN ) 4 MG tablet  Every 6 hours        08/17/24 1144               Myriam Fonda RAMAN, [place] 08/17/24 1859    Charlyn Sora, MD 08/21/24 [phone]

## 2024-08-17 NOTE — ED Notes (Signed)
 No N/V after water  or crackers.... Provider informed.SABRASABRA

## 2024-08-17 NOTE — ED Triage Notes (Signed)
 Diarrhea and abd pain onset Sunday. Diarrhea worse yesterday x12. Nausea and vomit x1. Currently has [employer].

## 2024-08-17 NOTE — Discharge Instructions (Addendum)
 Ultrasound showed evidence of stones in your gallbladder.  There is no evidence of inflammation or blockage of the gallbladder at this time.  It is important to monitor for worsening symptoms including severe right upper abdominal pain, fever, worsening vomiting. If these symptoms occur or current symptoms worsen return to ED for further evaluation.  Follow-up with general surgery for further evaluation and management of gallstones.

## 2024-08-17 NOTE — ED Notes (Signed)
 Blood and urine obtained by Triage.SABRASABRA

## 2024-08-17 NOTE — ED Notes (Signed)
 DC paperwork given and verbally understood.

## 2024-08-19 ENCOUNTER — Ambulatory Visit: Admitting: Family Medicine

## 2024-08-23 ENCOUNTER — Ambulatory Visit: Admitting: Family Medicine

## 2024-08-24 ENCOUNTER — Encounter: Payer: Self-pay | Admitting: Family Medicine

## 2024-09-24 ENCOUNTER — Other Ambulatory Visit: Payer: Self-pay | Admitting: Surgery

## 2024-09-26 ENCOUNTER — Other Ambulatory Visit: Payer: Self-pay | Admitting: Dermatology

## 2024-10-11 ENCOUNTER — Encounter: Payer: Self-pay | Admitting: Family Medicine

## 2024-10-22 NOTE — Pre-Procedure Instructions (Signed)
 Surgical Instructions   Your procedure is scheduled on Thursday, December 4th.  Report to [facility] Main Entrance A at 1115 A.M., then check in with the Admitting office. Any questions or running late day of surgery: call [phone]  Questions prior to your surgery date: call [phone], Monday-Friday, 8am-4pm. If you experience any cold or flu symptoms such as cough, fever, chills, shortness of breath, etc. between now and your scheduled surgery, please notify us  at the above number.     Remember:  Do not eat after midnight the night before your surgery   You may drink clear liquids until 1015 the morning of your surgery.   Clear liquids allowed are: Water , Non-Citrus Juices (without pulp), Carbonated Beverages, Clear Tea (no milk, honey, etc.), Black Coffee Only (NO MILK, CREAM OR POWDERED CREAMER of any kind), and Gatorade.  Patient Instructions  The night before surgery:  No food after midnight. ONLY clear liquids after midnight  The day of surgery (if you do NOT have diabetes):  Drink ONE (1) Pre-Surgery Clear Ensure by 1015 the morning of surgery. Drink in one sitting. Do not sip.  This drink was given to you during your hospital  pre-op appointment visit.  Nothing else to drink after completing the  Pre-Surgery Clear Ensure.          If you have questions, please contact your surgeon's office.    Take these medicines the morning of surgery with A SIP OF WATER  Norethindrone-Ethinyl Estradiol-Fe Biphas (LO LOESTRIN FE )   May take these medicines IF NEEDED: none    One week prior to surgery, STOP taking any Aspirin (unless otherwise instructed by your surgeon) Aleve, Naproxen, Ibuprofen , Motrin , Advil , Goody's, BC's, all herbal medications, fish oil, and non-prescription vitamins.                     Do NOT Smoke (Tobacco/Vaping) for 24 hours prior to your procedure.  If you use a CPAP at night, you may bring your mask/headgear for your overnight stay.    You will be asked to remove any contacts, glasses, piercing's, hearing aid's, dentures/partials prior to surgery. Please bring cases for these items if needed.    Patients discharged the day of surgery will not be allowed to drive home, and someone needs to stay with them for 24 hours.  SURGICAL WAITING ROOM VISITATION Patients may have no more than 2 support people in the waiting area - these visitors may rotate.   Pre-op nurse will coordinate an appropriate time for 1 ADULT support person, who may not rotate, to accompany patient in pre-op.  Children under the age of 3 must have an adult with them who is not the patient and must remain in the main waiting area with an adult.  If the patient needs to stay at the hospital during part of their recovery, the visitor guidelines for inpatient rooms apply.  Please refer to the [facility] website for the visitor guidelines for any additional information.   If you received a COVID test during your pre-op visit  it is requested that you wear a mask when out in public, stay away from anyone that may not be feeling well and notify your surgeon if you develop symptoms. If you have been in contact with anyone that has tested positive in the last 10 days please notify you surgeon.      Pre-operative CHG Bathing Instructions   You can play a key role in reducing the risk of  infection after surgery. Your skin needs to be as free of germs as possible. You can reduce the number of germs on your skin by washing with CHG (chlorhexidine gluconate) soap before surgery. CHG is an antiseptic soap that kills germs and continues to kill germs even after washing.   DO NOT use if you have an allergy to chlorhexidine/CHG or antibacterial soaps. If your skin becomes reddened or irritated, stop using the CHG and notify one of our RNs at [phone].              TAKE A SHOWER THE NIGHT BEFORE SURGERY   Please keep in mind the following:  DO NOT shave, including  legs and underarms, 48 hours prior to surgery.   You may shave your face before/day of surgery.  Place clean sheets on your bed the night before surgery Use a clean washcloth (not used since being washed) for shower. DO NOT sleep with pet's night before surgery.  CHG Shower Instructions:  Wash your face and private area with normal soap. If you choose to wash your hair, wash first with your normal shampoo.  After you use shampoo/soap, rinse your hair and body thoroughly to remove shampoo/soap residue.  Turn the water  OFF and apply half the bottle of CHG soap to a CLEAN washcloth.  Apply CHG soap ONLY FROM YOUR NECK DOWN TO YOUR TOES (washing for 3-5 minutes)  DO NOT use CHG soap on face, private areas, open wounds, or sores.  Pay special attention to the area where your surgery is being performed.  If you are having back surgery, having someone wash your back for you may be helpful. Wait 2 minutes after CHG soap is applied, then you may rinse off the CHG soap.  Pat dry with a clean towel  Put on clean pajamas    Additional instructions for the day of surgery: If you choose, you may shower the morning of surgery with an antibacterial soap.  DO NOT APPLY any lotions, deodorants, cologne, or perfumes.   Do not wear jewelry or makeup Do not wear nail polish, gel polish, artificial nails, or any other type of covering on natural nails (fingers and toes) Do not bring valuables to the hospital. [facility] is not responsible for valuables/personal belongings. Put on clean/comfortable clothes.  Please brush your teeth.  Ask your nurse before applying any prescription medications to the skin.

## 2024-10-25 ENCOUNTER — Inpatient Hospital Stay (HOSPITAL_COMMUNITY)
Admission: RE | Admit: 2024-10-25 | Discharge: 2024-10-25 | Disposition: A | Discharge status: Home / Self Care | Source: Ambulatory Visit

## 2024-10-27 ENCOUNTER — Encounter (HOSPITAL_COMMUNITY): Payer: Self-pay | Admitting: Surgery

## 2024-10-27 ENCOUNTER — Other Ambulatory Visit: Payer: Self-pay

## 2024-10-27 ENCOUNTER — Encounter (HOSPITAL_COMMUNITY): Payer: Self-pay

## 2024-10-27 ENCOUNTER — Inpatient Hospital Stay (HOSPITAL_COMMUNITY): Admission: RE | Admit: 2024-10-27 | Discharge: 2024-10-27 | Attending: Surgery

## 2024-10-27 VITALS — BP 126/85 | HR 60 | Temp 98.5°F | Resp 17 | Ht 64.0 in | Wt 225.6 lb

## 2024-10-27 DIAGNOSIS — I2510 Atherosclerotic heart disease of native coronary artery without angina pectoris: Secondary | ICD-10-CM

## 2024-10-27 DIAGNOSIS — Z01818 Encounter for other preprocedural examination: Secondary | ICD-10-CM

## 2024-10-27 LAB — BASIC METABOLIC PANEL WITH GFR
Anion gap: 6 (ref 5–15)
BUN: 6 mg/dL (ref 6–20)
CO2: 30 mmol/L (ref 22–32)
Calcium: 8.6 mg/dL — ABNORMAL LOW (ref 8.9–10.3)
Chloride: 104 mmol/L (ref 98–111)
Creatinine, Ser: 0.88 mg/dL (ref 0.44–1.00)
GFR, Estimated: >60 mL/min (ref >60)
Glucose, Bld: 96 mg/dL (ref 70–99)
Potassium: 3.9 mmol/L (ref 3.5–5.1)
Sodium: 140 mmol/L (ref 135–145)

## 2024-10-27 LAB — CBC
HCT: 36.3 % (ref 36.0–46.0)
Hemoglobin: 11.6 g/dL — ABNORMAL LOW (ref 12.0–15.0)
MCH: 29.5 pg (ref 26.0–34.0)
MCHC: 32 g/dL (ref 30.0–36.0)
MCV: 92.4 fL (ref 80.0–100.0)
Platelets: 260 K/uL (ref 150–400)
RBC: 3.93 MIL/uL (ref 3.87–5.11)
RDW: 12.6 % (ref 11.5–15.5)
WBC: 5.5 K/uL (ref 4.0–10.5)
nRBC: 0 % (ref 0.0–0.2)

## 2024-10-27 NOTE — H&P (Signed)
  Chief Complaint  Patient presents with  RE-CHECK  - Calculus of gallbladder w/o cholecystitis w/o obstruction   Subjective   Paula [name] is a 33 y.o. female who presents for RE-CHECK (- Calculus of gallbladder w/o cholecystitis w/o obstruction/) HPI History of Present Illness Paula [name] is a 33 year old female with gallstones who presents with postprandial gastrointestinal symptoms.  She experiences gastrointestinal symptoms after nearly every meal, including an urgent need to use the bathroom and a sensation of bloating or tightness in the upper abdomen. These symptoms begin approximately 15 to 20 minutes after eating and persist for several hours.  She has been off weight loss medication since July 27, 2024, and is uncertain if her symptoms are related to her gallbladder or the cessation of the medication. No nausea, vomiting, or abdominal pain, but there is a persistent feeling of fullness or tightness in the upper abdomen after meals.  She has a history of at least one gallstone measuring approximately one centimeter.  Review of Systems  There is no problem list on file for this patient.  Outpatient Medications Prior to Visit  Medication Sig Dispense Refill  cholecalciferol (VITAMIN D3) 1,250 mcg (50,000 unit) capsule Take 1 capsule by mouth once a week  cyanocobalamin  (VITAMIN B12) 1000 MCG tablet Take 1,000 mcg by mouth once daily  ferrous sulfate  325 (65 FE) MG tablet Take 325 mg by mouth daily with breakfast  LO LOESTRIN FE  1 mg-10 mcg (24)/10 mcg (2) Take 1 tablet by mouth once daily  spironolactone  (ALDACTONE ) 100 MG tablet Take 100 mg by mouth once daily  tretinoin  (RETIN-A ) 0.025 % cream Apply topically Apply pea size amount to face at bedtime 3 nights per week  WEGOVY 2.4 mg/0.75 mL pen injector SMARTSIG:2.4 Milligram(s) SUB-Q Once a Week   No facility-administered medications prior to visit.     Objective   Vitals:  09/24/24 1027  Pulse: 74   Resp: 16  Temp: 36.4 C (97.6 F)  SpO2: 99%  Weight: 99.8 kg (220 lb)  Height: 162.6 cm (5' 4)  PainSc: 0-No pain   Body mass index is 37.76 kg/m.  Home Vitals:   Physical Exam Physical Exam ABDOMEN: Abdomen non-tender on palpation.  She appears well on exam  Results LABS Vitamin D : Decreased Homocysteine: Elevated  RADIOLOGY Gallbladder ultrasound: At least one cholelith measuring at least 1 cm    Assessment/Plan:   Assessment & Plan Symptomatic gallbladder stones without cholecystitis or obstruction Intermittent postprandial bloating and diarrhea likely due to gallbladder stones. Symptoms consistent with gallbladder dysfunction. Gallbladder contains at least one 1 cm stone. No acute cholecystitis or obstruction. - Discussed laparoscopic cholecystectomy to prevent emergencies. Explained procedure involves small incisions, typically outpatient with same-day discharge.  We discussed the risks of surgery which include but are not limited to bleeding, infection, injury to surrounding structures, the need to convert to an open procedure, bile duct injury, bile leak, DVT, the chance this may not resolve all her issues, post op recovery, etc.  - Reviewed surgery risks: bleeding, infection, 1% risk of open surgery, 10% risk of post-operative diarrhea. - Encouraged surgery consideration to prevent emergencies. Advised to contact office if symptoms worsen or for questions. - Complete paperwork for laparoscopic cholecystectomy scheduling. Expect scheduling call by next week. - Advised no heavy lifting over 15 pounds for two weeks post-surgery. - Instructed to contact office with questions or if symptoms worsen before surgery. Diagnoses and all orders for this visit:  Symptomatic cholelithiasis

## 2024-10-27 NOTE — Progress Notes (Signed)
 PCP - Dr. Jenkins Earnie Carrel Cardiologist - Denies  PPM/ICD - Denies Device Orders - n/a Rep Notified - n/a  Chest x-ray - n/a EKG - 10-27-24 Stress Test - denies ECHO - denies Cardiac Cath - denies  Sleep Study - denies CPAP - n/a  Non-diabetic  Last dose of GLP1 agonist-  denies GLP1 instructions: n/a  Blood Thinner Instructions: denies Aspirin Instructions: denies  ERAS Protcol - clears until 1015 PRE-SURGERY Ensure or G2- Ensure  COVID TEST- n/a   Anesthesia review: No  Patient denies shortness of breath, fever, cough and chest pain at PAT appointment. Patient denies any respiratory issues at this time.    All instructions explained to the patient, with a verbal understanding of the material. Patient agrees to go over the instructions while at home for a better understanding. Patient also instructed to self quarantine after being tested for COVID-19. The opportunity to ask questions was provided.

## 2024-10-28 ENCOUNTER — Ambulatory Visit (HOSPITAL_COMMUNITY)

## 2024-10-28 ENCOUNTER — Ambulatory Visit (HOSPITAL_COMMUNITY): Admission: RE | Admit: 2024-10-28 | Discharge: 2024-10-28 | Disposition: A | Attending: Surgery | Admitting: Surgery

## 2024-10-28 ENCOUNTER — Encounter (HOSPITAL_COMMUNITY): Payer: Self-pay | Admitting: Surgery

## 2024-10-28 ENCOUNTER — Encounter (HOSPITAL_COMMUNITY): Admission: RE | Disposition: A | Payer: Self-pay | Source: Home / Self Care | Attending: Surgery

## 2024-10-28 DIAGNOSIS — Z01818 Encounter for other preprocedural examination: Secondary | ICD-10-CM

## 2024-10-28 HISTORY — PX: CHOLECYSTECTOMY: SHX55

## 2024-10-28 LAB — POCT PREGNANCY, URINE: Preg Test, Ur: NEGATIVE

## 2024-10-28 SURGERY — LAPAROSCOPIC CHOLECYSTECTOMY
Anesthesia: General | Site: Abdomen

## 2024-10-28 MED ORDER — ROCURONIUM BROMIDE 10 MG/ML (PF) SYRINGE
PREFILLED_SYRINGE | INTRAVENOUS | Status: DC | PRN
  Administered 2024-10-28: 50 mg via INTRAVENOUS

## 2024-10-28 MED ORDER — CELECOXIB 200 MG PO CAPS: 200.0000 mg | ORAL_CAPSULE | Freq: Once | ORAL | Status: DC

## 2024-10-28 MED ORDER — ACETAMINOPHEN 500 MG PO TABS
ORAL_TABLET | ORAL | Status: AC
  Administered 2024-10-28: 1000 mg via ORAL
  Filled 2024-10-28: qty 2

## 2024-10-28 MED ORDER — KETOROLAC TROMETHAMINE 30 MG/ML IJ SOLN
INTRAMUSCULAR | Status: DC | PRN
  Administered 2024-10-28: 30 mg via INTRAVENOUS

## 2024-10-28 MED ORDER — LIDOCAINE 2% (20 MG/ML) 5 ML SYRINGE
INTRAMUSCULAR | Status: DC | PRN
  Administered 2024-10-28: 100 mg via INTRAVENOUS

## 2024-10-28 MED ORDER — OXYCODONE HCL 5 MG PO TABS
5.0000 mg | ORAL_TABLET | Freq: Once | ORAL | Status: AC | PRN
  Administered 2024-10-28: 5 mg via ORAL

## 2024-10-28 MED ORDER — OXYCODONE HCL 5 MG PO TABS
ORAL_TABLET | ORAL | Status: AC
  Filled 2024-10-28: qty 1

## 2024-10-28 MED ORDER — SUGAMMADEX SODIUM 200 MG/2ML IV SOLN
INTRAVENOUS | Status: DC | PRN
  Administered 2024-10-28: 204.2 mg via INTRAVENOUS

## 2024-10-28 MED ORDER — FENTANYL CITRATE (PF) 100 MCG/2ML IJ SOLN
25.0000 ug | INTRAMUSCULAR | Status: DC | PRN
  Administered 2024-10-28 (×2): 50 ug via INTRAVENOUS

## 2024-10-28 MED ORDER — MIDAZOLAM HCL 2 MG/2ML IJ SOLN
INTRAMUSCULAR | Status: AC
  Filled 2024-10-28: qty 2

## 2024-10-28 MED ORDER — FENTANYL CITRATE (PF) 250 MCG/5ML IJ SOLN
INTRAMUSCULAR | Status: AC
  Filled 2024-10-28: qty 5

## 2024-10-28 MED ORDER — OXYCODONE HCL 5 MG PO TABS: 5.0000 mg | ORAL_TABLET | Freq: Four times a day (QID) | ORAL | 0 refills | Status: DC | PRN

## 2024-10-28 MED ORDER — PROPOFOL 10 MG/ML IV BOLUS
INTRAVENOUS | Status: AC
  Filled 2024-10-28: qty 20

## 2024-10-28 MED ORDER — DROPERIDOL 2.5 MG/ML IJ SOLN: 0.6250 mg | Freq: Once | INTRAMUSCULAR | Status: DC | PRN

## 2024-10-28 MED ORDER — FENTANYL CITRATE (PF) 100 MCG/2ML IJ SOLN
INTRAMUSCULAR | Status: AC
  Filled 2024-10-28: qty 2

## 2024-10-28 MED ORDER — CHLORHEXIDINE GLUCONATE 0.12 % MT SOLN
OROMUCOSAL | Status: AC
  Administered 2024-10-28: 15 mL via OROMUCOSAL
  Filled 2024-10-28: qty 15

## 2024-10-28 MED ORDER — CEFAZOLIN SODIUM-DEXTROSE 2-4 GM/100ML-% IV SOLN
2.0000 g | INTRAVENOUS | Status: AC
  Administered 2024-10-28: 2 g via INTRAVENOUS

## 2024-10-28 MED ORDER — BUPIVACAINE-EPINEPHRINE (PF) 0.25% -1:200000 IJ SOLN
INTRAMUSCULAR | Status: AC
  Filled 2024-10-28: qty 30

## 2024-10-28 MED ORDER — FENTANYL CITRATE (PF) 250 MCG/5ML IJ SOLN
INTRAMUSCULAR | Status: DC | PRN
  Administered 2024-10-28 (×3): 50 ug via INTRAVENOUS

## 2024-10-28 MED ORDER — 0.9 % SODIUM CHLORIDE (POUR BTL) OPTIME: TOPICAL | Status: DC | PRN

## 2024-10-28 MED ORDER — ORAL CARE MOUTH RINSE: 15.0000 mL | Freq: Once | OROMUCOSAL | Status: AC

## 2024-10-28 MED ORDER — ENSURE PRE-SURGERY PO LIQD: 296.0000 mL | Freq: Once | ORAL | Status: DC

## 2024-10-28 MED ORDER — PHENYLEPHRINE 80 MCG/ML (10ML) SYRINGE FOR IV PUSH (FOR BLOOD PRESSURE SUPPORT)
PREFILLED_SYRINGE | INTRAVENOUS | Status: DC | PRN
  Administered 2024-10-28: 80 ug via INTRAVENOUS

## 2024-10-28 MED ORDER — CHLORHEXIDINE GLUCONATE 0.12 % MT SOLN: 15.0000 mL | Freq: Once | OROMUCOSAL | Status: AC

## 2024-10-28 MED ORDER — PROPOFOL 10 MG/ML IV BOLUS
INTRAVENOUS | Status: DC | PRN
  Administered 2024-10-28: 20 mg via INTRAVENOUS
  Administered 2024-10-28: 180 mg via INTRAVENOUS

## 2024-10-28 MED ORDER — DEXAMETHASONE SOD PHOSPHATE PF 10 MG/ML IJ SOLN
INTRAMUSCULAR | Status: DC | PRN
  Administered 2024-10-28: 10 mg via INTRAVENOUS

## 2024-10-28 MED ORDER — CEFAZOLIN SODIUM-DEXTROSE 2-4 GM/100ML-% IV SOLN
INTRAVENOUS | Status: AC
  Filled 2024-10-28: qty 100

## 2024-10-28 MED ORDER — CHLORHEXIDINE GLUCONATE CLOTH 2 % EX PADS: 6.0000 | MEDICATED_PAD | Freq: Once | CUTANEOUS | Status: DC

## 2024-10-28 MED ORDER — LIDOCAINE 2% (20 MG/ML) 5 ML SYRINGE
INTRAMUSCULAR | Status: AC
  Filled 2024-10-28: qty 5

## 2024-10-28 MED ORDER — LACTATED RINGERS IV SOLN: INTRAVENOUS | Status: DC

## 2024-10-28 MED ORDER — BUPIVACAINE-EPINEPHRINE 0.25% -1:200000 IJ SOLN
INTRAMUSCULAR | Status: DC | PRN
  Administered 2024-10-28: 28 mL

## 2024-10-28 MED ORDER — OXYCODONE HCL 5 MG/5ML PO SOLN: 5.0000 mg | Freq: Once | ORAL | Status: AC | PRN

## 2024-10-28 MED ORDER — ONDANSETRON HCL 4 MG/2ML IJ SOLN
INTRAMUSCULAR | Status: DC | PRN
  Administered 2024-10-28: 4 mg via INTRAVENOUS

## 2024-10-28 MED ORDER — ROCURONIUM BROMIDE 10 MG/ML (PF) SYRINGE
PREFILLED_SYRINGE | INTRAVENOUS | Status: AC
  Filled 2024-10-28: qty 10

## 2024-10-28 MED ORDER — ONDANSETRON HCL 4 MG/2ML IJ SOLN
INTRAMUSCULAR | Status: AC
  Filled 2024-10-28: qty 2

## 2024-10-28 MED ORDER — MIDAZOLAM HCL (PF) 2 MG/2ML IJ SOLN
INTRAMUSCULAR | Status: DC | PRN
  Administered 2024-10-28: 2 mg via INTRAVENOUS

## 2024-10-28 MED ORDER — ACETAMINOPHEN 500 MG PO TABS: 1000.0000 mg | ORAL_TABLET | ORAL | Status: DC

## 2024-10-28 MED ORDER — ACETAMINOPHEN 500 MG PO TABS: 1000.0000 mg | ORAL_TABLET | Freq: Once | ORAL | Status: AC

## 2024-10-28 MED ORDER — SODIUM CHLORIDE 0.9 % IR SOLN
Status: DC | PRN
  Administered 2024-10-28: 1000 mL

## 2024-10-28 SURGICAL SUPPLY — 28 items
BAG COUNTER SPONGE SURGICOUNT (BAG) ×1 IMPLANT
CANISTER SUCTION 3000ML PPV (SUCTIONS) ×1 IMPLANT
CHLORAPREP W/TINT 26 (MISCELLANEOUS) ×1 IMPLANT
CLIP APPLIE 5 13 M/L LIGAMAX5 (MISCELLANEOUS) ×1 IMPLANT
COVER SURGICAL LIGHT HANDLE (MISCELLANEOUS) ×1 IMPLANT
DERMABOND ADVANCED .7 DNX12 (GAUZE/BANDAGES/DRESSINGS) ×1 IMPLANT
ELECTRODE REM PT RTRN 9FT ADLT (ELECTROSURGICAL) ×1 IMPLANT
GLOVE SURG SIGNA 7.5 PF LTX (GLOVE) ×1 IMPLANT
GOWN STRL REUS W/ TWL LRG LVL3 (GOWN DISPOSABLE) ×2 IMPLANT
GOWN STRL REUS W/ TWL XL LVL3 (GOWN DISPOSABLE) ×1 IMPLANT
IRRIGATION SUCT STRKRFLW 2 WTP (MISCELLANEOUS) ×1 IMPLANT
KIT BASIN OR (CUSTOM PROCEDURE TRAY) ×1 IMPLANT
KIT TURNOVER KIT B (KITS) ×1 IMPLANT
PAD ARMBOARD POSITIONER FOAM (MISCELLANEOUS) ×1 IMPLANT
SCISSORS LAP 5X35 DISP (ENDOMECHANICALS) ×1 IMPLANT
SET TUBE SMOKE EVAC HIGH FLOW (TUBING) ×1 IMPLANT
SLEEVE Z-THREAD 5X100MM (TROCAR) ×2 IMPLANT
SOLN 0.9% NACL POUR BTL 1000ML (IV SOLUTION) ×1 IMPLANT
SOLN STERILE WATER BTL 1000 ML (IV SOLUTION) ×1 IMPLANT
SUT MNCRL AB 4-0 PS2 18 (SUTURE) ×1 IMPLANT
SUT VICRYL 0 UR6 27IN ABS (SUTURE) IMPLANT
SYSTEM BAG RETRIEVAL 10MM (BASKET) ×1 IMPLANT
TOWEL GREEN STERILE (TOWEL DISPOSABLE) ×1 IMPLANT
TOWEL GREEN STERILE FF (TOWEL DISPOSABLE) ×1 IMPLANT
TRAY LAPAROSCOPIC MC (CUSTOM PROCEDURE TRAY) ×1 IMPLANT
TROCAR BALLN 12MMX100 BLUNT (TROCAR) ×1 IMPLANT
TROCAR Z-THREAD OPTICAL 5X100M (TROCAR) ×1 IMPLANT
WARMER LAPAROSCOPE (MISCELLANEOUS) ×1 IMPLANT

## 2024-10-28 NOTE — Interval H&P Note (Signed)
 History and Physical Interval Note:no change in H and P  10/28/2024 11:46 AM  Paula [name]  has presented today for surgery, with the diagnosis of SYMPTOMATIC GALLSTONES.  The various methods of treatment have been discussed with the patient and family. After consideration of risks, benefits and other options for treatment, the patient has consented to  Procedure(s): LAPAROSCOPIC CHOLECYSTECTOMY (N/A) as a surgical intervention.  The patient's history has been reviewed, patient examined, no change in status, stable for surgery.  I have reviewed the patient's chart and labs.  Questions were answered to the patient's satisfaction.     Paula [name]

## 2024-10-28 NOTE — Anesthesia Preprocedure Evaluation (Addendum)
 Anesthesia Evaluation  Patient identified by MRN, date of birth, ID band Patient awake    Reviewed: Allergy & Precautions, NPO status , Patient's Chart, lab work & pertinent test results  Airway Mallampati: II  TM Distance: >3 FB Neck ROM: Full    Dental no notable dental hx.    Pulmonary neg pulmonary ROS   Pulmonary exam normal        Cardiovascular hypertension,  Rhythm:Regular Rate:Normal     Neuro/Psych negative neurological ROS  negative psych ROS   GI/Hepatic Neg liver ROS,GERD  ,,Gallstones    Endo/Other  negative endocrine ROS    Renal/GU negative Renal ROS  negative genitourinary   Musculoskeletal negative musculoskeletal ROS (+)    Abdominal Normal abdominal exam  (+)   Peds  Hematology  (+) Blood dyscrasia, anemia Lab Results      Component                Value               Date                      WBC                      5.5                 10/27/2024                HGB                      11.6 (L)            10/27/2024                HCT                      36.3                10/27/2024                MCV                      92.4                10/27/2024                PLT                      260                 10/27/2024             Lab Results      Component                Value               Date                      NA                       140                 10/27/2024                K  3.9                 10/27/2024                CO2                      30                  10/27/2024                GLUCOSE                  96                  10/27/2024                BUN                      6                   10/27/2024                CREATININE               0.88                10/27/2024                CALCIUM                   8.6 (L)             10/27/2024                GFR                      91.24               06/23/2024                EGFR                      77                  05/15/2023                GFRNONAA                 >60                 10/27/2024              Anesthesia Other Findings   Reproductive/Obstetrics                              Anesthesia Physical Anesthesia Plan  ASA: 2  Anesthesia Plan: General   Post-op Pain Management: Tylenol  PO (pre-op)* and Celebrex  PO (pre-op)*   Induction: Intravenous  PONV Risk Score and Plan: 3 and Ondansetron , Dexamethasone , Midazolam  and Treatment may vary due to age or medical condition  Airway Management Planned: Mask and Oral ETT  Additional Equipment: None  Intra-op Plan:   Post-operative Plan: Extubation in OR  Informed Consent: I have reviewed the patients History and Physical, chart, labs and discussed the procedure including the risks, benefits and alternatives for the proposed anesthesia with the patient or authorized representative who has indicated his/her understanding and acceptance.     Dental  advisory given  Plan Discussed with: CRNA  Anesthesia Plan Comments:         Anesthesia Quick Evaluation

## 2024-10-28 NOTE — Transfer of Care (Signed)
 Immediate Anesthesia Transfer of Care Note  Patient: Paula [name]  Procedure(s) Performed: LAPAROSCOPIC CHOLECYSTECTOMY (Abdomen)  Patient Location: PACU  Anesthesia Type:General  Level of Consciousness: awake, oriented, and drowsy  Airway & Oxygen  Therapy: Patient Spontanous Breathing  Post-op Assessment: Report given to RN, Post -op Vital signs reviewed and stable, and Patient moving all extremities  Post vital signs: Reviewed and stable  Last Vitals:  Vitals Value Taken Time  BP 145/91 10/28/24 14:15  Temp    Pulse 80 10/28/24 14:17  Resp 24 10/28/24 14:17  SpO2 96 % 10/28/24 14:17  Vitals shown include unfiled device data.  Last Pain:  Vitals:   10/28/24 1113  TempSrc: Oral         Complications: No notable events documented.

## 2024-10-28 NOTE — Op Note (Signed)
 Laparoscopic Cholecystectomy Procedure Note  Indications: This patient presents with symptomatic gallbladder disease and will undergo laparoscopic cholecystectomy.  Pre-operative Diagnosis: symptomatic cholelthiasis  Post-operative Diagnosis: same  Surgeon: Vicenta Poli MD  Assistants: Marolyn Big, MD [facility] Resident  Anesthesia: General endotracheal anesthesia  ASA Class: 2  Procedure Details  The patient was seen again in the Holding Room. The risks, benefits, complications, treatment options, and expected outcomes were discussed with the patient. The possibilities of reaction to medication, pulmonary aspiration, perforation of viscus, bleeding, recurrent infection, finding a normal gallbladder, the need for additional procedures, failure to diagnose a condition, the possible need to convert to an open procedure, and creating a complication requiring transfusion or operation were discussed with the patient. The likelihood of improving the patient's symptoms with return to their baseline status is good.  The patient and/or family concurred with the proposed plan, giving informed consent. The site of surgery properly noted. The patient was taken to Operating Room, identified as Paula [name] and the procedure verified as Laparoscopic Cholecystectomy with Intraoperative Cholangiogram. A Time Out was held and the above information confirmed.  Prior to the induction of general anesthesia, antibiotic prophylaxis was administered. General endotracheal anesthesia was then administered and tolerated well. After the induction, the abdomen was prepped with Chloraprep and draped in sterile fashion. The patient was positioned in the supine position.  Local anesthetic agent was injected into the skin near the umbilicus and an incision made. We dissected down to the abdominal fascia with blunt dissection.  The fascia was incised vertically and we entered the peritoneal cavity bluntly.  A  pursestring suture of 0-Vicryl was placed around the fascial opening.  The Hasson cannula was inserted and secured with the stay suture.  Pneumoperitoneum was then created with CO2 and tolerated well without any adverse changes in the patient's vital signs. A 5-mm port was placed in the subxiphoid position.  Two 5-mm ports were placed in the right upper quadrant. All skin incisions were infiltrated with a local anesthetic agent before making the incision and placing the trocars.   We positioned the patient in reverse Trendelenburg, tilted slightly to the patient's left.  The gallbladder was identified and looked mildly thick walled and liver appeared normal.  the fundus grasped and retracted cephalad. Adhesions were lysed bluntly and with the electrocautery where indicated, taking care not to injure any adjacent organs or viscus. The infundibulum was grasped and retracted laterally, exposing the peritoneum overlying the triangle of Calot. This was then divided and exposed in a blunt fashion. The cystic duct was clearly identified and bluntly dissected circumferentially. A critical view of the cystic duct and cystic artery was obtained.  The cystic duct was then ligated with clips and divided. The cystic artery was, dissected free, ligated with clips and divided as well.   The gallbladder was dissected from the liver bed in retrograde fashion with the electrocautery. The gallbladder was removed and placed in an Endocatch sac. The liver bed was irrigated and inspected. Hemostasis was achieved with the electrocautery. Copious irrigation was utilized and was repeatedly aspirated until clear.  The gallbladder and Endocatch sac were then removed through the umbilical port site.  The pursestring suture was used to close the umbilical fascia.    We again inspected the right upper quadrant for hemostasis.  Pneumoperitoneum was released as we removed the trocars.  4-0 Monocryl was used to close the skin.   Skin glue was  then applied. The patient was then extubated and  brought to the recovery room in stable condition. Instrument, sponge, and needle counts were correct at closure and at the conclusion of the case.   Findings: Mild chronic Cholecystitis with Cholelithiasis. Normal appearing liver  Estimated Blood Loss: Minimal         Drains: none         Specimens: Gallbladder           Complications: None; patient tolerated the procedure well.         Disposition: PACU - hemodynamically stable.         Condition: stable

## 2024-10-28 NOTE — Discharge Instructions (Signed)
[facility] ______[facility] LAPAROSCOPIC SURGERY: POST OP INSTRUCTIONS Always review your discharge instruction sheet given to you by the facility where your surgery was performed. IF YOU HAVE DISABILITY OR FAMILY LEAVE FORMS, YOU MUST BRING THEM TO THE OFFICE FOR PROCESSING.   DO NOT GIVE THEM TO YOUR DOCTOR.  A prescription for pain medication may be given to you upon discharge.  Take your pain medication as prescribed, if needed.  If narcotic pain medicine is not needed, then you may take acetaminophen (Tylenol) or ibuprofen (Advil) as needed. Take your usually prescribed medications unless otherwise directed. If you need a refill on your pain medication, please contact your pharmacy.  They will contact our office to request authorization. Prescriptions will not be filled after 5pm or on week-ends. You should follow a light diet the first few days after arrival home, such as soup and crackers, etc.  Be sure to include lots of fluids daily. Most patients will experience some swelling and bruising in the area of the incisions.  Ice packs will help.  Swelling and bruising can take several days to resolve.  It is common to experience some constipation if taking pain medication after surgery.  Increasing fluid intake and taking a stool softener (such as Colace) will usually help or prevent this problem from occurring.  A mild laxative (Milk of Magnesia or Miralax) should be taken according to package instructions if there are no bowel movements after 48 hours. Unless discharge instructions indicate otherwise, you may remove your bandages 24-48 hours after surgery, and you may shower at that time.  You may have steri-strips (small skin tapes) in place directly over the incision.  These strips should be left on the skin for 7-10 days.  If your surgeon used skin glue on the incision, you may shower in 24 hours.  The glue will flake off over the next 2-3 weeks.  Any sutures or staples will be  removed at the office during your follow-up visit. ACTIVITIES:  You may resume regular (light) daily activities beginning the next day--such as daily self-care, walking, climbing stairs--gradually increasing activities as tolerated.  You may have sexual intercourse when it is comfortable.  Refrain from any heavy lifting or straining until approved by your doctor. You may drive when you are no longer taking prescription pain medication, you can comfortably wear a seatbelt, and you can safely maneuver your car and apply brakes. RETURN TO WORK:  __________________________________________________________ You should see your doctor in the office for a follow-up appointment approximately 2-3 weeks after your surgery.  Make sure that you call for this appointment within a day or two after you arrive home to insure a convenient appointment time. OTHER INSTRUCTIONS: YOU MAY SHOWER STARTING TOMORROW ICE PACK, TYLENOL, AND IBUPROFEN ALSO FOR PAIN NO LIFTING MORE THAN 15 POUNDS FOR 2 WEEKS __________________________________________________________________________________________________________________________ __________________________________________________________________________________________________________________________ WHEN TO CALL YOUR DOCTOR: Fever over 101.0 Inability to urinate Continued bleeding from incision. Increased pain, redness, or drainage from the incision. Increasing abdominal pain  The clinic staff is available to answer your questions during regular business hours.  Please don't hesitate to call and ask to speak to one of the nurses for clinical concerns.  If you have a medical emergency, go to the nearest emergency room or call 911.  A surgeon from [facility] is always on call at the hospital. [address], [address], [place], [place]  [address] ? [address] [address], [place], [place]   [address] [phone] ? [phone] ?   FAX [phone] Web site:  [email or web address] 

## 2024-10-28 NOTE — Anesthesia Procedure Notes (Signed)
 Procedure Name: Intubation Date/Time: 10/28/2024 1:09 PM  Performed by: Jerl Donald LABOR, CRNAPre-anesthesia Checklist: Patient identified, Emergency Drugs available, Suction available and Patient being monitored Patient Re-evaluated:Patient Re-evaluated prior to induction Oxygen  Delivery Method: Circle System Utilized Preoxygenation: Pre-oxygenation with 100% oxygen  Induction Type: IV induction Ventilation: Mask ventilation without difficulty Laryngoscope Size: Mac and 3 Grade View: Grade I Tube type: Oral Tube size: 7.0 mm Number of attempts: 1 Airway Equipment and Method: Stylet Placement Confirmation: ETT inserted through vocal cords under direct vision, positive ETCO2 and breath sounds checked- equal and bilateral Secured at: 21 cm Tube secured with: Tape Dental Injury: Teeth and Oropharynx as per pre-operative assessment

## 2024-10-29 ENCOUNTER — Encounter (HOSPITAL_COMMUNITY): Payer: Self-pay | Admitting: Surgery

## 2024-10-29 NOTE — Anesthesia Postprocedure Evaluation (Signed)
 Anesthesia Post Note  Patient: Paula [name]  Procedure(s) Performed: LAPAROSCOPIC CHOLECYSTECTOMY (Abdomen)     Patient location during evaluation: PACU Anesthesia Type: General Level of consciousness: awake and alert Pain management: pain level controlled Vital Signs Assessment: post-procedure vital signs reviewed and stable Respiratory status: spontaneous breathing, nonlabored ventilation, respiratory function stable and patient connected to nasal cannula oxygen  Cardiovascular status: blood pressure returned to baseline and stable Postop Assessment: no apparent nausea or vomiting Anesthetic complications: no   No notable events documented.  Last Vitals:  Vitals:   10/28/24 1445 10/28/24 1500  BP: 113/62 131/84  Pulse: 77 71  Resp: 17 20  Temp:  37 C  SpO2: 99% 100%    Last Pain:  Vitals:   10/28/24 1500  TempSrc:   PainSc: Asleep                 Cordella P Aurianna Earlywine

## 2024-11-01 LAB — SURGICAL PATHOLOGY

## 2024-11-09 ENCOUNTER — Ambulatory Visit: Admitting: Radiology

## 2024-11-10 ENCOUNTER — Ambulatory Visit: Admitting: Dermatology

## 2024-11-18 ENCOUNTER — Encounter: Payer: Self-pay | Admitting: Family Medicine

## 2024-12-04 ENCOUNTER — Other Ambulatory Visit: Payer: Self-pay | Admitting: Family Medicine

## 2024-12-06 ENCOUNTER — Encounter: Payer: Self-pay | Admitting: *Deleted

## 2024-12-07 ENCOUNTER — Encounter: Payer: Self-pay | Admitting: Family Medicine

## 2024-12-07 ENCOUNTER — Ambulatory Visit: Admitting: Family Medicine

## 2024-12-07 VITALS — BP 132/86 | HR 68 | Temp 97.9°F | Ht 64.0 in | Wt 233.0 lb

## 2024-12-07 DIAGNOSIS — M67442 Ganglion, left hand: Secondary | ICD-10-CM

## 2024-12-07 NOTE — Progress Notes (Signed)
 "  Subjective:     Patient ID: Paula [name], female    DOB: 03-16-1991, 34 y.o.   MRN: [id number]  Chief Complaint  Patient presents with   Hand Pain    Pts left pinky finger has a bump on that she wants to get checked out    Discussed the use of AI scribe software for clinical note transcription with the patient, who gave verbal consent to proceed.  History of Present Illness Paula [name] is a 34 year old female who presents with a soft, movable mass on the left palmar side of her pinky finger.  She noticed the mass on November 04, 2024, shortly after her gallbladder surgery on October 28, 2024. Initially, the mass was painful when pressed or when gripping objects, but not when bending the finger. Over time, the mass has decreased in size to a couple of millimeters and is now painless when pressed. It remains soft and movable.  No history of injury to the finger. The mass appeared after her gallbladder surgery, during which she had an IV in the same arm. She does not believe the IV is related to the mass. She can move the mass around freely.  She is concerned about the nature of the mass, wondering if it could be a cyst.    Health Maintenance Due  Topic Date Due   HPV VACCINES (2 - Risk 3-dose series) 05/18/2014   Influenza Vaccine  06/25/2024    Past Medical History:  Diagnosis Date   Anemia    Chlamydia 2011   GERD (gastroesophageal reflux disease)    History of Papanicolaou smear of cervix    Hypertension    Gestational   Low iron    Medical history non-contributory    Sciatica     Past Surgical History:  Procedure Laterality Date   CESAREAN SECTION N/A 05/28/2016   Procedure: CESAREAN SECTION;  Surgeon: Nena App, MD;  Location: [facility];  Service: Obstetrics;  Laterality: N/A;   CESAREAN SECTION WITH BILATERAL TUBAL LIGATION Bilateral 03/08/2022   Procedure: CESAREAN SECTION WITH BILATERAL TUBAL LIGATION;  Surgeon: Ozan,  Jennifer, DO;  Location: [facility];  Service: Obstetrics;  Laterality: Bilateral;   CHOLECYSTECTOMY N/A 10/28/2024   Procedure: LAPAROSCOPIC CHOLECYSTECTOMY;  Surgeon: Vernetta Berg, MD;  Location: [facility];  Service: General;  Laterality: N/A;   TUBAL LIGATION     Tubes removed   WISDOM TOOTH EXTRACTION  06/2024    Current Medications[1]  Allergies[2] ROS neg/noncontributory except as noted HPI/below      Objective:     BP 132/86 (BP Location: Left Arm, Patient Position: Sitting, Cuff Size: Large)   Pulse 68   Temp 97.9 F (36.6 C) (Temporal)   Ht 5' 4 (1.626 m)   Wt 233 lb (105.7 kg)   LMP 11/30/2024 (Exact Date)   SpO2 98%   BMI 39.99 kg/m  Wt Readings from Last 3 Encounters:  12/07/24 233 lb (105.7 kg)  10/28/24 225 lb (102.1 kg)  10/27/24 225 lb 9.6 oz (102.3 kg)    Physical Exam GENERAL: Well developed, well nourished, no acute distress. HEAD EYES EARS NOSE THROAT: Normocephalic, atraumatic, conjunctiva not injected, sclera nonicteric. MUSCULOSKELETAL: No gross abnormalities. Small, mobile, nt, firm cyst on left palmar PIP joint, a few millimeters in size. NEUROLOGICAL: Alert and oriented x3, cranial nerves II through XII intact. PSYCHIATRIC: Normal mood, good eye contact.       Assessment & Plan:  Ganglion cyst of flexor tendon  sheath of finger, left    Assessment and Plan Assessment & Plan  cyst of left hand   A  cyst is present on the left palmar PIP joint area, likely on the tendon or nerve. Initially, it caused pain when pressing or grabbing objects but is now asymptomatic. Differential diagnosis includes a ganglion cyst on the nerve or tendon, or a cyst from a previous sliver. There are no signs of malignancy, blood clot, or arthritis. It currently does not cause significant discomfort or functional impairment. Monitor for changes in size, pain, or symptoms. Avoid manipulating the cyst to prevent irritation. If symptoms worsen or the cyst enlarges,  consider referral to sports medicine for ultrasound evaluation and possible injection. If it becomes significantly bothersome, consider referral to orthopedics for potential removal.     Return for as sch 2/3 for annual.  Jenkins CHRISTELLA Carrel, MD     [1]  Current Outpatient Medications:    Cyanocobalamin  (B-12 PO), Take 1,000 mcg by mouth daily., Disp: , Rfl:    ferrous sulfate  325 (65 FE) MG tablet, Take 325 mg by mouth daily with breakfast., Disp: , Rfl:    Norethindrone-Ethinyl Estradiol-Fe Biphas (LO LOESTRIN FE ) 1 MG-10 MCG / 10 MCG tablet, Take 1 tablet by mouth daily., Disp: 84 tablet, Rfl: 1   ondansetron  (ZOFRAN ) 4 MG tablet, Take 1 tablet (4 mg total) by mouth every 6 (six) hours., Disp: 12 tablet, Rfl: 0   oxyCODONE  (OXY IR/ROXICODONE ) 5 MG immediate release tablet, Take 1 tablet (5 mg total) by mouth every 6 (six) hours as needed for moderate pain (pain score 4-6) or severe pain (pain score 7-10)., Disp: 25 tablet, Rfl: 0   Safety Seal Miscellaneous MISC, Apply 1 application  topically every morning. Medication Name: Melaxemic Cream No tretinoin  or hydrocortisone, Disp: 15 g, Rfl: 3   spironolactone  (ALDACTONE ) 100 MG tablet, TAKE 1 TABLET BY MOUTH EVERY DAY, Disp: 90 tablet, Rfl: 1   tretinoin  (RETIN-A ) 0.025 % cream, Apply topically at bedtime. Apply pea size amount to face at bedtime 3 nights per week, Disp: 45 g, Rfl: 3   Vitamin D , Ergocalciferol , (DRISDOL ) 1.25 MG (50000 UNIT) CAPS capsule, TAKE 1 CAPSULE (50,000 UNITS TOTAL) BY MOUTH EVERY 7 (SEVEN) DAYS, Disp: 12 capsule, Rfl: 0 [2] No Known Allergies  "

## 2024-12-07 NOTE — Patient Instructions (Signed)
 Cyst.  monitor

## 2024-12-21 ENCOUNTER — Encounter: Payer: Self-pay | Admitting: Family Medicine

## 2024-12-28 ENCOUNTER — Encounter: Payer: Self-pay | Admitting: Family Medicine

## 2024-12-28 ENCOUNTER — Ambulatory Visit: Admitting: Family Medicine

## 2024-12-28 VITALS — BP 136/74 | HR 82 | Temp 97.2°F | Ht 64.0 in | Wt 233.0 lb

## 2024-12-28 DIAGNOSIS — Z0000 Encounter for general adult medical examination without abnormal findings: Secondary | ICD-10-CM

## 2024-12-28 DIAGNOSIS — E559 Vitamin D deficiency, unspecified: Secondary | ICD-10-CM

## 2024-12-28 DIAGNOSIS — D500 Iron deficiency anemia secondary to blood loss (chronic): Secondary | ICD-10-CM | POA: Diagnosis not present

## 2024-12-28 DIAGNOSIS — E538 Deficiency of other specified B group vitamins: Secondary | ICD-10-CM | POA: Diagnosis not present

## 2024-12-28 LAB — CBC WITH DIFFERENTIAL/PLATELET
Basophils Absolute: 0 10*3/uL (ref 0.0–0.1)
Basophils Relative: 0.5 % (ref 0.0–3.0)
Eosinophils Absolute: 0 10*3/uL (ref 0.0–0.7)
Eosinophils Relative: 0.8 % (ref 0.0–5.0)
HCT: 37.6 % (ref 36.0–46.0)
Hemoglobin: 12.4 g/dL (ref 12.0–15.0)
Lymphocytes Relative: 42.2 % (ref 12.0–46.0)
Lymphs Abs: 2 10*3/uL (ref 0.7–4.0)
MCHC: 33 g/dL (ref 30.0–36.0)
MCV: 90.4 fl (ref 78.0–100.0)
Monocytes Absolute: 0.4 10*3/uL (ref 0.1–1.0)
Monocytes Relative: 7.6 % (ref 3.0–12.0)
Neutro Abs: 2.4 10*3/uL (ref 1.4–7.7)
Neutrophils Relative %: 48.9 % (ref 43.0–77.0)
Platelets: 240 10*3/uL (ref 150.0–400.0)
RBC: 4.16 Mil/uL (ref 3.87–5.11)
RDW: 13.1 % (ref 11.5–15.5)
WBC: 4.8 10*3/uL (ref 4.0–10.5)

## 2024-12-28 LAB — IBC + FERRITIN
Ferritin: 22.8 ng/mL (ref 10.0–291.0)
Iron: 48 ug/dL (ref 42–145)
Saturation Ratios: 11.5 % — ABNORMAL LOW (ref 20.0–50.0)
TIBC: 415.8 ug/dL (ref 250.0–450.0)
Transferrin: 297 mg/dL (ref 212.0–360.0)

## 2024-12-28 LAB — COMPREHENSIVE METABOLIC PANEL WITH GFR
ALT: 12 U/L (ref 3–35)
AST: 16 U/L (ref 5–37)
Albumin: 4 g/dL (ref 3.5–5.2)
Alkaline Phosphatase: 56 U/L (ref 39–117)
BUN: 10 mg/dL (ref 6–23)
CO2: 28 meq/L (ref 19–32)
Calcium: 9.5 mg/dL (ref 8.4–10.5)
Chloride: 105 meq/L (ref 96–112)
Creatinine, Ser: 0.93 mg/dL (ref 0.40–1.20)
GFR: 79.31 mL/min
Glucose, Bld: 92 mg/dL (ref 70–99)
Potassium: 4 meq/L (ref 3.5–5.1)
Sodium: 141 meq/L (ref 135–145)
Total Bilirubin: 0.4 mg/dL (ref 0.2–1.2)
Total Protein: 7.1 g/dL (ref 6.0–8.3)

## 2024-12-28 LAB — LIPID PANEL
Cholesterol: 233 mg/dL — ABNORMAL HIGH (ref 28–200)
HDL: 55.5 mg/dL
LDL Cholesterol: 158 mg/dL — ABNORMAL HIGH (ref 10–99)
NonHDL: 177.67
Total CHOL/HDL Ratio: 4
Triglycerides: 99 mg/dL (ref 10.0–149.0)
VLDL: 19.8 mg/dL (ref 0.0–40.0)

## 2024-12-28 LAB — VITAMIN B12: Vitamin B-12: 427 pg/mL (ref 211–911)

## 2024-12-28 LAB — VITAMIN D 25 HYDROXY (VIT D DEFICIENCY, FRACTURES): VITD: 24.94 ng/mL — ABNORMAL LOW (ref 30.00–100.00)

## 2024-12-28 LAB — HEMOGLOBIN A1C: Hgb A1c MFr Bld: 5.6 % (ref 4.6–6.5)

## 2024-12-28 LAB — TSH: TSH: 2 u[IU]/mL (ref 0.35–5.50)

## 2024-12-28 NOTE — Telephone Encounter (Signed)
 Per review of 08/10/25 OV, last PAP 11/03/2023, ASCUS, neg HPV Started on LoLoestrin Fe, return for 3 mo med follow-up   Jami -please advise on next PAP

## 2024-12-28 NOTE — Telephone Encounter (Signed)
 Needs AEX scheduled, pap due 2027. ASCUS HPV neg

## 2024-12-28 NOTE — Patient Instructions (Signed)

## 2024-12-29 ENCOUNTER — Ambulatory Visit: Payer: Self-pay | Admitting: Family Medicine

## 2024-12-29 ENCOUNTER — Other Ambulatory Visit: Payer: Self-pay | Admitting: Family Medicine

## 2024-12-29 DIAGNOSIS — E7800 Pure hypercholesterolemia, unspecified: Secondary | ICD-10-CM

## 2024-12-29 DIAGNOSIS — E66812 Obesity, class 2: Secondary | ICD-10-CM

## 2024-12-29 MED ORDER — WEGOVY 0.25 MG/0.5ML ~~LOC~~ SOAJ: 0.2500 mg | SUBCUTANEOUS | 0 refills | Status: AC

## 2024-12-29 NOTE — Progress Notes (Signed)
"   Vitamin d  improving.  When done with the prescription, take otc 2000iu/day B12 improved-continue Hgb stable.  Continue iron every other day Cholesterol unchanged.  If willing, start crestor 10mg  daily #90 and repeat lipids,lft's in 3 months(before run out of meds "

## 2024-12-29 NOTE — Progress Notes (Signed)
 Spoke to patient directly. Understands lab results. Want to wait on medication until she does some research. Will sent MyChart message if she wants to start meds.

## 2025-01-04 ENCOUNTER — Ambulatory Visit: Admitting: Radiology

## 2025-06-27 ENCOUNTER — Ambulatory Visit: Admitting: Family Medicine

## 2025-12-29 ENCOUNTER — Encounter: Admitting: Family Medicine
